# Patient Record
Sex: Female | Born: 1940 | Race: Black or African American | Hispanic: No | Smoking: Former smoker
Health system: Southern US, Community
[De-identification: ages and names within clinical notes are randomized; demographics above are authoritative.]

## PROBLEM LIST (undated history)

## (undated) DIAGNOSIS — F419 Anxiety disorder, unspecified: Secondary | ICD-10-CM

## (undated) DIAGNOSIS — E669 Obesity, unspecified: Secondary | ICD-10-CM

## (undated) DIAGNOSIS — K449 Diaphragmatic hernia without obstruction or gangrene: Secondary | ICD-10-CM

## (undated) DIAGNOSIS — R0609 Other forms of dyspnea: Secondary | ICD-10-CM

## (undated) DIAGNOSIS — E7800 Pure hypercholesterolemia, unspecified: Secondary | ICD-10-CM

## (undated) DIAGNOSIS — Z720 Tobacco use: Secondary | ICD-10-CM

## (undated) DIAGNOSIS — K5790 Diverticulosis of intestine, part unspecified, without perforation or abscess without bleeding: Secondary | ICD-10-CM

## (undated) DIAGNOSIS — R0600 Dyspnea, unspecified: Secondary | ICD-10-CM

## (undated) DIAGNOSIS — M1990 Unspecified osteoarthritis, unspecified site: Secondary | ICD-10-CM

## (undated) DIAGNOSIS — D649 Anemia, unspecified: Secondary | ICD-10-CM

## (undated) DIAGNOSIS — I10 Essential (primary) hypertension: Secondary | ICD-10-CM

## (undated) DIAGNOSIS — I4891 Unspecified atrial fibrillation: Secondary | ICD-10-CM

## (undated) HISTORY — DX: Essential (primary) hypertension: I10

## (undated) HISTORY — DX: Tobacco use: Z72.0

## (undated) HISTORY — DX: Obesity, unspecified: E66.9

## (undated) HISTORY — PX: ABDOMINAL HYSTERECTOMY: SHX81

## (undated) HISTORY — DX: Unspecified osteoarthritis, unspecified site: M19.90

## (undated) HISTORY — DX: Pure hypercholesterolemia, unspecified: E78.00

## (undated) HISTORY — DX: Other forms of dyspnea: R06.09

## (undated) HISTORY — DX: Diaphragmatic hernia without obstruction or gangrene: K44.9

## (undated) HISTORY — DX: Diverticulosis of intestine, part unspecified, without perforation or abscess without bleeding: K57.90

## (undated) HISTORY — DX: Anemia, unspecified: D64.9

## (undated) HISTORY — DX: Anxiety disorder, unspecified: F41.9

## (undated) HISTORY — DX: Dyspnea, unspecified: R06.00

---

## 2012-07-09 DIAGNOSIS — R0602 Shortness of breath: Secondary | ICD-10-CM

## 2012-09-27 ENCOUNTER — Encounter: Payer: Self-pay | Admitting: Physician Assistant

## 2012-09-27 ENCOUNTER — Other Ambulatory Visit: Payer: Self-pay | Admitting: Physician Assistant

## 2012-09-28 DIAGNOSIS — R079 Chest pain, unspecified: Secondary | ICD-10-CM

## 2012-10-11 ENCOUNTER — Encounter: Payer: Self-pay | Admitting: *Deleted

## 2012-10-16 ENCOUNTER — Encounter: Payer: PRIVATE HEALTH INSURANCE | Admitting: Physician Assistant

## 2012-11-02 ENCOUNTER — Ambulatory Visit (INDEPENDENT_AMBULATORY_CARE_PROVIDER_SITE_OTHER): Payer: Medicare Other | Admitting: Physician Assistant

## 2012-11-02 ENCOUNTER — Other Ambulatory Visit: Payer: Self-pay | Admitting: *Deleted

## 2012-11-02 ENCOUNTER — Encounter: Payer: Self-pay | Admitting: *Deleted

## 2012-11-02 ENCOUNTER — Encounter: Payer: Self-pay | Admitting: Physician Assistant

## 2012-11-02 ENCOUNTER — Telehealth: Payer: Self-pay | Admitting: Cardiology

## 2012-11-02 VITALS — BP 148/69 | HR 66 | Ht 64.0 in | Wt 244.0 lb

## 2012-11-02 DIAGNOSIS — F17200 Nicotine dependence, unspecified, uncomplicated: Secondary | ICD-10-CM

## 2012-11-02 DIAGNOSIS — R072 Precordial pain: Secondary | ICD-10-CM

## 2012-11-02 DIAGNOSIS — R0609 Other forms of dyspnea: Secondary | ICD-10-CM | POA: Insufficient documentation

## 2012-11-02 DIAGNOSIS — R002 Palpitations: Secondary | ICD-10-CM

## 2012-11-02 DIAGNOSIS — Z720 Tobacco use: Secondary | ICD-10-CM

## 2012-11-02 DIAGNOSIS — I10 Essential (primary) hypertension: Secondary | ICD-10-CM

## 2012-11-02 NOTE — Assessment & Plan Note (Signed)
Patient has since stopped smoking 

## 2012-11-02 NOTE — Telephone Encounter (Signed)
GXT Cardiolite on medications  Scheduled for 11-20-12 at [facility]

## 2012-11-02 NOTE — Progress Notes (Signed)
Primary Cardiologist: Simona Huh, MD (new)   HPI: Post hospital followup from [facility], status post evaluation for new-onset exertional chest pain. Patient presented with no prior history of heart disease. Serial cardiac markers all within NL. Echocardiogram normal, with recommendation to pursue further evaluation with an outpatient stress test.  Patient presents today reporting no change from her baseline. She continues to experience DOE, even when walking on level ground. She also complains of near daily palpitations, for which she had a negative event monitor several years ago, in [place], [place].  Of note, patient has since stopped smoking tobacco.  Allergies  Allergen Reactions  . Shellfish Allergy     Current Outpatient Prescriptions  Medication Sig Dispense Refill  . amLODipine (NORVASC) 10 MG tablet Take 10 mg by mouth daily.      Marland Kitchen aspirin 81 MG tablet Take 81 mg by mouth every other day.      . clonazePAM (KLONOPIN) 0.5 MG tablet Take 0.5 mg by mouth 2 (two) times daily as needed.      . pantoprazole (PROTONIX) 40 MG tablet Take 40 mg by mouth daily.        Past Medical History  Diagnosis Date  . Hypertension   . Osteoarthritis   . Anxiety disorder   . Hypercholesterolemia   . Obesity   . Anemia     secondary to acute on chronic GI bleed  . Hiatal hernia   . Diverticulosis   . Exertional dyspnea     Normal LVF, 2-D echo, 09/2012  . Tobacco abuse     Past Surgical History  Procedure Date  . Abdominal hysterectomy     History   Social History  . Marital Status: Married    Spouse Name: N/A    Number of Children: N/A  . Years of Education: N/A   Occupational History  . Not on file.   Social History Main Topics  . Smoking status: Former Smoker -- 0.5 packs/day for 20 years    Types: Cigarettes    Quit date: 06/22/2012  . Smokeless tobacco: Not on file  . Alcohol Use: No  . Drug Use: No  . Sexually Active: Not on file   Other Topics Concern  . Not  on file   Social History Narrative  . No narrative on file    No family history on file.  ROS: no nausea, vomiting; no fever, chills; no melena, hematochezia; no claudication  PHYSICAL EXAM: BP 148/69  Pulse 66  Ht 5\' 4"  (1.626 m)  Wt 244 lb (110.678 kg)  BMI 41.88 kg/m2 GENERAL: 71 year old female, moderately obese; NAD HEENT: NCAT, PERRLA, EOMI; sclera clear; no xanthelasma NECK: palpable bilateral carotid pulses, no bruits; no JVD; no TM LUNGS: CTA bilaterally CARDIAC: RRR (S1, S2); no significant murmurs; no rubs or gallops ABDOMEN: Protuberant EXTREMETIES: no significant peripheral edema SKIN: warm/dry; no obvious rash/lesions MUSCULOSKELETAL: no joint deformity NEURO: no focal deficit; NL affect   EKG:    ASSESSMENT & PLAN:  Exertional dyspnea Will proceed with recommended outpatient evaluation with a GXT Cardiolite, for risk stratification. Patient's persistent DOE may represent an anginal equivalent. Recent hospitalization notable for normal cardiac markers and a normal echocardiogram. Patient has no known CAD, and had a prior negative GXT Cardiolite several years ago, in [place], [place].  Palpitations We'll further evaluate with an event monitor (x1 week). Also check TSH level.  Hypertension Followed by primary M.D.  Tobacco abuse Patient has since stopped smoking    Gene Charita Lindenberger, PAC

## 2012-11-02 NOTE — Assessment & Plan Note (Signed)
Followed by primary M.D. 

## 2012-11-02 NOTE — Assessment & Plan Note (Signed)
Will proceed with recommended outpatient evaluation with a GXT Cardiolite, for risk stratification. Patient's persistent DOE may represent an anginal equivalent. Recent hospitalization notable for normal cardiac markers and a normal echocardiogram. Patient has no known CAD, and had a prior negative GXT Cardiolite several years ago, in [place], [place].

## 2012-11-02 NOTE — Assessment & Plan Note (Signed)
We'll further evaluate with an event monitor (x1 week). Also check TSH level.

## 2012-11-02 NOTE — Patient Instructions (Addendum)
Your physician recommends that you schedule a follow-up appointment in: 1 month. Your physician recommends that you continue on your current medications as directed. Please refer to the Current Medication list given to you today.  Your physician recommends that you return for lab work today for [facility]. Your physician has recommended that you wear an event monitor for 1 week. You will be contacted directly by [employer] about this monitor.Event monitors are medical devices that record the heart's electrical activity. Doctors most often [place] these monitors to diagnose arrhythmias. Arrhythmias are problems with the speed or rhythm of the heartbeat. The monitor is a small, portable device. You can wear one while you do your normal daily activities. This is usually used to diagnose what is causing palpitations/syncope (passing out). Your physician has requested that you have en exercise stress myoview. For further information please visit [email or web address] Please follow instruction sheet, as given.

## 2012-11-03 NOTE — Telephone Encounter (Signed)
No precert required 

## 2012-11-13 ENCOUNTER — Telehealth: Payer: Self-pay | Admitting: *Deleted

## 2012-11-13 NOTE — Telephone Encounter (Signed)
Left message for patient to call office.  

## 2012-11-20 DIAGNOSIS — R079 Chest pain, unspecified: Secondary | ICD-10-CM

## 2012-11-23 NOTE — Telephone Encounter (Signed)
Left message for patient to call office r/e whether or not she plans to wear heart monitor.

## 2012-11-27 ENCOUNTER — Encounter: Payer: Self-pay | Admitting: *Deleted

## 2012-11-27 NOTE — Telephone Encounter (Signed)
Left message for patient to call office.  

## 2012-11-30 NOTE — Telephone Encounter (Signed)
Unable to reach patient. No return call from patient.

## 2012-12-07 ENCOUNTER — Ambulatory Visit: Payer: Medicare Other | Admitting: Physician Assistant

## 2012-12-13 ENCOUNTER — Ambulatory Visit (INDEPENDENT_AMBULATORY_CARE_PROVIDER_SITE_OTHER): Payer: Medicare Other | Admitting: Physician Assistant

## 2012-12-13 ENCOUNTER — Encounter: Payer: Self-pay | Admitting: Physician Assistant

## 2012-12-13 VITALS — BP 150/72 | HR 79 | Ht 64.0 in | Wt 213.0 lb

## 2012-12-13 DIAGNOSIS — R002 Palpitations: Secondary | ICD-10-CM

## 2012-12-13 DIAGNOSIS — I10 Essential (primary) hypertension: Secondary | ICD-10-CM

## 2012-12-13 DIAGNOSIS — R0609 Other forms of dyspnea: Secondary | ICD-10-CM

## 2012-12-13 NOTE — Patient Instructions (Signed)
   7 day heart monitor  Office will contact with results Continue all current medications. Follow up as needed

## 2012-12-13 NOTE — Assessment & Plan Note (Signed)
Patient has agreed to proceed with our recommendation to wear an event monitor (x1 week), for further evaluation of her persistent palpitations. If this is unrevealing, then no further cardiac workup is recommended, and she can return to Dr. Diona Browner on an as needed basis. I also reported to her that her recent TSH level was normal.

## 2012-12-13 NOTE — Assessment & Plan Note (Signed)
I reviewed the results of the recent NL, adequate exercise stress test. I also reviewed her recent NL echocardiogram. Therefore, no further cardiac workup indicated.

## 2012-12-13 NOTE — Progress Notes (Signed)
Primary Cardiologist: Simona Huh, MD   HPI: Scheduled one-month followup. When last seen, I scheduled her for an exercise stress Cardiolite, event monitor (one week), and a TSH level, which was normal.   - Normal adequate (90% PMHR) GXT Cardiolite; EF 73%  She informs me today that she decided to not wear the monitor, although she did receive the device. She ended up leaving town and elected to not take it with her. Upon her return, she mailed back the device.  Clinically, she continues to report significant DOE, as well as daily palpitations.  Allergies  Allergen Reactions  . Shellfish Allergy     Current Outpatient Prescriptions  Medication Sig Dispense Refill  . amLODipine (NORVASC) 10 MG tablet Take 10 mg by mouth daily.      Marland Kitchen aspirin 81 MG tablet Take 81 mg by mouth every other day.      . clonazePAM (KLONOPIN) 0.5 MG tablet Take 0.5 mg by mouth 2 (two) times daily as needed.      . pantoprazole (PROTONIX) 40 MG tablet Take 40 mg by mouth daily.        Past Medical History  Diagnosis Date  . Hypertension   . Osteoarthritis   . Anxiety disorder   . Hypercholesterolemia   . Obesity   . Anemia     secondary to acute on chronic GI bleed  . Hiatal hernia   . Diverticulosis   . Exertional dyspnea     Normal LVF, 2-D echo, 09/2012  . Tobacco abuse     Past Surgical History  Procedure Date  . Abdominal hysterectomy     History   Social History  . Marital Status: Married    Spouse Name: N/A    Number of Children: N/A  . Years of Education: N/A   Occupational History  . Not on file.   Social History Main Topics  . Smoking status: Former Smoker -- 0.5 packs/day for 20 years    Types: Cigarettes    Quit date: 06/22/2012  . Smokeless tobacco: Not on file  . Alcohol Use: No  . Drug Use: No  . Sexually Active: Not on file   Other Topics Concern  . Not on file   Social History Narrative  . No narrative on file    No family history on file.  ROS: no  nausea, vomiting; no fever, chills; no melena, hematochezia; no claudication  PHYSICAL EXAM: BP 150/72  Pulse 79  Ht 5\' 4"  (1.626 m)  Wt 213 lb (96.616 kg)  BMI 36.56 kg/m2 GENERAL: 72 year old female, moderately obese; NAD  HEENT: NCAT, PERRLA, EOMI; sclera clear; no xanthelasma  NECK: palpable bilateral carotid pulses, no bruits; no JVD; no TM  LUNGS: CTA bilaterally  CARDIAC: RRR (S1, S2); no significant murmurs; no rubs or gallops  ABDOMEN: Protuberant  EXTREMETIES: no significant peripheral edema  SKIN: warm/dry; no obvious rash/lesions  MUSCULOSKELETAL: no joint deformity  NEURO: no focal deficit; NL affect    EKG:    ASSESSMENT & PLAN:  Palpitations Patient has agreed to proceed with our recommendation to wear an event monitor (x1 week), for further evaluation of her persistent palpitations. If this is unrevealing, then no further cardiac workup is recommended, and she can return to Dr. Diona Browner on an as needed basis. I also reported to her that her recent TSH level was normal.  Exertional dyspnea I reviewed the results of the recent NL, adequate exercise stress test. I also reviewed her recent NL echocardiogram. Therefore,  no further cardiac workup indicated.  Hypertension Followed by primary M.D.    Gene Andrea [name], PAC

## 2012-12-13 NOTE — Assessment & Plan Note (Signed)
Followed by primary M.D. 

## 2012-12-15 ENCOUNTER — Other Ambulatory Visit: Payer: Self-pay | Admitting: *Deleted

## 2012-12-15 DIAGNOSIS — R002 Palpitations: Secondary | ICD-10-CM

## 2012-12-20 DIAGNOSIS — R002 Palpitations: Secondary | ICD-10-CM

## 2012-12-29 ENCOUNTER — Telehealth: Payer: Self-pay | Admitting: *Deleted

## 2012-12-29 MED ORDER — METOPROLOL TARTRATE 25 MG PO TABS: 25.0000 mg | ORAL_TABLET | Freq: Two times a day (BID) | ORAL | Status: DC

## 2012-12-29 NOTE — Telephone Encounter (Signed)
E-CARDIO - End of Service Report - per Gene Serpe, PA - NSR w/ brief atrial run ~150 bpm.  Recommend starting Lopressor 25mg  twice a day  & follow up clinic in 2 weeks.    Patient notified of above.  New rx sent to [facility] / [place].  OV scheduled with Dr. Shirlee Latch for 3/19.  (Also put on wait list for earlier OV with MD).

## 2013-02-01 DIAGNOSIS — D509 Iron deficiency anemia, unspecified: Secondary | ICD-10-CM

## 2013-02-07 ENCOUNTER — Other Ambulatory Visit (INDEPENDENT_AMBULATORY_CARE_PROVIDER_SITE_OTHER): Payer: Self-pay | Admitting: *Deleted

## 2013-02-07 ENCOUNTER — Ambulatory Visit: Payer: Medicare Other | Admitting: Cardiology

## 2013-02-07 DIAGNOSIS — D509 Iron deficiency anemia, unspecified: Secondary | ICD-10-CM

## 2013-02-08 ENCOUNTER — Encounter (HOSPITAL_COMMUNITY): Payer: Self-pay | Admitting: Pharmacy Technician

## 2013-02-14 ENCOUNTER — Encounter (INDEPENDENT_AMBULATORY_CARE_PROVIDER_SITE_OTHER): Payer: Self-pay

## 2013-02-15 ENCOUNTER — Encounter: Payer: Medicare Other | Admitting: Internal Medicine

## 2013-02-15 DIAGNOSIS — F329 Major depressive disorder, single episode, unspecified: Secondary | ICD-10-CM

## 2013-02-15 DIAGNOSIS — J40 Bronchitis, not specified as acute or chronic: Secondary | ICD-10-CM

## 2013-02-15 DIAGNOSIS — D509 Iron deficiency anemia, unspecified: Secondary | ICD-10-CM

## 2013-02-15 DIAGNOSIS — J329 Chronic sinusitis, unspecified: Secondary | ICD-10-CM

## 2013-02-19 ENCOUNTER — Encounter (HOSPITAL_COMMUNITY): Payer: Self-pay | Admitting: *Deleted

## 2013-02-19 ENCOUNTER — Encounter (HOSPITAL_COMMUNITY)
Admission: RE | Disposition: A | Discharge status: Home / Self Care | Payer: Self-pay | Source: Ambulatory Visit | Attending: Internal Medicine

## 2013-02-19 ENCOUNTER — Ambulatory Visit (HOSPITAL_COMMUNITY)
Admission: RE | Admit: 2013-02-19 | Discharge: 2013-02-19 | Disposition: A | Discharge status: Home / Self Care | Payer: Medicare Other | Source: Ambulatory Visit | Attending: Internal Medicine | Admitting: Internal Medicine

## 2013-02-19 DIAGNOSIS — I10 Essential (primary) hypertension: Secondary | ICD-10-CM | POA: Insufficient documentation

## 2013-02-19 DIAGNOSIS — Z86010 Personal history of colonic polyps: Secondary | ICD-10-CM

## 2013-02-19 DIAGNOSIS — D509 Iron deficiency anemia, unspecified: Secondary | ICD-10-CM | POA: Insufficient documentation

## 2013-02-19 DIAGNOSIS — K2960 Other gastritis without bleeding: Secondary | ICD-10-CM

## 2013-02-19 DIAGNOSIS — K319 Disease of stomach and duodenum, unspecified: Secondary | ICD-10-CM

## 2013-02-19 DIAGNOSIS — Z9889 Other specified postprocedural states: Secondary | ICD-10-CM

## 2013-02-19 DIAGNOSIS — K922 Gastrointestinal hemorrhage, unspecified: Secondary | ICD-10-CM

## 2013-02-19 HISTORY — PX: GIVENS CAPSULE STUDY: SHX5432

## 2013-02-19 SURGERY — IMAGING PROCEDURE, GI TRACT, INTRALUMINAL, VIA CAPSULE

## 2013-02-19 NOTE — H&P (Signed)
Andrea [name] is an 72 y.o. female.   Chief Complaint: Patient is here for small bowel given capsule study. HPI: Patient is 72 year old Caucasian female with history of iron deficiency anemia and GI bleed. She had EGD and colonoscopy by Dr. Leota Jacobsen in August 2013. EGD revealed Schatzki's ring but no bleeding lesions identified. She had 8 polyps removed from her colon once again no bleeding source was identified. Last month she was given transfusion for hemoglobin of 6.4 g. She was evaluated by Dr. Earma Reading and sent for this study to complete  evaluation for iron deficiency anemia. She has history of GI bleed.  Past Medical History  Diagnosis Date  . Hypertension   . Osteoarthritis   . Anxiety disorder   . Hypercholesterolemia   . Obesity   . Anemia     secondary to acute on chronic GI bleed  . Hiatal hernia   . Diverticulosis   . Exertional dyspnea     Normal LVF, 2-D echo, 09/2012  . Tobacco abuse     Past Surgical History  Procedure Laterality Date  . Abdominal hysterectomy      History reviewed. No pertinent family history. Social History:  reports that she quit smoking about 7 months ago. Her smoking use included Cigarettes. She has a 10 pack-year smoking history. She does not have any smokeless tobacco history on file. She reports that she does not drink alcohol or use illicit drugs.  Allergies:  Allergies  Allergen Reactions  . Shellfish Allergy Hives, Itching and Swelling    No prescriptions prior to admission    No results found for this or any previous visit (from the past 48 hour(s)). No results found.  ROS  Blood pressure 169/91, pulse 59, temperature 99.1 F (37.3 C), temperature source Oral, resp. rate 18, height 5\' 4"  (1.626 m), weight 210 lb (95.255 kg), SpO2 96.00%. Physical Exam   Assessment/Plan Iron deficiency anemia and history of GI bleed. No bleeding lesions found on EGD and colonoscopy of August 2013. Will proceed with small bowel  given capsule study.  Coyt Govoni U 02/19/2013, 12:36 PM

## 2013-02-20 ENCOUNTER — Encounter (HOSPITAL_COMMUNITY): Payer: Self-pay | Admitting: Internal Medicine

## 2013-02-25 NOTE — Op Note (Signed)
Small Bowel Givens Capsule Study Procedure date:  02/19/2013  Referring Provider:  Dr. Scotty Court, MD PCP:  Dr. Ignatius Specking., MD  Indication for procedure:  Patient is 72 year old female with history of iron deficiency anemia and GI bleed and no source was identified on EGD of August and November 2013 and colonoscopy of August 2013. These studies were performed at [facility] in 8 in [place]. In February 2014 her hemoglobin dropped to 6.4 grams and she required transfusion.    Findings:  Patient able to swallow given capsule without any difficulty. This is an and 8 hour study. First image of oral cavity at 3 minutes and 4 seconds. Single erosion noted at gastric body without stigmata of bleed. 4 submucosal lesions noted; these are best seen on frames at 1 hour 3 minutes and 12 seconds; 1 hour 3 minutes and 37 seconds; 1 hour 19 minutes and 25 seconds and the fourth one was at 1 hour 22 minutes and 37 seconds and involved half of the circumference.  First Gastric image:  18 secs. First Duodenal image: 16 m and 44 secs. First Ileo-Cecal Valve image: 2 h  41 m and 10 secs First Cecal image: 2 h 41 m and 11 secs Gastric Passage time:  13 m and 14 secs Small Bowel Passage time:  2 h 36 m  Summary & Recommendations: Single erosion at gastric body without stigmata of bleed. No lesion identified involving small bowel mucosa. However four submucosal lesions noted as above. These lesions are felt to be incidental but should be further evaluated with CT enterography. These findings were reviewed with the patient over the phone and my office will arrange for CT enterography. Should patient experience another episode of overt bleeding she should undergo urgent bleeding scan.   CC; Dr. Earma Reading, MD

## 2013-03-10 ENCOUNTER — Telehealth (INDEPENDENT_AMBULATORY_CARE_PROVIDER_SITE_OTHER): Payer: Self-pay | Admitting: Internal Medicine

## 2013-03-10 NOTE — Telephone Encounter (Signed)
No answer Will call patient tomorrow

## 2013-03-16 ENCOUNTER — Ambulatory Visit: Payer: Medicare Other | Admitting: Cardiology

## 2013-03-26 ENCOUNTER — Encounter (INDEPENDENT_AMBULATORY_CARE_PROVIDER_SITE_OTHER): Payer: Self-pay

## 2013-03-28 ENCOUNTER — Encounter (INDEPENDENT_AMBULATORY_CARE_PROVIDER_SITE_OTHER): Payer: Self-pay

## 2013-03-29 ENCOUNTER — Encounter: Payer: Medicare Other | Admitting: Internal Medicine

## 2013-03-29 DIAGNOSIS — D649 Anemia, unspecified: Secondary | ICD-10-CM

## 2013-03-29 DIAGNOSIS — D509 Iron deficiency anemia, unspecified: Secondary | ICD-10-CM

## 2013-04-04 DIAGNOSIS — D509 Iron deficiency anemia, unspecified: Secondary | ICD-10-CM

## 2013-04-10 ENCOUNTER — Ambulatory Visit (INDEPENDENT_AMBULATORY_CARE_PROVIDER_SITE_OTHER): Payer: Medicare Other | Admitting: Internal Medicine

## 2013-04-13 DIAGNOSIS — D509 Iron deficiency anemia, unspecified: Secondary | ICD-10-CM

## 2013-04-20 DIAGNOSIS — D509 Iron deficiency anemia, unspecified: Secondary | ICD-10-CM

## 2013-05-29 DIAGNOSIS — R05 Cough: Secondary | ICD-10-CM

## 2013-05-29 DIAGNOSIS — D509 Iron deficiency anemia, unspecified: Secondary | ICD-10-CM

## 2013-05-29 DIAGNOSIS — K219 Gastro-esophageal reflux disease without esophagitis: Secondary | ICD-10-CM

## 2013-05-29 DIAGNOSIS — K449 Diaphragmatic hernia without obstruction or gangrene: Secondary | ICD-10-CM

## 2013-06-07 DIAGNOSIS — D509 Iron deficiency anemia, unspecified: Secondary | ICD-10-CM

## 2013-07-24 ENCOUNTER — Ambulatory Visit (INDEPENDENT_AMBULATORY_CARE_PROVIDER_SITE_OTHER): Payer: Medicare Other | Admitting: Internal Medicine

## 2013-07-31 DIAGNOSIS — K449 Diaphragmatic hernia without obstruction or gangrene: Secondary | ICD-10-CM

## 2013-07-31 DIAGNOSIS — D509 Iron deficiency anemia, unspecified: Secondary | ICD-10-CM

## 2013-07-31 DIAGNOSIS — R799 Abnormal finding of blood chemistry, unspecified: Secondary | ICD-10-CM

## 2013-10-30 DIAGNOSIS — R42 Dizziness and giddiness: Secondary | ICD-10-CM

## 2013-10-30 DIAGNOSIS — K449 Diaphragmatic hernia without obstruction or gangrene: Secondary | ICD-10-CM

## 2013-10-30 DIAGNOSIS — Z23 Encounter for immunization: Secondary | ICD-10-CM

## 2013-10-30 DIAGNOSIS — D509 Iron deficiency anemia, unspecified: Secondary | ICD-10-CM

## 2014-03-25 DIAGNOSIS — D500 Iron deficiency anemia secondary to blood loss (chronic): Secondary | ICD-10-CM | POA: Diagnosis present

## 2014-07-08 DIAGNOSIS — G4730 Sleep apnea, unspecified: Secondary | ICD-10-CM

## 2014-07-08 HISTORY — DX: Sleep apnea, unspecified: G47.30

## 2014-10-07 DIAGNOSIS — I517 Cardiomegaly: Secondary | ICD-10-CM

## 2014-10-07 HISTORY — DX: Cardiomegaly: I51.7

## 2015-11-26 DIAGNOSIS — M6281 Muscle weakness (generalized): Secondary | ICD-10-CM | POA: Diagnosis not present

## 2015-11-26 DIAGNOSIS — M160 Bilateral primary osteoarthritis of hip: Secondary | ICD-10-CM | POA: Diagnosis not present

## 2015-11-26 DIAGNOSIS — R2689 Other abnormalities of gait and mobility: Secondary | ICD-10-CM | POA: Diagnosis not present

## 2015-11-27 DIAGNOSIS — M160 Bilateral primary osteoarthritis of hip: Secondary | ICD-10-CM | POA: Diagnosis not present

## 2015-11-27 DIAGNOSIS — R2689 Other abnormalities of gait and mobility: Secondary | ICD-10-CM | POA: Diagnosis not present

## 2015-11-27 DIAGNOSIS — M6281 Muscle weakness (generalized): Secondary | ICD-10-CM | POA: Diagnosis not present

## 2015-12-02 DIAGNOSIS — M6281 Muscle weakness (generalized): Secondary | ICD-10-CM | POA: Diagnosis not present

## 2015-12-02 DIAGNOSIS — R2689 Other abnormalities of gait and mobility: Secondary | ICD-10-CM | POA: Diagnosis not present

## 2015-12-02 DIAGNOSIS — M160 Bilateral primary osteoarthritis of hip: Secondary | ICD-10-CM | POA: Diagnosis not present

## 2015-12-04 DIAGNOSIS — M6281 Muscle weakness (generalized): Secondary | ICD-10-CM | POA: Diagnosis not present

## 2015-12-04 DIAGNOSIS — M160 Bilateral primary osteoarthritis of hip: Secondary | ICD-10-CM | POA: Diagnosis not present

## 2015-12-04 DIAGNOSIS — R2689 Other abnormalities of gait and mobility: Secondary | ICD-10-CM | POA: Diagnosis not present

## 2015-12-10 DIAGNOSIS — M6281 Muscle weakness (generalized): Secondary | ICD-10-CM | POA: Diagnosis not present

## 2015-12-10 DIAGNOSIS — M160 Bilateral primary osteoarthritis of hip: Secondary | ICD-10-CM | POA: Diagnosis not present

## 2015-12-10 DIAGNOSIS — R2689 Other abnormalities of gait and mobility: Secondary | ICD-10-CM | POA: Diagnosis not present

## 2015-12-11 DIAGNOSIS — M6281 Muscle weakness (generalized): Secondary | ICD-10-CM | POA: Diagnosis not present

## 2015-12-11 DIAGNOSIS — R2689 Other abnormalities of gait and mobility: Secondary | ICD-10-CM | POA: Diagnosis not present

## 2015-12-11 DIAGNOSIS — M160 Bilateral primary osteoarthritis of hip: Secondary | ICD-10-CM | POA: Diagnosis not present

## 2015-12-16 DIAGNOSIS — M160 Bilateral primary osteoarthritis of hip: Secondary | ICD-10-CM | POA: Diagnosis not present

## 2015-12-16 DIAGNOSIS — M6281 Muscle weakness (generalized): Secondary | ICD-10-CM | POA: Diagnosis not present

## 2015-12-16 DIAGNOSIS — R2689 Other abnormalities of gait and mobility: Secondary | ICD-10-CM | POA: Diagnosis not present

## 2015-12-18 DIAGNOSIS — M160 Bilateral primary osteoarthritis of hip: Secondary | ICD-10-CM | POA: Diagnosis not present

## 2015-12-18 DIAGNOSIS — R2689 Other abnormalities of gait and mobility: Secondary | ICD-10-CM | POA: Diagnosis not present

## 2015-12-18 DIAGNOSIS — M6281 Muscle weakness (generalized): Secondary | ICD-10-CM | POA: Diagnosis not present

## 2015-12-23 DIAGNOSIS — M160 Bilateral primary osteoarthritis of hip: Secondary | ICD-10-CM | POA: Diagnosis not present

## 2015-12-23 DIAGNOSIS — M6281 Muscle weakness (generalized): Secondary | ICD-10-CM | POA: Diagnosis not present

## 2015-12-23 DIAGNOSIS — R2689 Other abnormalities of gait and mobility: Secondary | ICD-10-CM | POA: Diagnosis not present

## 2015-12-25 DIAGNOSIS — M160 Bilateral primary osteoarthritis of hip: Secondary | ICD-10-CM | POA: Diagnosis not present

## 2015-12-25 DIAGNOSIS — R2689 Other abnormalities of gait and mobility: Secondary | ICD-10-CM | POA: Diagnosis not present

## 2015-12-25 DIAGNOSIS — M6281 Muscle weakness (generalized): Secondary | ICD-10-CM | POA: Diagnosis not present

## 2016-01-13 DIAGNOSIS — M160 Bilateral primary osteoarthritis of hip: Secondary | ICD-10-CM | POA: Diagnosis not present

## 2016-01-13 DIAGNOSIS — R2689 Other abnormalities of gait and mobility: Secondary | ICD-10-CM | POA: Diagnosis not present

## 2016-01-13 DIAGNOSIS — M6281 Muscle weakness (generalized): Secondary | ICD-10-CM | POA: Diagnosis not present

## 2016-01-14 DIAGNOSIS — D500 Iron deficiency anemia secondary to blood loss (chronic): Secondary | ICD-10-CM | POA: Diagnosis not present

## 2016-01-14 DIAGNOSIS — K210 Gastro-esophageal reflux disease with esophagitis: Secondary | ICD-10-CM | POA: Diagnosis not present

## 2016-01-14 DIAGNOSIS — I499 Cardiac arrhythmia, unspecified: Secondary | ICD-10-CM | POA: Diagnosis not present

## 2016-01-14 DIAGNOSIS — I10 Essential (primary) hypertension: Secondary | ICD-10-CM | POA: Diagnosis not present

## 2016-01-14 DIAGNOSIS — M130 Polyarthritis, unspecified: Secondary | ICD-10-CM | POA: Diagnosis not present

## 2016-01-15 DIAGNOSIS — R9431 Abnormal electrocardiogram [ECG] [EKG]: Secondary | ICD-10-CM | POA: Diagnosis not present

## 2016-01-15 DIAGNOSIS — Z8679 Personal history of other diseases of the circulatory system: Secondary | ICD-10-CM | POA: Diagnosis not present

## 2016-01-15 DIAGNOSIS — D508 Other iron deficiency anemias: Secondary | ICD-10-CM | POA: Diagnosis not present

## 2016-01-15 DIAGNOSIS — R002 Palpitations: Secondary | ICD-10-CM | POA: Diagnosis not present

## 2016-01-20 DIAGNOSIS — R2689 Other abnormalities of gait and mobility: Secondary | ICD-10-CM | POA: Diagnosis not present

## 2016-01-20 DIAGNOSIS — M160 Bilateral primary osteoarthritis of hip: Secondary | ICD-10-CM | POA: Diagnosis not present

## 2016-01-20 DIAGNOSIS — M6281 Muscle weakness (generalized): Secondary | ICD-10-CM | POA: Diagnosis not present

## 2016-01-27 DIAGNOSIS — M160 Bilateral primary osteoarthritis of hip: Secondary | ICD-10-CM | POA: Diagnosis not present

## 2016-01-27 DIAGNOSIS — M6281 Muscle weakness (generalized): Secondary | ICD-10-CM | POA: Diagnosis not present

## 2016-01-27 DIAGNOSIS — R2689 Other abnormalities of gait and mobility: Secondary | ICD-10-CM | POA: Diagnosis not present

## 2016-01-29 DIAGNOSIS — M6281 Muscle weakness (generalized): Secondary | ICD-10-CM | POA: Diagnosis not present

## 2016-01-29 DIAGNOSIS — M160 Bilateral primary osteoarthritis of hip: Secondary | ICD-10-CM | POA: Diagnosis not present

## 2016-01-29 DIAGNOSIS — R2689 Other abnormalities of gait and mobility: Secondary | ICD-10-CM | POA: Diagnosis not present

## 2016-02-06 DIAGNOSIS — M6281 Muscle weakness (generalized): Secondary | ICD-10-CM | POA: Diagnosis not present

## 2016-02-06 DIAGNOSIS — R2689 Other abnormalities of gait and mobility: Secondary | ICD-10-CM | POA: Diagnosis not present

## 2016-02-06 DIAGNOSIS — M160 Bilateral primary osteoarthritis of hip: Secondary | ICD-10-CM | POA: Diagnosis not present

## 2016-02-10 DIAGNOSIS — M6281 Muscle weakness (generalized): Secondary | ICD-10-CM | POA: Diagnosis not present

## 2016-02-10 DIAGNOSIS — R2689 Other abnormalities of gait and mobility: Secondary | ICD-10-CM | POA: Diagnosis not present

## 2016-02-10 DIAGNOSIS — M160 Bilateral primary osteoarthritis of hip: Secondary | ICD-10-CM | POA: Diagnosis not present

## 2016-02-12 DIAGNOSIS — R2689 Other abnormalities of gait and mobility: Secondary | ICD-10-CM | POA: Diagnosis not present

## 2016-02-12 DIAGNOSIS — M6281 Muscle weakness (generalized): Secondary | ICD-10-CM | POA: Diagnosis not present

## 2016-02-12 DIAGNOSIS — M160 Bilateral primary osteoarthritis of hip: Secondary | ICD-10-CM | POA: Diagnosis not present

## 2016-02-19 DIAGNOSIS — M160 Bilateral primary osteoarthritis of hip: Secondary | ICD-10-CM | POA: Diagnosis not present

## 2016-02-19 DIAGNOSIS — R2689 Other abnormalities of gait and mobility: Secondary | ICD-10-CM | POA: Diagnosis not present

## 2016-02-19 DIAGNOSIS — M6281 Muscle weakness (generalized): Secondary | ICD-10-CM | POA: Diagnosis not present

## 2016-02-24 DIAGNOSIS — M6281 Muscle weakness (generalized): Secondary | ICD-10-CM | POA: Diagnosis not present

## 2016-02-24 DIAGNOSIS — R2689 Other abnormalities of gait and mobility: Secondary | ICD-10-CM | POA: Diagnosis not present

## 2016-02-24 DIAGNOSIS — M160 Bilateral primary osteoarthritis of hip: Secondary | ICD-10-CM | POA: Diagnosis not present

## 2016-02-26 DIAGNOSIS — M160 Bilateral primary osteoarthritis of hip: Secondary | ICD-10-CM | POA: Diagnosis not present

## 2016-02-26 DIAGNOSIS — R2689 Other abnormalities of gait and mobility: Secondary | ICD-10-CM | POA: Diagnosis not present

## 2016-02-26 DIAGNOSIS — M6281 Muscle weakness (generalized): Secondary | ICD-10-CM | POA: Diagnosis not present

## 2016-03-02 DIAGNOSIS — R2689 Other abnormalities of gait and mobility: Secondary | ICD-10-CM | POA: Diagnosis not present

## 2016-03-02 DIAGNOSIS — M6281 Muscle weakness (generalized): Secondary | ICD-10-CM | POA: Diagnosis not present

## 2016-03-02 DIAGNOSIS — M160 Bilateral primary osteoarthritis of hip: Secondary | ICD-10-CM | POA: Diagnosis not present

## 2016-03-04 DIAGNOSIS — M6281 Muscle weakness (generalized): Secondary | ICD-10-CM | POA: Diagnosis not present

## 2016-03-04 DIAGNOSIS — R2689 Other abnormalities of gait and mobility: Secondary | ICD-10-CM | POA: Diagnosis not present

## 2016-03-04 DIAGNOSIS — M160 Bilateral primary osteoarthritis of hip: Secondary | ICD-10-CM | POA: Diagnosis not present

## 2016-03-17 DIAGNOSIS — K210 Gastro-esophageal reflux disease with esophagitis: Secondary | ICD-10-CM | POA: Diagnosis not present

## 2016-03-17 DIAGNOSIS — D500 Iron deficiency anemia secondary to blood loss (chronic): Secondary | ICD-10-CM | POA: Diagnosis not present

## 2016-03-24 DIAGNOSIS — D500 Iron deficiency anemia secondary to blood loss (chronic): Secondary | ICD-10-CM | POA: Diagnosis not present

## 2016-03-31 DIAGNOSIS — D500 Iron deficiency anemia secondary to blood loss (chronic): Secondary | ICD-10-CM | POA: Diagnosis not present

## 2016-04-21 DIAGNOSIS — H5713 Ocular pain, bilateral: Secondary | ICD-10-CM | POA: Diagnosis not present

## 2016-04-21 DIAGNOSIS — H01021 Squamous blepharitis right upper eyelid: Secondary | ICD-10-CM | POA: Diagnosis not present

## 2016-04-21 DIAGNOSIS — H1013 Acute atopic conjunctivitis, bilateral: Secondary | ICD-10-CM | POA: Diagnosis not present

## 2016-04-23 DIAGNOSIS — Z87891 Personal history of nicotine dependence: Secondary | ICD-10-CM | POA: Diagnosis not present

## 2016-04-23 DIAGNOSIS — J309 Allergic rhinitis, unspecified: Secondary | ICD-10-CM | POA: Diagnosis not present

## 2016-04-23 DIAGNOSIS — R42 Dizziness and giddiness: Secondary | ICD-10-CM | POA: Diagnosis not present

## 2016-05-17 DIAGNOSIS — G4734 Idiopathic sleep related nonobstructive alveolar hypoventilation: Secondary | ICD-10-CM | POA: Diagnosis not present

## 2016-05-17 DIAGNOSIS — M25512 Pain in left shoulder: Secondary | ICD-10-CM | POA: Diagnosis not present

## 2016-05-17 DIAGNOSIS — M47812 Spondylosis without myelopathy or radiculopathy, cervical region: Secondary | ICD-10-CM | POA: Diagnosis not present

## 2016-05-17 DIAGNOSIS — R0602 Shortness of breath: Secondary | ICD-10-CM | POA: Diagnosis not present

## 2016-05-17 DIAGNOSIS — M19012 Primary osteoarthritis, left shoulder: Secondary | ICD-10-CM | POA: Diagnosis not present

## 2016-05-24 DIAGNOSIS — M25512 Pain in left shoulder: Secondary | ICD-10-CM | POA: Diagnosis not present

## 2016-05-24 DIAGNOSIS — M19012 Primary osteoarthritis, left shoulder: Secondary | ICD-10-CM | POA: Diagnosis not present

## 2016-06-24 DIAGNOSIS — Z6838 Body mass index (BMI) 38.0-38.9, adult: Secondary | ICD-10-CM | POA: Diagnosis not present

## 2016-06-24 DIAGNOSIS — Z299 Encounter for prophylactic measures, unspecified: Secondary | ICD-10-CM | POA: Diagnosis not present

## 2016-06-24 DIAGNOSIS — Z0000 Encounter for general adult medical examination without abnormal findings: Secondary | ICD-10-CM | POA: Diagnosis not present

## 2016-06-24 DIAGNOSIS — R5383 Other fatigue: Secondary | ICD-10-CM | POA: Diagnosis not present

## 2016-06-24 DIAGNOSIS — Z79899 Other long term (current) drug therapy: Secondary | ICD-10-CM | POA: Diagnosis not present

## 2016-06-24 DIAGNOSIS — Z1211 Encounter for screening for malignant neoplasm of colon: Secondary | ICD-10-CM | POA: Diagnosis not present

## 2016-06-24 DIAGNOSIS — Z7189 Other specified counseling: Secondary | ICD-10-CM | POA: Diagnosis not present

## 2016-06-24 DIAGNOSIS — Z1389 Encounter for screening for other disorder: Secondary | ICD-10-CM | POA: Diagnosis not present

## 2016-08-11 ENCOUNTER — Encounter: Payer: Self-pay | Admitting: *Deleted

## 2016-08-12 ENCOUNTER — Encounter (HOSPITAL_COMMUNITY): Payer: Self-pay | Admitting: Cardiology

## 2016-08-12 ENCOUNTER — Ambulatory Visit (INDEPENDENT_AMBULATORY_CARE_PROVIDER_SITE_OTHER): Payer: Medicare Other | Admitting: Cardiovascular Disease

## 2016-08-12 ENCOUNTER — Encounter: Payer: Self-pay | Admitting: Cardiovascular Disease

## 2016-08-12 ENCOUNTER — Inpatient Hospital Stay (HOSPITAL_COMMUNITY)
Admission: EM | Admit: 2016-08-12 | Discharge: 2016-08-14 | DRG: 309 | Disposition: A | Discharge status: Home / Self Care | Payer: Medicare Other | Attending: Internal Medicine | Admitting: Internal Medicine

## 2016-08-12 ENCOUNTER — Observation Stay (HOSPITAL_COMMUNITY): Payer: Medicare Other

## 2016-08-12 VITALS — BP 138/80 | HR 140 | Ht 63.0 in | Wt 238.0 lb

## 2016-08-12 DIAGNOSIS — Z87891 Personal history of nicotine dependence: Secondary | ICD-10-CM

## 2016-08-12 DIAGNOSIS — Z9981 Dependence on supplemental oxygen: Secondary | ICD-10-CM | POA: Diagnosis not present

## 2016-08-12 DIAGNOSIS — R0609 Other forms of dyspnea: Secondary | ICD-10-CM | POA: Diagnosis not present

## 2016-08-12 DIAGNOSIS — I480 Paroxysmal atrial fibrillation: Secondary | ICD-10-CM | POA: Diagnosis not present

## 2016-08-12 DIAGNOSIS — R002 Palpitations: Secondary | ICD-10-CM | POA: Diagnosis present

## 2016-08-12 DIAGNOSIS — D509 Iron deficiency anemia, unspecified: Secondary | ICD-10-CM | POA: Diagnosis present

## 2016-08-12 DIAGNOSIS — I4891 Unspecified atrial fibrillation: Principal | ICD-10-CM | POA: Diagnosis present

## 2016-08-12 DIAGNOSIS — Z862 Personal history of diseases of the blood and blood-forming organs and certain disorders involving the immune mechanism: Secondary | ICD-10-CM

## 2016-08-12 DIAGNOSIS — Z8719 Personal history of other diseases of the digestive system: Secondary | ICD-10-CM

## 2016-08-12 DIAGNOSIS — F419 Anxiety disorder, unspecified: Secondary | ICD-10-CM | POA: Diagnosis present

## 2016-08-12 DIAGNOSIS — I10 Essential (primary) hypertension: Secondary | ICD-10-CM

## 2016-08-12 DIAGNOSIS — Z6841 Body Mass Index (BMI) 40.0 and over, adult: Secondary | ICD-10-CM

## 2016-08-12 DIAGNOSIS — Z720 Tobacco use: Secondary | ICD-10-CM | POA: Diagnosis present

## 2016-08-12 DIAGNOSIS — I4821 Permanent atrial fibrillation: Secondary | ICD-10-CM | POA: Diagnosis present

## 2016-08-12 DIAGNOSIS — R0600 Dyspnea, unspecified: Secondary | ICD-10-CM | POA: Diagnosis not present

## 2016-08-12 DIAGNOSIS — E669 Obesity, unspecified: Secondary | ICD-10-CM | POA: Diagnosis present

## 2016-08-12 DIAGNOSIS — Z8249 Family history of ischemic heart disease and other diseases of the circulatory system: Secondary | ICD-10-CM

## 2016-08-12 DIAGNOSIS — R0602 Shortness of breath: Secondary | ICD-10-CM | POA: Diagnosis not present

## 2016-08-12 DIAGNOSIS — E7800 Pure hypercholesterolemia, unspecified: Secondary | ICD-10-CM | POA: Diagnosis present

## 2016-08-12 LAB — COMPREHENSIVE METABOLIC PANEL
ALK PHOS: 122 U/L (ref 38–126)
ALT: 16 U/L (ref 14–54)
ANION GAP: 2 — AB (ref 5–15)
AST: 16 U/L (ref 15–41)
Albumin: 4.1 g/dL (ref 3.5–5.0)
BILIRUBIN TOTAL: 0.7 mg/dL (ref 0.3–1.2)
BUN: 16 mg/dL (ref 6–20)
CALCIUM: 8.7 mg/dL — AB (ref 8.9–10.3)
CO2: 25 mmol/L (ref 22–32)
CREATININE: 0.87 mg/dL (ref 0.44–1.00)
Chloride: 111 mmol/L (ref 101–111)
GFR calc non Af Amer: >60 mL/min (ref >60)
Glucose, Bld: 120 mg/dL — ABNORMAL HIGH (ref 65–99)
Potassium: 4 mmol/L (ref 3.5–5.1)
Sodium: 138 mmol/L (ref 135–145)
TOTAL PROTEIN: 7 g/dL (ref 6.5–8.1)

## 2016-08-12 LAB — CBC WITH DIFFERENTIAL/PLATELET
Basophils Absolute: 0 10*3/uL (ref 0.0–0.1)
Basophils Relative: 1 %
Eosinophils Absolute: 0.1 10*3/uL (ref 0.0–0.7)
Eosinophils Relative: 2 %
HEMATOCRIT: 40.7 % (ref 36.0–46.0)
HEMOGLOBIN: 13.1 g/dL (ref 12.0–15.0)
LYMPHS ABS: 1.1 10*3/uL (ref 0.7–4.0)
LYMPHS PCT: 15 %
MCH: 31.3 pg (ref 26.0–34.0)
MCHC: 32.2 g/dL (ref 30.0–36.0)
MCV: 97.4 fL (ref 78.0–100.0)
MONOS PCT: 8 %
Monocytes Absolute: 0.6 10*3/uL (ref 0.1–1.0)
NEUTROS ABS: 5.6 10*3/uL (ref 1.7–7.7)
NEUTROS PCT: 74 %
Platelets: 278 10*3/uL (ref 150–400)
RBC: 4.18 MIL/uL (ref 3.87–5.11)
RDW: 14.1 % (ref 11.5–15.5)
WBC: 7.5 10*3/uL (ref 4.0–10.5)

## 2016-08-12 LAB — BRAIN NATRIURETIC PEPTIDE: B NATRIURETIC PEPTIDE 5: 173 pg/mL — AB (ref 0.0–100.0)

## 2016-08-12 LAB — TROPONIN I

## 2016-08-12 LAB — MAGNESIUM: Magnesium: 1.9 mg/dL (ref 1.7–2.4)

## 2016-08-12 LAB — TSH: TSH: 1.303 u[IU]/mL (ref 0.350–4.500)

## 2016-08-12 MED ORDER — SODIUM CHLORIDE 0.9% FLUSH
3.0000 mL | Freq: Two times a day (BID) | INTRAVENOUS | Status: DC
  Administered 2016-08-12 – 2016-08-14 (×3): 3 mL via INTRAVENOUS

## 2016-08-12 MED ORDER — FLUTICASONE FUROATE-VILANTEROL 100-25 MCG/INH IN AEPB
1.0000 | INHALATION_SPRAY | Freq: Every day | RESPIRATORY_TRACT | Status: DC
  Administered 2016-08-13 – 2016-08-14 (×2): 1 via RESPIRATORY_TRACT
  Filled 2016-08-12: qty 28

## 2016-08-12 MED ORDER — SODIUM CHLORIDE 0.9 % IV SOLN: 250.0000 mL | INTRAVENOUS | Status: DC | PRN

## 2016-08-12 MED ORDER — CLONAZEPAM 0.5 MG PO TABS
0.5000 mg | ORAL_TABLET | Freq: Two times a day (BID) | ORAL | Status: DC
  Administered 2016-08-12 – 2016-08-14 (×4): 0.5 mg via ORAL
  Filled 2016-08-12 (×4): qty 1

## 2016-08-12 MED ORDER — INFLUENZA VAC SPLIT QUAD 0.5 ML IM SUSY: 0.5000 mL | PREFILLED_SYRINGE | INTRAMUSCULAR | Status: DC

## 2016-08-12 MED ORDER — SODIUM CHLORIDE 0.9% FLUSH: 3.0000 mL | INTRAVENOUS | Status: DC | PRN

## 2016-08-12 MED ORDER — DILTIAZEM HCL 100 MG IV SOLR
5.0000 mg/h | Freq: Once | INTRAVENOUS | Status: AC
  Administered 2016-08-12: 5 mg/h via INTRAVENOUS
  Filled 2016-08-12: qty 100

## 2016-08-12 MED ORDER — ONDANSETRON HCL 4 MG/2ML IJ SOLN: 4.0000 mg | Freq: Four times a day (QID) | INTRAMUSCULAR | Status: DC | PRN

## 2016-08-12 MED ORDER — AMLODIPINE BESYLATE 5 MG PO TABS: 10.0000 mg | ORAL_TABLET | Freq: Every day | ORAL | Status: DC

## 2016-08-12 MED ORDER — DILTIAZEM HCL 25 MG/5ML IV SOLN
20.0000 mg | Freq: Once | INTRAVENOUS | Status: AC
  Administered 2016-08-12: 20 mg via INTRAVENOUS

## 2016-08-12 MED ORDER — NAPHAZOLINE-GLYCERIN 0.012-0.2 % OP SOLN
2.0000 [drp] | Freq: Four times a day (QID) | OPHTHALMIC | Status: DC | PRN
Start: 2016-08-12 — End: 2016-08-14
  Filled 2016-08-12: qty 15

## 2016-08-12 MED ORDER — ACETAMINOPHEN 325 MG PO TABS
650.0000 mg | ORAL_TABLET | ORAL | Status: DC | PRN
  Administered 2016-08-12 – 2016-08-14 (×2): 650 mg via ORAL
  Filled 2016-08-12 (×2): qty 2

## 2016-08-12 MED ORDER — HEPARIN SODIUM (PORCINE) 5000 UNIT/ML IJ SOLN
5000.0000 [IU] | Freq: Three times a day (TID) | INTRAMUSCULAR | Status: DC
  Administered 2016-08-12 – 2016-08-14 (×5): 5000 [IU] via SUBCUTANEOUS
  Filled 2016-08-12 (×5): qty 1

## 2016-08-12 MED ORDER — DEXTROSE 5 % IV SOLN
5.0000 mg/h | INTRAVENOUS | Status: DC
  Administered 2016-08-12 – 2016-08-13 (×2): 10 mg/h via INTRAVENOUS
  Filled 2016-08-12 (×3): qty 100

## 2016-08-12 NOTE — ED Triage Notes (Signed)
Having palpitations and sob.

## 2016-08-12 NOTE — Patient Instructions (Addendum)
Medication Instructions:    Labwork:   Testing/Procedures:   Follow-Up:   Any Other Special Instructions Will Be Listed Below (If Applicable).  Go to [facility] ED now as instructed by Dr. Purvis SheffieldKoneswaran.    If you need a refill on your cardiac medications before your next appointment, please call your pharmacy.

## 2016-08-12 NOTE — Progress Notes (Signed)
CARDIOLOGY CONSULT NOTE  Patient ID: Andrea [name] MRN: [id number] DOB/AGE: October 15, 1941 75 y.o.  Admit date: (Not on file) Primary Physician: Ignatius Specking, MD Referring Physician:   Reason for Consultation: nocturnal hypoxemia, palpitations, h/o a fib  HPI: 75 yr old woman with h/o SOB, palpitations, atrial fibrillation, and nocturnal hypoxemia. Previously followed by Smitty Cords, last seen 04/2016. Has normal coronary arteries and LV systolic function.  Has iron deficiency anemia and not on anticoagulation. On IV iron.  ECG shows rapid atrial fibrillation, HR 141 bpm.  She says she has not been feeling well for months. She has exertional dyspnea, palpitations, lightheadedness, and dizziness. She denies chest pain and syncope.   She takes metoprolol on occasion but never consistently. She has not taken it today.   Allergies  Allergen Reactions  . Shellfish Allergy Hives, Itching and Swelling    Current Outpatient Prescriptions  Medication Sig Dispense Refill  . acetaminophen (TYLENOL) 325 MG tablet Take 650 mg by mouth every 6 (six) hours as needed for pain.    Marland Kitchen amLODipine (NORVASC) 10 MG tablet Take 10 mg by mouth daily.    . clonazePAM (KLONOPIN) 0.5 MG tablet Take 0.5 mg by mouth 2 (two) times daily as needed.    . Fluticasone Furoate-Vilanterol (BREO ELLIPTA IN) Inhale into the lungs.    . metoprolol tartrate (LOPRESSOR) 25 MG tablet Take 1 tablet (25 mg total) by mouth 2 (two) times daily. 60 tablet 6  . tetrahydrozoline-zinc (VISINE-AC) 0.05-0.25 % ophthalmic solution Place 2 drops into both eyes 3 (three) times daily as needed (Red Itchy Eyes).     No current facility-administered medications for this visit.     Past Medical History:  Diagnosis Date  . Anemia    secondary to acute on chronic GI bleed  . Anxiety disorder   . Diverticulosis   . Exertional dyspnea    Normal LVF, 2-D echo, 09/2012  . Hiatal hernia   . Hypercholesterolemia   . Hypertension     . Obesity   . Osteoarthritis   . Tobacco abuse     Past Surgical History:  Procedure Laterality Date  . ABDOMINAL HYSTERECTOMY    . GIVENS CAPSULE STUDY N/A 02/19/2013   Procedure: GIVENS CAPSULE STUDY;  Surgeon: Malissa Hippo, MD;  Location: [facility];  Service: Endoscopy;  Laterality: N/A;  730    Social History   Social History  . Marital status: Widowed    Spouse name: N/A  . Number of children: N/A  . Years of education: N/A   Occupational History  . Not on file.   Social History Main Topics  . Smoking status: Former Smoker    Packs/day: 0.50    Years: 20.00    Types: Cigarettes    Quit date: 06/22/2012  . Smokeless tobacco: Never Used  . Alcohol use No  . Drug use: No  . Sexual activity: Not on file   Other Topics Concern  . Not on file   Social History Narrative  . No narrative on file     No family history of premature CAD in 1st degree relatives.  Prior to Admission medications   Medication Sig Start Date End Date Taking? Authorizing Provider  acetaminophen (TYLENOL) 325 MG tablet Take 650 mg by mouth every 6 (six) hours as needed for pain.    Historical Provider, MD  amLODipine (NORVASC) 10 MG tablet Take 10 mg by mouth daily.    Historical Provider, MD  aspirin 81 MG tablet Take 81 mg by mouth daily as needed for pain.     Historical Provider, MD  clonazePAM (KLONOPIN) 0.5 MG tablet Take 0.5 mg by mouth 2 (two) times daily as needed.    Historical Provider, MD  metoprolol tartrate (LOPRESSOR) 25 MG tablet Take 1 tablet (25 mg total) by mouth 2 (two) times daily. 12/29/12   Rande BruntEugene C Serpe, PA-C  pantoprazole (PROTONIX) 40 MG tablet Take 40 mg by mouth daily as needed (Acid Reflux).     Historical Provider, MD  tetrahydrozoline-zinc (VISINE-AC) 0.05-0.25 % ophthalmic solution Place 2 drops into both eyes 3 (three) times daily as needed (Red Itchy Eyes).    Historical Provider, MD     Review of systems complete and found to be negative unless listed  above in HPI     Physical exam Blood pressure 138/80, height 5\' 3"  (1.6 m), weight 238 lb (108 kg), SpO2 98 %. General: NAD Neck: No JVD, no thyromegaly or thyroid nodule.  Lungs: Clear to auscultation bilaterally with normal respiratory effort. CV: Nondisplaced PMI. Tachycardic, irregular rhythm, normal S1/S2, no S3, no murmur.  No peripheral edema.   Abdomen: Soft, nontender, obese.  Skin: Intact without lesions or rashes.  Neurologic: Alert and oriented x 3.  Psych: Normal affect. Extremities: No clubbing or cyanosis.  HEENT: Normal.   ECG: Most recent ECG reviewed.  Labs:  No results found for: WBC, HGB, HCT, MCV, PLT No results for input(s): NA, K, CL, CO2, BUN, CREATININE, CALCIUM, PROT, BILITOT, ALKPHOS, ALT, AST, GLUCOSE in the last 168 hours.  Invalid input(s): LABALBU No results found for: CKTOTAL, CKMB, CKMBINDEX, TROPONINI No results found for: CHOL No results found for: HDL No results found for: LDLCALC No results found for: TRIG No results found for: CHOLHDL No results found for: LDLDIRECT       Studies: No results found.  ASSESSMENT AND PLAN:  1. Rapid atrial fibrillation: She has only been taking metoprolol occasionally and has not taken it today. Looking at previous cardiology notes, she is not a good candidate for anticoagulation due to anemia. I have instructed her to go to the [facility] emergency room as she will need intravenous AV nodal blocking agents to control her heart rate. She will need labs and medication adjustments. If need be, she may require transesophageal echocardiogram and direct current cardioversion.  2. HTN: Controlled.    Signed: Prentice DockerSuresh Andris Brothers, M.D., F.A.C.C.  08/12/2016, 1:39 PM

## 2016-08-12 NOTE — ED Provider Notes (Addendum)
[facility]Y DEPT Provider Note   CSN: [id number] Arrival date & time: 08/12/16  1437     History   Chief Complaint Chief Complaint  Patient presents with  . Palpitations    HPI Andrea [name] is a 75 y.o. female.   Palpitations   This is a new problem. The current episode started more than 1 week ago. The problem occurs every several days. The problem has not changed since onset.The problem is associated with exercise. Associated symptoms include chest pain and shortness of breath. She has tried nothing for the symptoms. The treatment provided no relief. There are no known risk factors.    Past Medical History:  Diagnosis Date  . Anemia    secondary to acute on chronic GI bleed  . Anxiety disorder   . Diverticulosis   . Exertional dyspnea    Normal LVF, 2-D echo, 09/2012  . Hiatal hernia   . Hypercholesterolemia   . Hypertension   . Obesity   . Osteoarthritis   . Tobacco abuse     Patient Active Problem List   Diagnosis Date Noted  . Atrial fibrillation with rapid ventricular response (HCC) 08/12/2016  . Anxiety 08/12/2016  . Atrial fibrillation with RVR (HCC) 08/12/2016  . History of iron deficiency anemia 08/12/2016  . History of GI bleed 08/12/2016  . Palpitations 11/02/2012  . Exertional dyspnea   . Hypertension   . Tobacco abuse     Past Surgical History:  Procedure Laterality Date  . ABDOMINAL HYSTERECTOMY    . GIVENS CAPSULE STUDY N/A 02/19/2013   Procedure: GIVENS CAPSULE STUDY;  Surgeon: Malissa HippoNajeeb U Rehman, MD;  Location: [facility];  Service: Endoscopy;  Laterality: N/A;  730    OB History    No data available       Home Medications    Prior to Admission medications   Medication Sig Start Date End Date Taking? Authorizing Provider  amLODipine (NORVASC) 10 MG tablet Take 10 mg by mouth daily.   Yes Historical Provider, MD  BREO ELLIPTA 100-25 MCG/INH AEPB Inhale 1 puff into the lungs daily.  07/12/16  Yes Historical Provider, MD    clonazePAM (KLONOPIN) 0.5 MG tablet Take 0.5 mg by mouth 2 (two) times daily as needed for anxiety.    Yes Historical Provider, MD  metoprolol (LOPRESSOR) 50 MG tablet Take 50 mg by mouth 2 (two) times daily.  08/10/16  Yes Historical Provider, MD  naproxen sodium (ALEVE) 220 MG tablet Take 220-440 mg by mouth daily as needed. For pain   Yes Historical Provider, MD  OXYGEN Inhale 3 L into the lungs at bedtime.   Yes Historical Provider, MD  tetrahydrozoline-zinc (VISINE-AC) 0.05-0.25 % ophthalmic solution Place 2 drops into both eyes 3 (three) times daily as needed (Red Itchy Eyes).   Yes Historical Provider, MD    Family History Family History  Problem Relation Age of Onset  . Hypertension Brother     Social History Social History  Substance Use Topics  . Smoking status: Former Smoker    Packs/day: 0.50    Years: 20.00    Types: Cigarettes    Quit date: 06/22/2012  . Smokeless tobacco: Never Used  . Alcohol use No     Allergies   Shellfish allergy   Review of Systems Review of Systems  Respiratory: Positive for shortness of breath.   Cardiovascular: Positive for chest pain and palpitations.  All other systems reviewed and are negative.    Physical Exam Updated Vital  Signs BP 135/66   Pulse 95   Temp 98.3 F (36.8 C) (Oral)   Resp 18   Ht 5\' 3"  (1.6 m)   Wt 235 lb 10.8 oz (106.9 kg)   SpO2 100%   BMI 41.75 kg/m   Physical Exam  Constitutional: She appears well-developed and well-nourished. She appears distressed.  HENT:  Head: Normocephalic and atraumatic.  Eyes: Conjunctivae are normal.  Neck: Neck supple.  Cardiovascular: Regular rhythm.  Tachycardia present.   No murmur heard. Pulmonary/Chest: Effort normal and breath sounds normal. No respiratory distress.  Abdominal: Soft. There is no tenderness.  Musculoskeletal: She exhibits edema (mild).  Neurological: She is alert.  Skin: Skin is warm and dry.  Psychiatric: She has a normal mood and affect.   Nursing note and vitals reviewed.    ED Treatments / Results  Labs (all labs ordered are listed, but only abnormal results are displayed) Labs Reviewed  COMPREHENSIVE METABOLIC PANEL - Abnormal; Notable for the following:       Result Value   Glucose, Bld 120 (*)    Calcium 8.7 (*)    Anion gap 2 (*)    All other components within normal limits  BRAIN NATRIURETIC PEPTIDE - Abnormal; Notable for the following:    B Natriuretic Peptide 173.0 (*)    All other components within normal limits  MRSA PCR SCREENING  CBC WITH DIFFERENTIAL/PLATELET  MAGNESIUM  TROPONIN I  TSH  TROPONIN I  T4, FREE  TROPONIN I  TROPONIN I  BASIC METABOLIC PANEL    EKG  EKG Interpretation  Date/Time:  Thursday August 12 2016 14:44:30 EDT Ventricular Rate:  153 PR Interval:    QRS Duration: 72 QT Interval:  260 QTC Calculation: 415 R Axis:   36 Text Interpretation:  Undetermined rhythm Low voltage QRS Septal infarct , age undetermined Marked ST abnormality, possible inferior subendocardial injury Abnormal ECG Confirmed by [facility] Madison Hights MD, Barbara Cower [phone]) on 08/12/2016 3:10:11 PM       Radiology No results found.  Procedures Procedures (including critical care time)  CRITICAL CARE Performed by: Marily Memos Total critical care time: 35 minutes Critical care time was exclusive of separately billable procedures and treating other patients. Critical care was necessary to treat or prevent imminent or life-threatening deterioration. Critical care was time spent personally by me on the following activities: development of treatment plan with patient and/or surrogate as well as nursing, discussions with consultants, evaluation of patient's response to treatment, examination of patient, obtaining history from patient or surrogate, ordering and performing treatments and interventions, ordering and review of laboratory studies, ordering and review of radiographic studies, pulse oximetry and re-evaluation  of patient's condition.   Medications Ordered in ED Medications  Influenza vac split quadrivalent PF (FLUARIX) injection 0.5 mL (not administered)  fluticasone furoate-vilanterol (BREO ELLIPTA) 100-25 MCG/INH 1 puff (not administered)  naphazoline-glycerin (CLEAR EYES) ophth solution 2 drop (not administered)  amLODipine (NORVASC) tablet 10 mg (not administered)  clonazePAM (KLONOPIN) tablet 0.5 mg (not administered)  acetaminophen (TYLENOL) tablet 650 mg (not administered)  ondansetron (ZOFRAN) injection 4 mg (not administered)  sodium chloride flush (NS) 0.9 % injection 3 mL (not administered)  sodium chloride flush (NS) 0.9 % injection 3 mL (not administered)  0.9 %  sodium chloride infusion (not administered)  heparin injection 5,000 Units (not administered)  diltiazem (CARDIZEM) 100 mg in dextrose 5 % 100 mL (1 mg/mL) infusion (10 mg/hr Intravenous Rate/Dose Change 08/12/16 1852)  diltiazem (CARDIZEM) injection 20 mg (20 mg Intravenous  Bolus 08/12/16 1532)  diltiazem (CARDIZEM) 100 mg in dextrose 5 % 100 mL (1 mg/mL) infusion (5 mg/hr Intravenous Transfusing/Transfer 08/12/16 1731)     Initial Impression / Assessment and Plan / ED Course  I have reviewed the triage vital signs and the nursing notes.  Pertinent labs & imaging results that were available during my care of the patient were reviewed by me and considered in my medical decision making (see chart for details).  Clinical Course    75 year old female here with atrial fibrillation with rapid ventricular response of unknown time frame likely months. At 3 view and notes from her cardiologist I started her on a diltiazem bolus and drip with rate control but not rhythm control after that. Even though her ChadsVasc was 4, her cardiologist recommended not anticoagulating her so discussed with the hospitalist and they will make that decision as inpatient.   Final Clinical Impressions(s) / ED Diagnoses   Final diagnoses:  Atrial  fibrillation with rapid ventricular response [facility])    New Prescriptions Current Discharge Medication List       Marily Memos, MD 08/12/16 2035    Marily Memos, MD 08/12/16 2037

## 2016-08-12 NOTE — H&P (Signed)
History and Physical    AMIRE GOSSEN NDL:[id number] DOB: 1941/06/14 DOA: 08/12/2016  PCP: Ignatius Specking, MD   Patient coming from: Home, by way of cardiologist's office   Chief Complaint: Palpitations, dyspnea   HPI: Andrea [name] is a 75 y.o. female with medical history significant for GI bleed, iron deficiency anemia, hypertension, anxiety, and atrial fibrillation who presents to the emergency department for evaluation of palpitations and dyspnea. Patient was previously followed by cardiology through [facility], was suspected having atrial fibrillation and was scheduled for a Holter monitor, but patient never wore it. She has been prescribed twice-daily Lopressor, but only takes it occasionally. She reports that for the past month, she has been experiencing palpitations and exertional dyspnea, but no chest pain or peripheral edema. She was able to secure an appointment with the cardiologist today, was noted to have atrial fibrillation with rate in the 140s in the office, and was directed to the emergency department for further evaluation and management. Patient reports having a normal coronary catheterization and normal echo within the past year through [facility] cardiology. She denies any significant alcohol use and has no known thyroid disease. She denies headache, change in vision or hearing, loss of Carnation, or focal numbness or weakness.  ED Course: Upon arrival to the ED, patient is found to be afebrile, saturating well on room air, with heart rate in the 130s, and blood pressure elevated to 158/108. EKG demonstrates an atrial fibrillation with rate 153, low-voltage QRS, and inferolateral ST depressions. Chemistry panel and CBC are unremarkable and troponin is undetectable. Patient was given an IV push of diltiazem and started on a diltiazem infusion. Rate improved to the low 100s and repeat EKG demonstrates replacement of the ST depressions with flattening and a inversion of the lateral T waves.  Patient remains stable on the diltiazem drip with heart rate in the 90s to low 100s. She is in no respiratory distress. Given that she is on a titratable drip, she will be observed in the stepdown unit for ongoing evaluation and management of atrial fibrillation with RVR.  Review of Systems:  All other systems reviewed and apart from HPI, are negative.  Past Medical History:  Diagnosis Date  . Anemia    secondary to acute on chronic GI bleed  . Anxiety disorder   . Diverticulosis   . Exertional dyspnea    Normal LVF, 2-D echo, 09/2012  . Hiatal hernia   . Hypercholesterolemia   . Hypertension   . Obesity   . Osteoarthritis   . Tobacco abuse     Past Surgical History:  Procedure Laterality Date  . ABDOMINAL HYSTERECTOMY    . GIVENS CAPSULE STUDY N/A 02/19/2013   Procedure: GIVENS CAPSULE STUDY;  Surgeon: Malissa Hippo, MD;  Location: [facility];  Service: Endoscopy;  Laterality: N/A;  730     reports that she quit smoking about 4 years ago. Her smoking use included Cigarettes. She has a 10.00 pack-year smoking history. She has never used smokeless tobacco. She reports that she does not drink alcohol or use drugs.  Allergies  Allergen Reactions  . Shellfish Allergy Hives, Itching and Swelling    Family History  Problem Relation Age of Onset  . Hypertension Brother      Prior to Admission medications   Medication Sig Start Date End Date Taking? Authorizing Provider  amLODipine (NORVASC) 10 MG tablet Take 10 mg by mouth daily.   Yes Historical Provider, MD  BREO ELLIPTA 100-25  MCG/INH AEPB Inhale 1 puff into the lungs daily.  07/12/16  Yes Historical Provider, MD  clonazePAM (KLONOPIN) 0.5 MG tablet Take 0.5 mg by mouth 2 (two) times daily as needed for anxiety.    Yes Historical Provider, MD  metoprolol (LOPRESSOR) 50 MG tablet Take 50 mg by mouth 2 (two) times daily.  08/10/16  Yes Historical Provider, MD  naproxen sodium (ALEVE) 220 MG tablet Take 220-440 mg by mouth  daily as needed. For pain   Yes Historical Provider, MD  OXYGEN Inhale 3 L into the lungs at bedtime.   Yes Historical Provider, MD  tetrahydrozoline-zinc (VISINE-AC) 0.05-0.25 % ophthalmic solution Place 2 drops into both eyes 3 (three) times daily as needed (Red Itchy Eyes).   Yes Historical Provider, MD    Physical Exam: Vitals:   08/12/16 1546 08/12/16 1615 08/12/16 1740 08/12/16 1804  BP: 114/69 113/80 135/66   Pulse: 89 93 95   Resp: '18 23 18   '$ Temp:      TempSrc:      SpO2: 99% 97% 100%   Weight:    106.9 kg (235 lb 10.8 oz)  Height:    '5\' 3"'$  (1.6 m)      Constitutional: NAD, calm, comfortable Eyes: PERTLA, lids and conjunctivae normal ENMT: Mucous membranes are moist. Posterior pharynx clear of any exudate or lesions.   Neck: normal, supple, no masses, no thyromegaly Respiratory: clear to auscultation bilaterally, no wheezing, no crackles. Normal respiratory effort.   Cardiovascular: Rate ~100 and irregular. No carotid bruits. No significant JVD. Abdomen: No distension, no tenderness, no masses palpated. Bowel sounds normal.  Musculoskeletal: no clubbing / cyanosis. No joint deformity upper and lower extremities. Normal muscle tone.  Skin: no significant rashes, lesions, ulcers. Warm, dry, well-perfused. Neurologic: CN 2-12 grossly intact. Sensation intact, DTR normal. Strength 5/5 in all 4 limbs.  Psychiatric: Normal judgment and insight. Alert and oriented x 3. Normal mood and affect.     Labs on Admission: I have personally reviewed following labs and imaging studies  CBC:  Recent Labs Lab 08/12/16 1526  WBC 7.5  NEUTROABS 5.6  HGB 13.1  HCT 40.7  MCV 97.4  PLT 263   Basic Metabolic Panel:  Recent Labs Lab 08/12/16 1526  NA 138  K 4.0  CL 111  CO2 25  GLUCOSE 120*  BUN 16  CREATININE 0.87  CALCIUM 8.7*  MG 1.9   GFR: Estimated Creatinine Clearance: 63.4 mL/min (by C-G formula based on SCr of 0.87 mg/dL). Liver Function Tests:  Recent  Labs Lab 08/12/16 1526  AST 16  ALT 16  ALKPHOS 122  BILITOT 0.7  PROT 7.0  ALBUMIN 4.1   No results for input(s): LIPASE, AMYLASE in the last 168 hours. No results for input(s): AMMONIA in the last 168 hours. Coagulation Profile: No results for input(s): INR, PROTIME in the last 168 hours. Cardiac Enzymes:  Recent Labs Lab 08/12/16 1526  TROPONINI <0.03   BNP (last 3 results) No results for input(s): PROBNP in the last 8760 hours. HbA1C: No results for input(s): HGBA1C in the last 72 hours. CBG: No results for input(s): GLUCAP in the last 168 hours. Lipid Profile: No results for input(s): CHOL, HDL, LDLCALC, TRIG, CHOLHDL, LDLDIRECT in the last 72 hours. Thyroid Function Tests: No results for input(s): TSH, T4TOTAL, FREET4, T3FREE, THYROIDAB in the last 72 hours. Anemia Panel: No results for input(s): VITAMINB12, FOLATE, FERRITIN, TIBC, IRON, RETICCTPCT in the last 72 hours. Urine analysis: No results found for:  COLORURINE, APPEARANCEUR, LABSPEC, PHURINE, GLUCOSEU, HGBUR, BILIRUBINUR, KETONESUR, PROTEINUR, UROBILINOGEN, NITRITE, LEUKOCYTESUR Sepsis Labs: '@LABRCNTIP'$ (procalcitonin:4,lacticidven:4) )No results found for this or any previous visit (from the past 240 hour(s)).   Radiological Exams on Admission: No results found.  EKG: Independently reviewed. Atrial fibrillation (rate 153), low-voltage QRS, inferolateral ST-depression.  Repeat after initial rate-control:  Atrial fibrillation (rate 102), low-voltage, lateral T-wave flattening/inversion  Assessment/Plan  1. Atrial fibrillation with RVR  - Sent from cardiology clinic where she was dyspneic with rates in 140's  - Initial EKG here with AF, rate 153, and ST-depressions  - Rate settled into 90's after diltiazem IVP and initiation of infusion; ST-depressions were replaced with T-wave flattening and inversion following rate-control  - CHADS-VASc is at least 66 (age x2, gender, HTN), but not anticoagulated d/t hx of  GIB and iron-deficiency anemia requiring iron infusions  - Continue diltiazem infusion and convert to oral diltiazem once parameters met - Monitor on telemetry  - Check TSH, free T4, troponin, and TTE for possible etiology  - Hold off on [facility] for now given hx of GIB  2. Exertional dyspnea  - Suspected secondary to uncontrolled AF with RVR; addressing this as above  - CXR pending, lungs CTAB, saturating well on rm air  - TTE ordered    3. Hypertension  - Managed with Norvasc and Lopressor at home, though pt admits she only takes them occasionally  - BP is elevated in the ED  - She is currently on diltiazem infusion and will be converted to oral diltiazem  - Continue Norvasc, hold Lopressor   4. Anxiety  - Stable, continue Klonopin    5. Iron-deficiency anemia  - H&H are wnl on admission  - She receives iron infusions through [facility]/on[facility]    DVT prophylaxis: sq heparin  Code Status: Full  Family Communication: Brother updated at bedside Disposition Plan: Observe in stepdown  Consults called: None Admission status: Observation     Vianne Bulls, MD [facility] Pager [phone]  If 7PM-7AM, please contact night-coverage [email or web address] Password [facility]  08/12/2016, 6:39 PM

## 2016-08-13 ENCOUNTER — Observation Stay (HOSPITAL_COMMUNITY): Payer: Medicare Other

## 2016-08-13 DIAGNOSIS — R0609 Other forms of dyspnea: Secondary | ICD-10-CM | POA: Diagnosis not present

## 2016-08-13 DIAGNOSIS — E669 Obesity, unspecified: Secondary | ICD-10-CM | POA: Diagnosis present

## 2016-08-13 DIAGNOSIS — I10 Essential (primary) hypertension: Secondary | ICD-10-CM

## 2016-08-13 DIAGNOSIS — Z9981 Dependence on supplemental oxygen: Secondary | ICD-10-CM | POA: Diagnosis not present

## 2016-08-13 DIAGNOSIS — I4891 Unspecified atrial fibrillation: Principal | ICD-10-CM

## 2016-08-13 DIAGNOSIS — Z8249 Family history of ischemic heart disease and other diseases of the circulatory system: Secondary | ICD-10-CM | POA: Diagnosis not present

## 2016-08-13 DIAGNOSIS — F419 Anxiety disorder, unspecified: Secondary | ICD-10-CM | POA: Diagnosis not present

## 2016-08-13 DIAGNOSIS — R0600 Dyspnea, unspecified: Secondary | ICD-10-CM | POA: Diagnosis not present

## 2016-08-13 DIAGNOSIS — Z6841 Body Mass Index (BMI) 40.0 and over, adult: Secondary | ICD-10-CM | POA: Diagnosis not present

## 2016-08-13 DIAGNOSIS — D509 Iron deficiency anemia, unspecified: Secondary | ICD-10-CM | POA: Diagnosis present

## 2016-08-13 DIAGNOSIS — Z87891 Personal history of nicotine dependence: Secondary | ICD-10-CM | POA: Diagnosis not present

## 2016-08-13 DIAGNOSIS — E7800 Pure hypercholesterolemia, unspecified: Secondary | ICD-10-CM | POA: Diagnosis present

## 2016-08-13 DIAGNOSIS — Z720 Tobacco use: Secondary | ICD-10-CM

## 2016-08-13 LAB — ECHOCARDIOGRAM COMPLETE
AVLVOTPG: 2 mmHg
EWDT: 169 ms
FS: 42 % (ref 28–44)
HEIGHTINCHES: 63 in
IVS/LV PW RATIO, ED: 1.09
LA diam end sys: 47 mm
LA diam index: 2.11 cm/m2
LA vol A4C: 66.2 ml
LASIZE: 47 mm
LAVOL: 63.6 mL
LAVOLIN: 28.5 mL/m2
LV PW d: 13 mm — AB (ref 0.6–1.1)
LV SIMPSON'S DISK: 58
LV dias vol index: 26 mL/m2
LVDIAVOL: 59 mL (ref 46–106)
LVOT SV: 36 mL
LVOT VTI: 15.9 cm
LVOT area: 2.27 cm2
LVOTD: 17 mm
LVOTPV: 77.6 cm/s
LVSYSVOL: 25 mL (ref 14–42)
LVSYSVOLIN: 11 mL/m2
MV Dec: 169
MV Peak grad: 5 mmHg
MV pk A vel: 26.7 m/s
MV pk E vel: 113 m/s
RV TAPSE: 16 mm
RV sys press: 26 mmHg
Reg peak vel: 239 cm/s
Stroke v: 34 ml
TR max vel: 239 cm/s
WEIGHTICAEL: 3753.11 [oz_av]

## 2016-08-13 LAB — BASIC METABOLIC PANEL
BUN: 11 mg/dL (ref 6–20)
CALCIUM: 8.2 mg/dL — AB (ref 8.9–10.3)
CO2: 25 mmol/L (ref 22–32)
CREATININE: 0.8 mg/dL (ref 0.44–1.00)
Chloride: 111 mmol/L (ref 101–111)
GFR calc Af Amer: >60 mL/min (ref >60)
GFR calc non Af Amer: >60 mL/min (ref >60)
Glucose, Bld: 114 mg/dL — ABNORMAL HIGH (ref 65–99)
Potassium: 3.8 mmol/L (ref 3.5–5.1)
SODIUM: 137 mmol/L (ref 135–145)

## 2016-08-13 LAB — TROPONIN I

## 2016-08-13 LAB — MRSA PCR SCREENING: MRSA BY PCR: NEGATIVE

## 2016-08-13 LAB — T4, FREE: FREE T4: 0.85 ng/dL (ref 0.61–1.12)

## 2016-08-13 MED ORDER — METOPROLOL TARTRATE 50 MG PO TABS
50.0000 mg | ORAL_TABLET | Freq: Two times a day (BID) | ORAL | Status: DC
  Administered 2016-08-13 – 2016-08-14 (×4): 50 mg via ORAL
  Filled 2016-08-13 (×4): qty 1

## 2016-08-13 MED ORDER — ALUM & MAG HYDROXIDE-SIMETH 200-200-20 MG/5ML PO SUSP: 15.0000 mL | Freq: Four times a day (QID) | ORAL | Status: DC | PRN

## 2016-08-13 NOTE — Progress Notes (Signed)
*  PRELIMINARY RESULTS* Echocardiogram 2D Echocardiogram has been performed.  Andrea [name], Andrea [name] 08/13/2016, 12:54 PM

## 2016-08-13 NOTE — Care Management Obs Status (Signed)
MEDICARE OBSERVATION STATUS NOTIFICATION   Patient Details  Name: Andrea [name] MRN: [id number] Date of Birth: 1940-12-05   Medicare Observation Status Notification Given:  Yes    Malcolm MetroChildress, Aedin Jeansonne Demske, RN 08/13/2016, 9:51 AM

## 2016-08-13 NOTE — Progress Notes (Signed)
PROGRESS NOTE    Andrea [name]  ZOX:[id number] DOB: 1941-07-02 DOA: 08/12/2016 PCP: Ignatius Specking, MD    Brief Narrative: 13 yof with a hx of GI bleed, anxiety, HTN, obesity, afib, and tobacco abuse presented with complaints of palpations and dyspnea. She was scheduled for a Holter monitor, but she never wore it. She was referred to the ED by cardiologist as she was noted to have atrial fibrillation with a rate in the 140s. While in the ED, she was noted to by hypertensive with BNP of 173.0.  EKG revealed atrial fibrillation, low-voltage QRS, and inferolateral ST depressions.  She was admitted for further evaluation of atrial fibrillation with RVR.   Assessment & Plan:   Principal Problem:   Atrial fibrillation with rapid ventricular response (HCC) Active Problems:   Exertional dyspnea   Hypertension   Palpitations   Tobacco abuse   Anxiety   Atrial fibrillation with RVR (HCC)   History of iron deficiency anemia   History of GI bleed   Dyspnea  1. Atrial fibrillation with RVR. She has a CHADSVASC score of 4. Not felt to be a candidate for anticoagulation due to her history of GI bleeding. She has been inconsistent in taking her metoprolol at home. Heart rate has improved with initiation of cardizem infusion. Will restart her home dose of lopressor and try and wean off cardizem.  Repeat EKG showed afib with T-wave flattering and inversions which are chronic. Echo has been ordered.  TSH is normal 2. Exertional dyspnea. Possibly secondary to uncontrolled afib with RVR. She is saturating well on room air. A TTE has been ordered.  3. HTN. Stable. Continue lopressor.  4. Anxiety. Noted. Continue outpatient medication.  5. Iron-deficiency anemia. Hgb is within normal limits. She receives iron infusions through [facility].  6. Tobacco abuse. Counseled on the importance of cessation.  7. Obesity. Noted.   DVT prophylaxis: Heparin  Code Status: Full  Family Communication: no family  bedside Disposition Plan: Discharge home once improved.    Consultants:   None   Procedures:   None   Antimicrobials:   None    Subjective: Feeling better, still feels mildly short of breath, no chest pain  Objective: Vitals:   08/12/16 2000 08/13/16 0000 08/13/16 0400 08/13/16 0500  BP:      Pulse:      Resp:   (!) 24   Temp: 98.3 F (36.8 C) 97.9 F (36.6 C) 97.4 F (36.3 C)   TempSrc: Oral Oral Oral   SpO2:      Weight:    106.4 kg (234 lb 9.1 oz)  Height:        Intake/Output Summary (Last 24 hours) at 08/13/16 0637 Last data filed at 08/13/16 0600  Gross per 24 hour  Intake           129.41 ml  Output                0 ml  Net           129.41 ml   Filed Weights   08/12/16 1442 08/12/16 1804 08/13/16 0500  Weight: 108 kg (238 lb) 106.9 kg (235 lb 10.8 oz) 106.4 kg (234 lb 9.1 oz)    Examination:  General exam: Appears calm and comfortable  Respiratory system: Clear to auscultation. Respiratory effort normal. Cardiovascular system: S1 & S2 heard, irregular. No JVD, murmurs, rubs, gallops or clicks. No pedal edema. Gastrointestinal system: Abdomen is nondistended, soft and nontender. No organomegaly  or masses felt. Normal bowel sounds heard. Central nervous system: Alert and oriented. No focal neurological deficits. Extremities: Symmetric 5 x 5 power. Skin: No rashes, lesions or ulcers Psychiatry: Judgement and insight appear normal. Mood & affect appropriate.     Data Reviewed: I have personally reviewed following labs and imaging studies  CBC:  Recent Labs Lab 08/12/16 1526  WBC 7.5  NEUTROABS 5.6  HGB 13.1  HCT 40.7  MCV 97.4  PLT 278   Basic Metabolic Panel:  Recent Labs Lab 08/12/16 1526  NA 138  K 4.0  CL 111  CO2 25  GLUCOSE 120*  BUN 16  CREATININE 0.87  CALCIUM 8.7*  MG 1.9   GFR: Estimated Creatinine Clearance: 63.3 mL/min (by C-G formula based on SCr of 0.87 mg/dL). Liver Function Tests:  Recent Labs Lab  08/12/16 1526  AST 16  ALT 16  ALKPHOS 122  BILITOT 0.7  PROT 7.0  ALBUMIN 4.1   No results for input(s): LIPASE, AMYLASE in the last 168 hours. No results for input(s): AMMONIA in the last 168 hours. Coagulation Profile: No results for input(s): INR, PROTIME in the last 168 hours. Cardiac Enzymes:  Recent Labs Lab 08/12/16 1526 08/12/16 1858 08/13/16 0024  TROPONINI <0.03 <0.03 <0.03   BNP (last 3 results) No results for input(s): PROBNP in the last 8760 hours. HbA1C: No results for input(s): HGBA1C in the last 72 hours. CBG: No results for input(s): GLUCAP in the last 168 hours. Lipid Profile: No results for input(s): CHOL, HDL, LDLCALC, TRIG, CHOLHDL, LDLDIRECT in the last 72 hours. Thyroid Function Tests:  Recent Labs  08/12/16 1526  TSH 1.303   Anemia Panel: No results for input(s): VITAMINB12, FOLATE, FERRITIN, TIBC, IRON, RETICCTPCT in the last 72 hours. Sepsis Labs: No results for input(s): PROCALCITON, LATICACIDVEN in the last 168 hours.  No results found for this or any previous visit (from the past 240 hour(s)).       Radiology Studies: No results found.      Scheduled Meds: . amLODipine  10 mg Oral Daily  . clonazePAM  0.5 mg Oral BID  . fluticasone furoate-vilanterol  1 puff Inhalation Daily  . heparin subcutaneous  5,000 Units Subcutaneous Q8H  . Influenza vac split quadrivalent PF  0.5 mL Intramuscular Tomorrow-1000  . sodium chloride flush  3 mL Intravenous Q12H   Continuous Infusions: . diltiazem (CARDIZEM) infusion 10 mg/hr (08/13/16 0636)     LOS: 0 days    Time spent: 25 minutes     Erick BlinksJehanzeb Memon, MD [facility] If 7PM-7AM, please contact night-coverage [email or web address] Password [email or web address] 08/13/2016, 6:37 AM

## 2016-08-13 NOTE — Progress Notes (Signed)
Attempted 3 time to start IV so left AC catheter could be removed. Failed all 3 times.

## 2016-08-13 NOTE — Care Management Note (Signed)
Case Management Note  Patient Details  Name: Andrea [name] MRN: [id number] Date of Birth: Oct 07, 1941  Subjective/Objective:                  Pt admitted with a-fib. Pt is from home, lives with family and is ind with ADL's. [place] She plans to return home with self care. She will not require anticoagulation at this time per MD.  Action/Plan: Anticipate DC home in next 24 hrs, no CM needs.   Expected Discharge Date:    08/13/2016              Expected Discharge Plan:  Home/Self Care  In-House Referral:  NA  Discharge planning Services  CM Consult  Post Acute Care Choice:  NA Choice offered to:  NA  DME Arranged:    DME Agency:     HH Arranged:    HH Agency:     Status of Service:  Completed, signed off  If discussed at [employer] Length of Stay Meetings, dates discussed:    Additional Comments:   Malcolm MetroChildress, Bayan Kushnir Demske, RN 08/13/2016, 9:51 AM

## 2016-08-14 LAB — GLUCOSE, CAPILLARY: Glucose-Capillary: 149 mg/dL — ABNORMAL HIGH (ref 65–99)

## 2016-08-14 MED ORDER — METOPROLOL TARTRATE 50 MG PO TABS: 50.0000 mg | ORAL_TABLET | Freq: Two times a day (BID) | ORAL | 0 refills | Status: DC

## 2016-08-14 NOTE — Progress Notes (Signed)
Pt left facility with family member.

## 2016-08-14 NOTE — Discharge Summary (Signed)
Physician Discharge Summary  Andrea [name] ZOX:[id number] DOB: 19-Dec-1940 DOA: 08/12/2016  PCP: Ignatius Specking, MD  Admit date: 08/12/2016 Discharge date: 08/14/2016  Admitted From: home Disposition: home  Recommendations for Outpatient Follow-up:  1. Follow up with PCP in 1-2 weeks   Home Health: NO Equipment/Devices:  Discharge Condition: stable CODE STATUS: full Diet recommendation: Heart Healthy   Brief/Interim Summary: 82 yof with a hx of GI bleed, anxiety, HTN, obesity, afib, and tobacco abuse presented with complaints of palpations and dyspnea. She was scheduled for a Holter monitor, but she never wore it. She was referred to the ED by cardiologist as she was noted to have atrial fibrillation with a rate in the 140s. While in the ED, she was noted to by hypertensive with BNP of 173.0.  EKG revealed atrial fibrillation, low-voltage QRS, and inferolateral ST depressions.  She was admitted for further evaluation of atrial fibrillation with RVR.  Discharge Diagnoses:  Principal Problem:   Atrial fibrillation with rapid ventricular response (HCC) Active Problems:   Exertional dyspnea   Hypertension   Palpitations   Tobacco abuse   Anxiety   Atrial fibrillation with RVR (HCC)   History of iron deficiency anemia   History of GI bleed   Dyspnea  1. Atrial fibrillation with RVR. She has a CHADSVASC score of 4. Not felt to be a candidate for anticoagulation due to her history of GI bleeding. She had been inconsistent in taking her metoprolol at home. Heart rate improved with initiation of cardizem infusion. She was restarted on her home dose of lopressor and heart rates remained stable at rest and on ambulation.  Echo was unremarkable.  TSH is normal 2. HTN. Stable. Continue lopressor.  3. Anxiety. Noted. Continue outpatient medication.  4. Iron-deficiency anemia. Hgb is within normal limits. She receives iron infusions through [facility].  5. Tobacco abuse. Counseled on the importance  of cessation.  6. Obesity. Noted.   Discharge Instructions  Discharge Instructions    Diet - low sodium heart healthy    Complete by:  As directed    Increase activity slowly    Complete by:  As directed        Medication List    STOP taking these medications   amLODipine 10 MG tablet Commonly known as:  NORVASC     TAKE these medications   ALEVE 220 MG tablet Generic drug:  naproxen sodium Take 220-440 mg by mouth daily as needed. For pain   BREO ELLIPTA 100-25 MCG/INH Aepb Generic drug:  fluticasone furoate-vilanterol Inhale 1 puff into the lungs daily.   clonazePAM 0.5 MG tablet Commonly known as:  KLONOPIN Take 0.5 mg by mouth 2 (two) times daily as needed for anxiety.   metoprolol 50 MG tablet Commonly known as:  LOPRESSOR Take 1 tablet (50 mg total) by mouth 2 (two) times daily.   OXYGEN Inhale 3 L into the lungs at bedtime.   tetrahydrozoline-zinc 0.05-0.25 % ophthalmic solution Commonly known as:  VISINE-AC Place 2 drops into both eyes 3 (three) times daily as needed (Red Itchy Eyes).       Allergies  Allergen Reactions  . Shellfish Allergy Hives, Itching and Swelling    Consultations:     Procedures/Studies: Dg Chest 1 View  Result Date: 08/13/2016 CLINICAL DATA:  Palpitations and exertional dyspnea. Atrial fibrillation with RVR. EXAM: CHEST 1 VIEW COMPARISON:  03/01/2014 FINDINGS: Heart size is mildly enlarged but likely accentuated by the technique and positioning. Lung markings are mildly prominent  but appear stable. No overt pulmonary edema. Stable soft tissue density in the right paratracheal region. Aortic arch calcifications. No acute bone abnormality. There is a subtle density between the anterior first and second ribs that appears to be a chronic finding. IMPRESSION: No acute chest findings. Aortic atherosclerosis. Electronically Signed   By: Richarda OverlieAdam  Henn M.D.   On: 08/13/2016 07:36    Echo: - Left ventricle: The cavity size was normal.  Wall thickness was   increased in a pattern of moderate LVH. Systolic function was   normal. The estimated ejection fraction was in the range of 50%   to 55%. Wall motion was normal; there were no regional wall   motion abnormalities. The study was not technically sufficient to   allow evaluation of LV diastolic dysfunction due to atrial   fibrillation. - Aortic valve: Mildly calcified annulus. Trileaflet; mildly   thickened leaflets. There was mild regurgitation. Valve area   (VTI): 1.63 cm^2. Valve area (Vmax): 1.58 cm^2. Valve area   (Vmean): 1.64 cm^2. - Mitral valve: Mildly calcified annulus. Mildly thickened leaflets   . There was mild regurgitation. - Left atrium: The atrium was mildly dilated. - Right ventricle: The cavity size was mildly dilated. - Right atrium: The atrium was mildly dilated. - Tricuspid valve: There was moderate regurgitation. The TR VC is   0.5 cm. - Pulmonary arteries: Systolic pressure was mildly increased. PA   peak pressure: 31 mm Hg (S). - Technically adequate study.   Subjective: Still has some mild shortness of breath, but overall improved. No palpitations  Discharge Exam: Vitals:   08/14/16 1417 08/14/16 1433  BP: 127/87   Pulse: 76   Resp:    Temp:  98 F (36.7 C)   Vitals:   08/14/16 1300 08/14/16 1400 08/14/16 1417 08/14/16 1433  BP:   127/87   Pulse: 75 98 76   Resp:      Temp:    98 F (36.7 C)  TempSrc:    Oral  SpO2:   97%   Weight:      Height:        General: Pt is alert, awake, not in acute distress Cardiovascular: irregular, S1/S2 +, no rubs, no gallops Respiratory: CTA bilaterally, no wheezing, no rhonchi Abdominal: Soft, NT, ND, bowel sounds + Extremities: no edema, no cyanosis    The results of significant diagnostics from this hospitalization (including imaging, microbiology, ancillary and laboratory) are listed below for reference.     Microbiology: Recent Results (from the past 240 hour(s))  MRSA PCR  Screening     Status: None   Collection Time: 08/12/16  5:57 PM  Result Value Ref Range Status   MRSA by PCR NEGATIVE NEGATIVE Final    Comment:        The GeneXpert MRSA Assay (FDA approved for NASAL specimens only), is one component of a comprehensive MRSA colonization surveillance program. It is not intended to diagnose MRSA infection nor to guide or monitor treatment for MRSA infections.      Labs: BNP (last 3 results)  Recent Labs  08/12/16 1526  BNP 173.0*   Basic Metabolic Panel:  Recent Labs Lab 08/12/16 1526 08/13/16 0639  NA 138 137  K 4.0 3.8  CL 111 111  CO2 25 25  GLUCOSE 120* 114*  BUN 16 11  CREATININE 0.87 0.80  CALCIUM 8.7* 8.2*  MG 1.9  --    Liver Function Tests:  Recent Labs Lab 08/12/16 1526  AST 16  ALT 16  ALKPHOS 122  BILITOT 0.7  PROT 7.0  ALBUMIN 4.1   No results for input(s): LIPASE, AMYLASE in the last 168 hours. No results for input(s): AMMONIA in the last 168 hours. CBC:  Recent Labs Lab 08/12/16 1526  WBC 7.5  NEUTROABS 5.6  HGB 13.1  HCT 40.7  MCV 97.4  PLT 278   Cardiac Enzymes:  Recent Labs Lab 08/12/16 1526 08/12/16 1858 08/13/16 0024 08/13/16 0639  TROPONINI <0.03 <0.03 <0.03 <0.03   BNP: Invalid input(s): POCBNP CBG:  Recent Labs Lab 08/14/16 1307  GLUCAP 149*   D-Dimer No results for input(s): DDIMER in the last 72 hours. Hgb A1c No results for input(s): HGBA1C in the last 72 hours. Lipid Profile No results for input(s): CHOL, HDL, LDLCALC, TRIG, CHOLHDL, LDLDIRECT in the last 72 hours. Thyroid function studies  Recent Labs  08/12/16 1526  TSH 1.303   Anemia work up No results for input(s): VITAMINB12, FOLATE, FERRITIN, TIBC, IRON, RETICCTPCT in the last 72 hours. Urinalysis No results found for: COLORURINE, APPEARANCEUR, LABSPEC, PHURINE, GLUCOSEU, HGBUR, BILIRUBINUR, KETONESUR, PROTEINUR, UROBILINOGEN, NITRITE, LEUKOCYTESUR Sepsis Labs Invalid input(s): PROCALCITONIN,  WBC,   LACTICIDVEN Microbiology Recent Results (from the past 240 hour(s))  MRSA PCR Screening     Status: None   Collection Time: 08/12/16  5:57 PM  Result Value Ref Range Status   MRSA by PCR NEGATIVE NEGATIVE Final    Comment:        The GeneXpert MRSA Assay (FDA approved for NASAL specimens only), is one component of a comprehensive MRSA colonization surveillance program. It is not intended to diagnose MRSA infection nor to guide or monitor treatment for MRSA infections.      Time coordinating discharge: Over 30 minutes  SIGNED:   Erick Blinks, MD  [facility] 08/14/2016, 5:18 PM Pager   If 7PM-7AM, please contact night-coverage [email or web address] Password [email or web address]

## 2016-08-14 NOTE — Discharge Instructions (Signed)
Cooking With Less Salt Cooking with less salt is one way to reduce the amount of sodium you get from food. Sodium raises blood pressure and causes water to be held in the body. Getting less sodium from food may help lower your blood pressure, reduce any swelling, and protect your heart, liver, and kidneys.  WHAT DO I NEED TO KNOW ABOUT COOKING WITH LESS SALT?  Buy sodium-free or low-sodium products. Look on the label for the words:   Lower-sodium.  Sodium-free.   Sodium-reduced.   No salt added.   Unsalted.  Check the food label before using or buying packaged ingredients.  Look for products with no more than 150 mg of sodium in one serving.  Do not choose foods with salt as one of the first three ingredients on the ingredients list. If salt is one of the first three ingredients, it usually means the item is high in sodium because ingredients are listed in order of amount in the food item.  Use herbs, seasonings without salt, and spices as substitutes for salt in foods.  Use sodium-free baking soda when baking. WHAT ARE SOME SALT ALTERNATIVES? The following are herbs, seasonings, and spices that can be used instead of salt to give taste to your food. Next to their names are foods they can be used to flavor.  Herbs  Bay-Soups, meat and vegetable dishes, and spaghetti sauce.   Basil-Italian dishes, soups, pasta, and fish dishes.   Cilantro-Meat, poultry, and vegetable dishes.   Chili Powder-Marinades and Mexican dishes.   Chives-Salad dressings and potato dishes.   Cumin-Mexican dishes, couscous, and meat dishes.   Dill-Fish dishes, sauces, and salads.   Fennel-Meat and vegetable dishes, breads, and cookies.   Garlic (do not use garlic salt)-Italian dishes, meat dishes, salad dressings, and sauces.   Marjoram-Soups, potato dishes, and meat dishes.   Oregano-Pizza and spaghetti sauce.   Parsley-Salads, soups, pasta, and meat dishes.    Rosemary-Italian dishes, salad dressings, soups, and red meats.   Saffron-Fish dishes, pasta, and some poultry dishes.   Sage-Stuffings and sauces.   Tarragon-Fish and poultry dishes.   Thyme-Stuffing, meat, and fish dishes.  Herbs should be fresh or dried. Do not choose packaged mixes.  Seasonings  Lemon juice-Fish dishes, poultry dishes, vegetables, and salads.   Vinegar-Salad dressings, vegetables, and fish dishes.  Spices  Cinnamon-Sweet dishes (such as cakes, cookies, and puddings).   Cloves-Gingerbread, puddings, and marinades for meats.   Curry-Vegetable dishes, fish and poultry dishes, and stir-fry dishes.   Ginger-Vegetables dishes, fish dishes, and stir-fry dishes.   Nutmeg-Pasta, vegetables, poultry, fish dishes, and custard.  WHAT ARE SOME LOW-SODIUM INGREDIENTS AND FOODS?  Fresh or frozen fruits and vegetables with no sauce added.  Fresh or frozen whole meats, poultry, and fish with no sauce added.  Eggs.  Noodles, pasta, quinoa, rice.  Shredded or puffed wheat or puffed rice.  Regular or quick oats.  Milk, yogurt, low-sodium cheeses and hard cheeses (such as cheddar, Monterey Jack, or mozzarella). Always check the label for serving size and sodium content.  Unsalted butter or margarine.  Unsalted nuts.  Sherbet or ice cream (keep to  cup serving).  Homemade pudding.  Sodium-free baking soda and baking powder. This is not a complete list of low-sodium ingredients and foods. Contact your dietitian for more options.  WHAT HIGH-SODIUM INGREDIENTS ARE NOT RECOMMENDED?  Sauces, such as mustard, barbecue sauce, soy sauce, teriyaki sauce, steak sauce, chili sauce, cocktail sauce, and tartar sauce.  Mixes, such as   flavored rice.  Instant products, such as ready-made pasta.  Horseradish.  Salsa.   Packaged gravies.   Pickles.  Olives.  Sauerkraut.  Salted nuts.   Cured or smoked meats (such as hot dogs, bacon,  salami, ham, and bologna).   Processed vegetable juices, such as tomato juice.  Buttermilk.  Processed cheeses (such as cheese dips or cheese spread).  Cottage cheese.  Instant hot cereals.  Dessert mixes (ready-to-make) and store-bought cakes and pies.  Crackers with salted tops. This is not a complete list of high-sodium ingredients. Contact your dietitian for more options.    This information is not intended to replace advice given to you by your health care provider. Make sure you discuss any questions you have with your health care provider.   Document Released: 11/08/2005 Document Revised: 11/29/2014 Document Reviewed: 10/01/2013 Elsevier Interactive Patient Education 2016 Elsevier Inc.  

## 2016-08-14 NOTE — Progress Notes (Signed)
Patient being discharged. IV access removed and she is getting dressed. She is waiting for family to come pick her up which may be after 5 pm.

## 2016-09-08 ENCOUNTER — Ambulatory Visit (HOSPITAL_COMMUNITY)
Admission: RE | Admit: 2016-09-08 | Discharge: 2016-09-08 | Disposition: A | Discharge status: Home / Self Care | Payer: Medicare Other | Source: Ambulatory Visit | Attending: Physician Assistant | Admitting: Physician Assistant

## 2016-09-08 ENCOUNTER — Ambulatory Visit (INDEPENDENT_AMBULATORY_CARE_PROVIDER_SITE_OTHER): Payer: Medicare Other | Admitting: Physician Assistant

## 2016-09-08 ENCOUNTER — Encounter: Payer: Self-pay | Admitting: Physician Assistant

## 2016-09-08 VITALS — BP 162/90 | HR 61 | Ht 63.0 in | Wt 232.0 lb

## 2016-09-08 DIAGNOSIS — R0609 Other forms of dyspnea: Secondary | ICD-10-CM

## 2016-09-08 DIAGNOSIS — I10 Essential (primary) hypertension: Secondary | ICD-10-CM | POA: Insufficient documentation

## 2016-09-08 DIAGNOSIS — Z862 Personal history of diseases of the blood and blood-forming organs and certain disorders involving the immune mechanism: Secondary | ICD-10-CM | POA: Diagnosis not present

## 2016-09-08 DIAGNOSIS — I4891 Unspecified atrial fibrillation: Secondary | ICD-10-CM | POA: Diagnosis not present

## 2016-09-08 DIAGNOSIS — Z720 Tobacco use: Secondary | ICD-10-CM | POA: Diagnosis not present

## 2016-09-08 DIAGNOSIS — I481 Persistent atrial fibrillation: Secondary | ICD-10-CM

## 2016-09-08 DIAGNOSIS — I4819 Other persistent atrial fibrillation: Secondary | ICD-10-CM

## 2016-09-08 DIAGNOSIS — Z8719 Personal history of other diseases of the digestive system: Secondary | ICD-10-CM | POA: Diagnosis not present

## 2016-09-08 DIAGNOSIS — R002 Palpitations: Secondary | ICD-10-CM | POA: Insufficient documentation

## 2016-09-08 MED ORDER — AMLODIPINE BESYLATE 5 MG PO TABS: 5.0000 mg | ORAL_TABLET | Freq: Every day | ORAL | 3 refills | Status: DC

## 2016-09-08 NOTE — Patient Instructions (Signed)
Medication Instructions:  Start norvasc 5 mg daily -(  if you have the 10 mg tablets take 1/2 tablet )   Labwork: none  Testing/Procedures: Your physician has recommended that you wear a holter monitor. Holter monitors are medical devices that record the heart's electrical activity. Doctors most often use these monitors to diagnose arrhythmias. Arrhythmias are problems with the speed or rhythm of the heartbeat. The monitor is a small, portable device. You can wear one while you do your normal daily activities. This is usually used to diagnose what is causing palpitations/syncope (passing out).    Follow-Up: Your physician recommends that you schedule a follow-up appointment in: 1 month   Any Other Special Instructions Will Be Listed Below (If Applicable).     If you need a refill on your cardiac medications before your next appointment, please call your pharmacy.

## 2016-09-08 NOTE — Progress Notes (Signed)
Cardiology Office Note    Date:  09/08/2016   ID:  Andrea [name], DOB 03-06-1941, MRN [id number]  PCP:  Ignatius Specking, MD  Cardiologist: Dr. Purvis Sheffield  Chief Complaint  Patient presents with  . Follow-up    History of Present Illness:  Andrea [name] is a 75 y.o. female with history of atrial fibrillation, normal coronary arteries and LV systolic function 12/24/14 [facility], normal stress test in 2014 admitted to the hospital 08/12/16 with rapid atrial fibrillation. She was only taking her metoprolol when necessary. She is not a good candidate for anticoagulation due to anemia and history of GI bleed. She also has hypertension, obesity, anxiety and tobacco abuse. CHADSVASC=4. Heart rate was controlled with IV diltiazem and then she was started on her home dose of Lopressor and heart rates remained stable. Echo was unremarkable and TSH was normal.  Patient's Norvasc was stopped and her blood pressures have been running high since she's been home. She also complains of dyspnea on exertion. She says her heart still races some but is much better controlled since she is on daily metoprolol. She says she gets short of breath walking around. We walked her in the office with a pulse ox and her heart rate stayed at 88 and O2 sat dropped from 97-96%. She was short of breath. She was told that she may have COPD from all her years of smoking. Quit 2 years ago.   Past Medical History:  Diagnosis Date  . Anemia    secondary to acute on chronic GI bleed  . Anxiety disorder   . Diverticulosis   . Exertional dyspnea    Normal LVF, 2-D echo, 09/2012  . Hiatal hernia   . Hypercholesterolemia   . Hypertension   . Obesity   . Osteoarthritis   . Tobacco abuse     Past Surgical History:  Procedure Laterality Date  . ABDOMINAL HYSTERECTOMY    . GIVENS CAPSULE STUDY N/A 02/19/2013   Procedure: GIVENS CAPSULE STUDY;  Surgeon: Malissa Hippo, MD;  Location: [facility];  Service:  Endoscopy;  Laterality: N/A;  730    Current Medications: Outpatient Medications Prior to Visit  Medication Sig Dispense Refill  . BREO ELLIPTA 100-25 MCG/INH AEPB Inhale 1 puff into the lungs daily.     . clonazePAM (KLONOPIN) 0.5 MG tablet Take 0.5 mg by mouth 2 (two) times daily as needed for anxiety.     . metoprolol (LOPRESSOR) 50 MG tablet Take 1 tablet (50 mg total) by mouth 2 (two) times daily. 60 tablet 0  . naproxen sodium (ALEVE) 220 MG tablet Take 220-440 mg by mouth daily as needed. For pain    . OXYGEN Inhale 3 L into the lungs at bedtime.    Marland Kitchen tetrahydrozoline-zinc (VISINE-AC) 0.05-0.25 % ophthalmic solution Place 2 drops into both eyes 3 (three) times daily as needed (Red Itchy Eyes).     No facility-administered medications prior to visit.      Allergies:   Shellfish allergy   Social History   Social History  . Marital status: Widowed    Spouse name: N/A  . Number of children: N/A  . Years of education: N/A   Social History Main Topics  . Smoking status: Former Smoker    Packs/day: 0.50    Years: 20.00    Types: Cigarettes    Quit date: 06/22/2012  . Smokeless tobacco: Never Used  . Alcohol use No  . Drug use: No  .  Sexual activity: Not Asked   Other Topics Concern  . None   Social History Narrative  . None     Family History:  The patient's   family history includes Hypertension in her brother.   ROS:   Please see the history of present illness.    Review of Systems  Constitution: Negative.  HENT: Negative.   Eyes: Negative.   Cardiovascular: Positive for dyspnea on exertion, irregular heartbeat and palpitations.  Respiratory: Positive for shortness of breath.   Hematologic/Lymphatic: Negative.   Musculoskeletal: Negative.  Negative for joint pain.  Gastrointestinal: Negative.   Genitourinary: Negative.   Neurological: Negative.    All other systems reviewed and are negative.   PHYSICAL EXAM:   VS:  BP (!) 162/90   Pulse 61   Ht 5\' 3"   (1.6 m)   Wt 232 lb (105.2 kg)   SpO2 91%   BMI 41.10 kg/m   Physical Exam  GEN: Obese, in no acute distress  Neck: no JVD, carotid bruits, or masses Cardiac: Irregular irregular; no murmurs, rubs, or gallops  Respiratory:  Decreased breath sounds but clear to auscultation bilaterally, normal work of breathing GI: soft, nontender, nondistended, + BS Ext: without cyanosis, clubbing, or edema, Good distal pulses bilaterally MS: no deformity or atrophy  Skin: warm and dry, no rash Psych: euthymic mood, full affect  Wt Readings from Last 3 Encounters:  09/08/16 232 lb (105.2 kg)  08/14/16 233 lb 0.4 oz (105.7 kg)  08/12/16 238 lb (108 kg)      Studies/Labs Reviewed:   EKG:  EKG isNot ordered today.    Recent Labs: 08/12/2016: ALT 16; B Natriuretic Peptide 173.0; Hemoglobin 13.1; Magnesium 1.9; Platelets 278; TSH 1.303 08/13/2016: BUN 11; Creatinine, Ser 0.80; Potassium 3.8; Sodium 137   Lipid Panel No results found for: CHOL, TRIG, HDL, CHOLHDL, VLDL, LDLCALC, LDLDIRECT  Additional studies/ records that were reviewed today include:  2-D echo 08/13/16 Study Conclusions   - Left ventricle: The cavity size was normal. Wall thickness was   increased in a pattern of moderate LVH. Systolic function was   normal. The estimated ejection fraction was in the range of 50%   to 55%. Wall motion was normal; there were no regional wall   motion abnormalities. The study was not technically sufficient to   allow evaluation of LV diastolic dysfunction due to atrial   fibrillation. - Aortic valve: Mildly calcified annulus. Trileaflet; mildly   thickened leaflets. There was mild regurgitation. Valve area   (VTI): 1.63 cm^2. Valve area (Vmax): 1.58 cm^2. Valve area   (Vmean): 1.64 cm^2. - Mitral valve: Mildly calcified annulus. Mildly thickened leaflets   . There was mild regurgitation. - Left atrium: The atrium was mildly dilated. - Right ventricle: The cavity size was mildly dilated. -  Right atrium: The atrium was mildly dilated. - Tricuspid valve: There was moderate regurgitation. The TR VC is   0.5 cm. - Pulmonary arteries: Systolic pressure was mildly increased. PA   peak pressure: 31 mm Hg (S). - Technically adequate study.       ASSESSMENT:    1. Palpitations   2. Persistent atrial fibrillation (HCC)   3. Essential hypertension   4. Dyspnea on exertion      PLAN:  In order of problems listed above:  Patient continues to have occasional palpitations with her atrial fibrillation her heart rate is controlled while we walked her around the office today. We'll place 48-hour monitor to see that her atrial  fibrillation is controlled.  Persistent atrial fibrillation not on anticoagulation because of previous GI bleeds and anemia. Checking Holter to make sure her heart rate controlled on metoprolol 50 mg twice a day. F/U with Dr. Purvis Sheffield in 1 month.  Essential hypertension elevated. We'll resume Norvasc 10 mg half a tablet daily. She will monitor her blood pressures at home and call us if they remain increased. We can always go up to 10 mg of Norvasc if needed.  Dyspnea on exertion patient obviously got short of breath when walking her around the office today. Her O2 sat did not drop dramatically and her heart rate was controlled. Will place 48 hour monitor to make sure her heart rates are controlled as an outpatient. If so she may need to be referred to pulmonary for COPD.    Medication Adjustments/Labs and Tests Ordered: Current medicines are reviewed at length with the patient today.  Concerns regarding medicines are outlined above.  Medication changes, Labs and Tests ordered today are listed in the Patient Instructions below. Patient Instructions  Medication Instructions:  Start norvasc 5 mg daily -(  if you have the 10 mg tablets take 1/2 tablet )   Labwork: none  Testing/Procedures: Your physician has recommended that you wear a holter monitor.  Holter monitors are medical devices that record the heart's electrical activity. Doctors most often use these monitors to diagnose arrhythmias. Arrhythmias are problems with the speed or rhythm of the heartbeat. The monitor is a small, portable device. You can wear one while you do your normal daily activities. This is usually used to diagnose what is causing palpitations/syncope (passing out).    Follow-Up: Your physician recommends that you schedule a follow-up appointment in: 1 month   Any Other Special Instructions Will Be Listed Below (If Applicable).     If you need a refill on your cardiac medications before your next appointment, please call your pharmacy.      Signed, Jacolyn Reedy, PA-C  09/08/2016 1:27 PM    [facility] [address] [place], [place], [place]  [address] Phone: [phone]; Fax: [phone]

## 2016-09-15 ENCOUNTER — Telehealth: Payer: Self-pay

## 2016-09-15 NOTE — Telephone Encounter (Signed)
Pt made aware to increase metoprolol to 75 mg  in the am and 50 mg in the pm. Made aware results to heart monitor.

## 2016-10-06 DIAGNOSIS — Z299 Encounter for prophylactic measures, unspecified: Secondary | ICD-10-CM | POA: Diagnosis not present

## 2016-10-06 DIAGNOSIS — F419 Anxiety disorder, unspecified: Secondary | ICD-10-CM | POA: Diagnosis not present

## 2016-10-06 DIAGNOSIS — I4891 Unspecified atrial fibrillation: Secondary | ICD-10-CM | POA: Diagnosis not present

## 2016-10-06 DIAGNOSIS — M1710 Unilateral primary osteoarthritis, unspecified knee: Secondary | ICD-10-CM | POA: Diagnosis not present

## 2016-10-06 DIAGNOSIS — D509 Iron deficiency anemia, unspecified: Secondary | ICD-10-CM | POA: Diagnosis not present

## 2016-10-06 DIAGNOSIS — J449 Chronic obstructive pulmonary disease, unspecified: Secondary | ICD-10-CM | POA: Diagnosis not present

## 2016-10-26 ENCOUNTER — Ambulatory Visit (INDEPENDENT_AMBULATORY_CARE_PROVIDER_SITE_OTHER): Payer: Medicare Other | Admitting: Cardiovascular Disease

## 2016-10-26 ENCOUNTER — Encounter: Payer: Self-pay | Admitting: Cardiovascular Disease

## 2016-10-26 VITALS — BP 158/84 | HR 73 | Ht 63.0 in | Wt 235.0 lb

## 2016-10-26 DIAGNOSIS — I4891 Unspecified atrial fibrillation: Secondary | ICD-10-CM | POA: Diagnosis not present

## 2016-10-26 DIAGNOSIS — I10 Essential (primary) hypertension: Secondary | ICD-10-CM | POA: Diagnosis not present

## 2016-10-26 DIAGNOSIS — R0609 Other forms of dyspnea: Secondary | ICD-10-CM | POA: Diagnosis not present

## 2016-10-26 MED ORDER — DILTIAZEM HCL ER COATED BEADS 240 MG PO CP24: 240.0000 mg | ORAL_CAPSULE | Freq: Every day | ORAL | 6 refills | Status: DC

## 2016-10-26 NOTE — Progress Notes (Signed)
SUBJECTIVE: The patient presents for follow up of atrial fibrillation after being hospitalized for this. She wore a Holter monitor which showed some tachycardia and metoprolol was increased to 75 mg in the morning and 50 mg in the evening.  She continues to experience exertional dyspnea. 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 She has a headache and feels off-balance. Blood pressure is 158/84.   Review of Systems: As per "subjective", otherwise negative.  Allergies  Allergen Reactions  . Shellfish Allergy Hives, Itching and Swelling    Current Outpatient Prescriptions  Medication Sig Dispense Refill  . amLODipine (NORVASC) 5 MG tablet Take 1 tablet (5 mg total) by mouth daily. 180 tablet 3  . BREO ELLIPTA 100-25 MCG/INH AEPB Inhale 1 puff into the lungs daily.     . clonazePAM (KLONOPIN) 0.5 MG tablet Take 0.5 mg by mouth 2 (two) times daily as needed for anxiety.     . metoprolol (LOPRESSOR) 50 MG tablet Take 1 tablet (50 mg total) by mouth 2 (two) times daily. 60 tablet 0  . naproxen sodium (ALEVE) 220 MG tablet Take 220-440 mg by mouth daily as needed. For pain    . OXYGEN Inhale 3 L into the lungs at bedtime.    Marland Kitchen. tetrahydrozoline-zinc (VISINE-AC) 0.05-0.25 % ophthalmic solution Place 2 drops into both eyes 3 (three) times daily as needed (Red Itchy Eyes).     No current facility-administered medications for this visit.     Past Medical History:  Diagnosis Date  . Anemia    secondary to acute on chronic GI bleed  . Anxiety disorder   . Diverticulosis   . Exertional dyspnea    Normal LVF, 2-D echo, 09/2012  . Hiatal hernia   . Hypercholesterolemia   . Hypertension   . Obesity   . Osteoarthritis   . Tobacco abuse     Past Surgical History:  Procedure Laterality Date  . ABDOMINAL HYSTERECTOMY    . GIVENS CAPSULE STUDY N/A 02/19/2013   Procedure: GIVENS CAPSULE STUDY;  Surgeon: Malissa HippoNajeeb U Rehman, MD;  [facility];  Service: Endoscopy;  Laterality: N/A;   730    Social History   Social History  . Marital status: Widowed    Spouse name: N/A  . Number of children: N/A  . Years of education: N/A   Occupational History  . Not on file.   Social History Main Topics  . Smoking status: Former Smoker    Packs/day: 0.50    Years: 20.00    Types: Cigarettes    Quit date: 06/22/2012  . Smokeless tobacco: Never Used  . Alcohol use No  . Drug use: No  . Sexual activity: Not on file   Other Topics Concern  . Not on file   Social History Narrative  . No narrative on file     Vitals:   10/26/16 1306  BP: (!) 158/84  Pulse: 73  SpO2: 97%  Weight: 235 lb (106.6 kg)  Height: 5\' 3"  (1.6 m)    PHYSICAL EXAM General: NAD HEENT: Normal. Neck: No JVD, no thyromegaly. Lungs: Diminished, no rales/wheezes. CV: Nondisplaced PMI.  Upper normal rate and irregular rhythm, normal S1/S2, no S3, no murmur. No pretibial or periankle edema.    Abdomen: Soft, obese.  Neurologic: Alert and oriented.  Psych: Normal affect. Skin: Normal. Musculoskeletal: No gross deformities.    ECG: Most recent ECG reviewed.      ASSESSMENT AND PLAN: 1. Atrial fibrillation: HR  suboptimally controlled. Given her history of COPD, I do not want to increase beta blockers as this may precipitate bronchospastic disease. I will discontinue metoprolol and initiate long-acting diltiazem 240 mg daily. Not a good candidate for anticoagulation due to a history of anemia and GI bleeding. Will refer to EP for consideration of Watchman device. Left atrium was only mildly dilated.  2. Hypertension: Elevated. Monitor given switch to long-acting diltiazem.  Dispo: fu 6 wks   Prentice DockerSuresh Koneswaran, M.D., F.A.C.C.

## 2016-10-26 NOTE — Patient Instructions (Signed)
Medication Instructions:   Stop Metoprolol.  Begin Diltiazem CD 240mg  daily.  Continue all other medications.    Labwork: none  Testing/Procedures: none  Referrals:   Dr. Johney FrameAllred for Watchman device evaluation.    Follow-Up: 6 weeks   Any Other Special Instructions Will Be Listed Below (If Applicable).  If you need a refill on your cardiac medications before your next appointment, please call your pharmacy.

## 2016-10-28 ENCOUNTER — Telehealth: Payer: Self-pay | Admitting: Cardiovascular Disease

## 2016-10-28 NOTE — Telephone Encounter (Signed)
No answer

## 2016-10-28 NOTE — Telephone Encounter (Signed)
[name] called wanting to know about taking her medications.

## 2016-11-01 DIAGNOSIS — I4891 Unspecified atrial fibrillation: Secondary | ICD-10-CM | POA: Diagnosis not present

## 2016-11-01 DIAGNOSIS — M159 Polyosteoarthritis, unspecified: Secondary | ICD-10-CM | POA: Diagnosis not present

## 2016-11-01 DIAGNOSIS — J449 Chronic obstructive pulmonary disease, unspecified: Secondary | ICD-10-CM | POA: Diagnosis not present

## 2016-11-01 DIAGNOSIS — I10 Essential (primary) hypertension: Secondary | ICD-10-CM | POA: Diagnosis not present

## 2016-11-03 NOTE — Telephone Encounter (Signed)
Returned call to patient.  She was concerned about taking two BP pills.  Explained to her that Dr. Purvis SheffieldKoneswaran stopped her Metoprolol and changed to long acting Diltiazem due to heart rate being suboptimally controlled.  She will continue her Amlodipine 5mg  daily & Diltiazem CD 240mg  daily.  She will see Dr. Johney FrameAllred on 12/01/2016 to discuss Watchman device & SK on 12/14/2015 for follow up.

## 2016-11-10 ENCOUNTER — Encounter: Payer: Self-pay | Admitting: *Deleted

## 2016-12-01 ENCOUNTER — Institutional Professional Consult (permissible substitution): Payer: No Typology Code available for payment source | Admitting: Internal Medicine

## 2016-12-09 ENCOUNTER — Institutional Professional Consult (permissible substitution): Payer: No Typology Code available for payment source | Admitting: Internal Medicine

## 2016-12-13 ENCOUNTER — Ambulatory Visit: Payer: No Typology Code available for payment source | Admitting: Cardiovascular Disease

## 2016-12-20 ENCOUNTER — Encounter: Payer: Self-pay | Admitting: Internal Medicine

## 2016-12-20 ENCOUNTER — Ambulatory Visit (INDEPENDENT_AMBULATORY_CARE_PROVIDER_SITE_OTHER): Payer: Medicare Other | Admitting: Internal Medicine

## 2016-12-20 VITALS — BP 144/80 | HR 79 | Ht 63.0 in | Wt 235.0 lb

## 2016-12-20 DIAGNOSIS — I4891 Unspecified atrial fibrillation: Secondary | ICD-10-CM

## 2016-12-20 NOTE — Progress Notes (Signed)
Electrophysiology Office Note   Date:  12/20/2016   ID:  Andrea AreaYvonne H Gaymon, DOB Jul 17, 1941, MRN [id number]  PCP:  Ignatius Speckinghruv B Vyas, MD  Cardiologist:  Dr Purvis SheffieldKoneswaran Primary Electrophysiologist: Hillis RangeJames Zyonna Vardaman, MD    Chief Complaint  Patient presents with  . Atrial Fibrillation    Watchman Consult     History of Present Illness: Andrea [name] is a 76 y.o. female who presents today for electrophysiology evaluation.   The patient is unable to help much with history today.  She appears to have persistent afib for which Dr Purvis SheffieldKoneswaran is working to rate control.  She has morbid obesity and COPD with chronic SOB.  She is not very active.  She has a h/o anemia but is unable to describe this to me.  She states "they think I have a leak".   She says "I swallowed a camera but it got lost in there".  She does not recall taking anticoagulation previously.  Last hematocrit in epic was 40 four months ago.  Records in epic suggest that she may have had anemia previously attributed to heavy NSAID use.   Today, she denies symptoms of palpitations, chest pain,  orthopnea, PND, lower extremity edema, claudication, dizziness, presyncope, syncope, bleeding, or neurologic sequela. The patient is tolerating medications without difficulties and is otherwise without complaint today.    Past Medical History:  Diagnosis Date  . Anemia    secondary to acute on chronic GI bleed  . Anxiety disorder   . Diverticulosis   . Exertional dyspnea    Normal LVF, 2-D echo, 09/2012  . Hiatal hernia   . Hypercholesterolemia   . Hypertension   . Obesity   . Osteoarthritis   . Tobacco abuse    Past Surgical History:  Procedure Laterality Date  . ABDOMINAL HYSTERECTOMY    . GIVENS CAPSULE STUDY N/A 02/19/2013   Procedure: GIVENS CAPSULE STUDY;  Surgeon: Malissa HippoNajeeb U Rehman, MD;  [facility];  Service: Endoscopy;  Laterality: N/A;  730     Current Outpatient Prescriptions  Medication Sig Dispense Refill  .  BREO ELLIPTA 100-25 MCG/INH AEPB Inhale 1 puff into the lungs daily.     . clonazePAM (KLONOPIN) 0.5 MG tablet Take 0.5 mg by mouth 2 (two) times daily as needed for anxiety.     Marland Kitchen. diltiazem (DILTIAZEM CD) 240 MG 24 hr capsule Take 1 capsule (240 mg total) by mouth daily. 30 capsule 6  . naproxen sodium (ALEVE) 220 MG tablet Take 220-440 mg by mouth daily as needed. For pain    . OXYGEN Inhale 3 L into the lungs at bedtime.    Marland Kitchen. tetrahydrozoline-zinc (VISINE-AC) 0.05-0.25 % ophthalmic solution Place 2 drops into both eyes 3 (three) times daily as needed (Red Itchy Eyes).    Marland Kitchen. amLODipine (NORVASC) 5 MG tablet Take 1 tablet (5 mg total) by mouth daily. 180 tablet 3   No current facility-administered medications for this visit.     Allergies:   Shellfish allergy   Social History:  The patient  reports that she quit smoking about 4 years ago. Her smoking use included Cigarettes. She has a 10.00 pack-year smoking history. She has never used smokeless tobacco. She reports that she does not drink alcohol or use drugs.   Family History:  The patient's  family history includes Hypertension in her brother.    ROS:  Please see the history of present illness.   All other systems are reviewed and negative.  PHYSICAL EXAM: VS:  BP (!) 144/80   Pulse 79   Ht 5\' 3"  (1.6 m)   Wt 235 lb (106.6 kg)   BMI 41.63 kg/m  , BMI Body mass index is 41.63 kg/m. GEN: morbidly obese, in no acute distress  HEENT: normal  Neck: no JVD, carotid bruits, or masses Cardiac: tachycardic irregular rhythm Respiratory:  clear to auscultation bilaterally, normal work of breathing GI: soft, nontender, nondistended, + BS MS: no deformity or atrophy  Skin: warm and dry  Neuro:  Strength and sensation are intact Psych: euthymic mood, full affect   Recent Labs: 08/12/2016: ALT 16; B Natriuretic Peptide 173.0; Hemoglobin 13.1; Magnesium 1.9; Platelets 278; TSH 1.303 08/13/2016: BUN 11; Creatinine, Ser 0.80; Potassium  3.8; Sodium 137   Lipid Panel  No results found for: CHOL, TRIG, HDL, CHOLHDL, VLDL, LDLCALC, LDLDIRECT   Wt Readings from Last 3 Encounters:  12/20/16 235 lb (106.6 kg)  10/26/16 235 lb (106.6 kg)  09/08/16 232 lb (105.2 kg)     Other studies Reviewed: Additional studies/ records that were reviewed today include: epic records, prior echo, Dr Junius Argyle notes  Review of the above records today demonstrates: as above   ASSESSMENT AND PLAN:  1.  Persistent afib The patient has persistent afib.  Rate control strategy as per Dr Purvis Sheffield. Unfortunately, I am unable from the patient to determine if she is a candidate for long term anticoagulation or even for anticoagulation to allow for Watchman.  Her chads2vasc score is 4.  I would prefer initiation of eliquis with avoidance of NSAIDs if this is an option. I will ask Gypsy Balsam NP to follow-up with prior GI physicians to see if we can determine their opinion.  2. Morbidly obesity Weight loss strongly advised  Follow-up with Dr Purvis Sheffield as scheduled EP NP to follow-up on possibility of long term anticoagulation  Current medicines are reviewed at length with the patient today.   The patient does not have concerns regarding her medicines.  The following changes were made today:  none  Signed, Hillis Range, MD  12/20/2016 11:10 AM     [facility] [address] [place] [address] [phone] (office) [phone] (fax)

## 2016-12-20 NOTE — Patient Instructions (Signed)
Medication Instructions:  Your physician recommends that you continue on your current medications as directed. Please refer to the Current Medication list given to you today.  Labwork: None ordered.  Testing/Procedures: None ordered.  Follow-Up: Your physician recommends that you schedule a follow-up appointment as needed.   Any Other Special Instructions Will Be Listed Below (If Applicable).     If you need a refill on your cardiac medications before your next appointment, please call your pharmacy.   

## 2016-12-23 ENCOUNTER — Institutional Professional Consult (permissible substitution): Payer: No Typology Code available for payment source | Admitting: Internal Medicine

## 2017-01-18 DIAGNOSIS — I10 Essential (primary) hypertension: Secondary | ICD-10-CM | POA: Diagnosis not present

## 2017-01-18 DIAGNOSIS — Z6838 Body mass index (BMI) 38.0-38.9, adult: Secondary | ICD-10-CM | POA: Diagnosis not present

## 2017-01-18 DIAGNOSIS — D649 Anemia, unspecified: Secondary | ICD-10-CM | POA: Diagnosis not present

## 2017-01-18 DIAGNOSIS — J449 Chronic obstructive pulmonary disease, unspecified: Secondary | ICD-10-CM | POA: Diagnosis not present

## 2017-01-18 DIAGNOSIS — R42 Dizziness and giddiness: Secondary | ICD-10-CM | POA: Diagnosis not present

## 2017-01-18 DIAGNOSIS — Z299 Encounter for prophylactic measures, unspecified: Secondary | ICD-10-CM | POA: Diagnosis not present

## 2017-01-18 DIAGNOSIS — I4891 Unspecified atrial fibrillation: Secondary | ICD-10-CM | POA: Diagnosis not present

## 2017-01-18 DIAGNOSIS — Z713 Dietary counseling and surveillance: Secondary | ICD-10-CM | POA: Diagnosis not present

## 2017-02-16 ENCOUNTER — Encounter: Payer: Self-pay | Admitting: Cardiovascular Disease

## 2017-02-16 ENCOUNTER — Ambulatory Visit (INDEPENDENT_AMBULATORY_CARE_PROVIDER_SITE_OTHER): Payer: Medicare Other | Admitting: Cardiovascular Disease

## 2017-02-16 VITALS — BP 152/100 | HR 102 | Ht 63.0 in | Wt 236.0 lb

## 2017-02-16 DIAGNOSIS — I4891 Unspecified atrial fibrillation: Secondary | ICD-10-CM | POA: Diagnosis not present

## 2017-02-16 DIAGNOSIS — Z91148 Patient's other noncompliance with medication regimen for other reason: Secondary | ICD-10-CM

## 2017-02-16 DIAGNOSIS — I10 Essential (primary) hypertension: Secondary | ICD-10-CM | POA: Diagnosis not present

## 2017-02-16 DIAGNOSIS — R0609 Other forms of dyspnea: Secondary | ICD-10-CM | POA: Diagnosis not present

## 2017-02-16 DIAGNOSIS — R002 Palpitations: Secondary | ICD-10-CM

## 2017-02-16 DIAGNOSIS — Z9114 Patient's other noncompliance with medication regimen: Secondary | ICD-10-CM

## 2017-02-16 NOTE — Progress Notes (Signed)
SUBJECTIVE: The patient presents for follow-up of atrial fibrillation and hypertension. 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 I had her referred to Dr. Johney Frame for consideration of Watchman device. He was going to have his nurse practitioner follow-up with GI to see if long-term anticoagulation was possible.  She comes in today complaining of palpitations and marked exertional dyspnea. She also has a sensation of pressure at the top of her chest and neck. She said when she takes her clonazepam it calms her down.  She did not take her medications this morning which include amlodipine and diltiazem.  ECG performed in the office today which I ordered and personally interpreted demonstrates rapid atrial fibrillation, 122 bpm, with diffuse nonspecific T wave abnormalities..    Review of Systems: As per "subjective", otherwise negative.  Allergies  Allergen Reactions  . Shellfish Allergy Hives, Itching and Swelling    Current Outpatient Prescriptions  Medication Sig Dispense Refill  . amLODipine (NORVASC) 10 MG tablet Take 5 mg by mouth daily.    Marland Kitchen BREO ELLIPTA 100-25 MCG/INH AEPB Inhale 1 puff into the lungs daily.     . clonazePAM (KLONOPIN) 0.5 MG tablet Take 0.5 mg by mouth 2 (two) times daily as needed for anxiety.     Marland Kitchen diltiazem (DILTIAZEM CD) 240 MG 24 hr capsule Take 1 capsule (240 mg total) by mouth daily. 30 capsule 6  . naproxen sodium (ALEVE) 220 MG tablet Take 220-440 mg by mouth daily as needed. For pain    . OXYGEN Inhale 3 L into the lungs at bedtime.    Marland Kitchen tetrahydrozoline-zinc (VISINE-AC) 0.05-0.25 % ophthalmic solution Place 2 drops into both eyes 3 (three) times daily as needed (Red Itchy Eyes).     No current facility-administered medications for this visit.     Past Medical History:  Diagnosis Date  . Anemia    secondary to acute on chronic GI bleed  . Anxiety disorder   . Diverticulosis   . Exertional dyspnea    Normal LVF, 2-D echo,  09/2012  . Hiatal hernia   . Hypercholesterolemia   . Hypertension   . Obesity   . Osteoarthritis   . Tobacco abuse     Past Surgical History:  Procedure Laterality Date  . ABDOMINAL HYSTERECTOMY    . GIVENS CAPSULE STUDY N/A 02/19/2013   Procedure: GIVENS CAPSULE STUDY;  Surgeon: Malissa Hippo, MD;  [facility]: [facility];  Service: Endoscopy;  Laterality: N/A;  730    Social History   Social History  . Marital status: Widowed    Spouse name: N/A  . Number of children: N/A  . Years of education: N/A   Occupational History  . Not on file.   Social History Main Topics  . Smoking status: Former Smoker    Packs/day: 0.50    Years: 20.00    Types: Cigarettes    Quit date: 06/22/2012  . Smokeless tobacco: Never Used  . Alcohol use No  . Drug use: No  . Sexual activity: Not on file   Other Topics Concern  . Not on file   Social History Narrative  . No narrative on file     Vitals:   02/16/17 1053 02/16/17 1058  BP: (!) 150/112 (!) 152/100  Pulse: 90 (!) 102  SpO2: (!) 15% 96%  Weight: 236 lb (107 kg)   Height: 5\' 3"  (1.6 m)     PHYSICAL EXAM General: NAD HEENT: Normal. Neck: No  JVD, no thyromegaly. Lungs: Diminished throughout, no rales or wheezes. CV: Nondisplaced PMI.  Tachycardic, irregular rhythm, normal S1/S2, no S3, no murmur. No pretibial or periankle edema.   Abdomen: Soft, nontender, obese, no distention.  Neurologic: Alert and oriented.  Psych: Normal affect. Skin: Normal. Musculoskeletal: No gross deformities.    ECG: Most recent ECG reviewed.      ASSESSMENT AND PLAN: 1. Rapid atrial fibrillation: Unfortunately, she did not take her long-acting diltiazem. She normally takes in the morning. I reinforced the importance of taking her medications as prescribed. I will have her return within the next one or two days for an ECG and blood pressure check in the afternoon.  2. Hypertension: It remains elevated. However, she did not take her  amlodipine. I will have her return within the next few days to have her blood pressure checked in the afternoon. I reinforced the importance of taking her medications as prescribed.  3. Medication noncompliance: I reinforced the importance of taking her medications as prescribed.  4. Morbid obesity: Needs weight loss.   Dispo: fu 1 month.   Prentice DockerSuresh Tarica Harl, M.D., F.A.C.C.

## 2017-02-16 NOTE — Patient Instructions (Signed)
Your physician recommends that you schedule a follow-up appointment in: 1 MONTH WITH DR Purvis SheffieldKONESWARAN  Your physician recommends that you continue on your current medications as directed. Please refer to the Current Medication list given to you today.  NURSE VISIT BLOOD PRESSURE AND EKG IN THE AFTERNOON -  MAKE SURE YOU HAVE TAKEN YOUR MEDICATIONS IN THE MORNING BEFORE YOUR NURSE VISIT   Thank you for choosing [facility]!!

## 2017-02-18 ENCOUNTER — Ambulatory Visit (INDEPENDENT_AMBULATORY_CARE_PROVIDER_SITE_OTHER): Payer: Medicare Other | Admitting: *Deleted

## 2017-02-18 VITALS — BP 124/88 | HR 83

## 2017-02-18 DIAGNOSIS — I4891 Unspecified atrial fibrillation: Secondary | ICD-10-CM | POA: Diagnosis not present

## 2017-02-18 DIAGNOSIS — I10 Essential (primary) hypertension: Secondary | ICD-10-CM | POA: Diagnosis not present

## 2017-02-18 NOTE — Progress Notes (Signed)
Patient in office this afternoon for nurse BP check & EKG.  Last seen by Dr. Purvis Sheffield on 02/16/2017.  Patient had not taken her Amlodipine or Diltiazem at this visit.  She has taken those medications today.  Stated that she does feel her heart rate going up & down.  Still feels the same as she did at that OV, no better / no worse.

## 2017-02-18 NOTE — Progress Notes (Signed)
Patient of Dr. Purvis Sheffield - off this afternoon. I reviewed his recent note. Patient returns for a blood pressure check, heart rate is controlled and systolic blood pressures in the 120s having taken both amlodipine and diltiazem CD. Forwarding note to Dr. Purvis Sheffield for further recommendations.

## 2017-02-23 NOTE — Progress Notes (Signed)
Please have her seen by NP in a fib clinic.

## 2017-02-25 NOTE — Progress Notes (Signed)
Left message to return call 

## 2017-03-01 NOTE — Progress Notes (Signed)
2nd attempt to reach patient, lmtcb.

## 2017-03-03 NOTE — Progress Notes (Signed)
Patient notified this morning.  She agrees to be seen by the [facility] Fib clinic.  Referral will be put into EPIC.

## 2017-03-03 NOTE — Progress Notes (Signed)
3rd attempt to reach patient, lmtcb.

## 2017-03-07 ENCOUNTER — Telehealth (HOSPITAL_COMMUNITY): Payer: Self-pay | Admitting: *Deleted

## 2017-03-07 NOTE — Telephone Encounter (Signed)
Pt sched for afib appt.

## 2017-03-11 DIAGNOSIS — M25562 Pain in left knee: Secondary | ICD-10-CM | POA: Diagnosis not present

## 2017-03-15 ENCOUNTER — Ambulatory Visit (HOSPITAL_COMMUNITY): Payer: Medicare Other | Admitting: Nurse Practitioner

## 2017-03-23 ENCOUNTER — Encounter (HOSPITAL_COMMUNITY): Payer: Self-pay | Admitting: Nurse Practitioner

## 2017-03-23 ENCOUNTER — Ambulatory Visit (HOSPITAL_COMMUNITY)
Admission: RE | Admit: 2017-03-23 | Discharge: 2017-03-23 | Disposition: A | Discharge status: Home / Self Care | Payer: Medicare Other | Source: Ambulatory Visit | Attending: Nurse Practitioner | Admitting: Nurse Practitioner

## 2017-03-23 VITALS — BP 142/92 | HR 134 | Ht 63.0 in | Wt 235.2 lb

## 2017-03-23 DIAGNOSIS — I4891 Unspecified atrial fibrillation: Secondary | ICD-10-CM | POA: Diagnosis not present

## 2017-03-23 DIAGNOSIS — E7800 Pure hypercholesterolemia, unspecified: Secondary | ICD-10-CM | POA: Diagnosis not present

## 2017-03-23 DIAGNOSIS — I481 Persistent atrial fibrillation: Secondary | ICD-10-CM | POA: Diagnosis not present

## 2017-03-23 DIAGNOSIS — R0683 Snoring: Secondary | ICD-10-CM | POA: Diagnosis not present

## 2017-03-23 DIAGNOSIS — K449 Diaphragmatic hernia without obstruction or gangrene: Secondary | ICD-10-CM | POA: Diagnosis not present

## 2017-03-23 DIAGNOSIS — G4730 Sleep apnea, unspecified: Secondary | ICD-10-CM | POA: Diagnosis not present

## 2017-03-23 DIAGNOSIS — M1990 Unspecified osteoarthritis, unspecified site: Secondary | ICD-10-CM | POA: Insufficient documentation

## 2017-03-23 DIAGNOSIS — Z87891 Personal history of nicotine dependence: Secondary | ICD-10-CM | POA: Diagnosis not present

## 2017-03-23 DIAGNOSIS — D649 Anemia, unspecified: Secondary | ICD-10-CM | POA: Diagnosis not present

## 2017-03-23 DIAGNOSIS — I10 Essential (primary) hypertension: Secondary | ICD-10-CM | POA: Insufficient documentation

## 2017-03-23 DIAGNOSIS — E669 Obesity, unspecified: Secondary | ICD-10-CM | POA: Diagnosis not present

## 2017-03-23 DIAGNOSIS — K922 Gastrointestinal hemorrhage, unspecified: Secondary | ICD-10-CM | POA: Insufficient documentation

## 2017-03-23 DIAGNOSIS — F419 Anxiety disorder, unspecified: Secondary | ICD-10-CM | POA: Diagnosis not present

## 2017-03-23 MED ORDER — METOPROLOL SUCCINATE ER 25 MG PO TB24: 25.0000 mg | ORAL_TABLET | Freq: Every day | ORAL | 3 refills | Status: DC

## 2017-03-23 NOTE — Patient Instructions (Signed)
Your physician has recommended you make the following change in your medication:  1)Start Metoprolol (Toprol)  once a day at bedtime   Follow up with primary physician - in regards to sleep study

## 2017-03-24 NOTE — Progress Notes (Signed)
Primary Care Physician: Andrea Specking, MD Referring Physician:   [name] is a 76 y.o. female with a h/o persistent afib that is in the afib clinic for evaluation. She has had afib for years and cannot take anticoagulation due to h/o GI bleeding. She was recently seen by Dr. Johney [name] for consideration for Watchman, but was felt that she would not be able to tolerate any anticoagulation. She recently has been poorly rate controlled recently. She states that she snores, and wakes up tired. She can fall asleep easily during the day. She had a sleep study years ago and wears just 02 at night. However, she has gained a lot of weight since that last sleep study. No alcohol or tobacco. She is sedentary and obese.Chadsvasc score is at least 4.  Today, she denies symptoms of palpitations, chest pain, orthopnea, PND, lower extremity edema, dizziness, presyncope, syncope, or neurologic sequela.C/o of fatigue,shortness of breath, daytime somnolence. The patient is tolerating medications without difficulties and is otherwise without complaint today.   Past Medical History:  Diagnosis Date  . Anemia    secondary to acute on chronic GI bleed  . Anxiety disorder   . Diverticulosis   . Exertional dyspnea    Normal LVF, 2-D echo, 09/2012  . Hiatal hernia   . Hypercholesterolemia   . Hypertension   . Obesity   . Osteoarthritis   . Tobacco abuse    Past Surgical History:  Procedure Laterality Date  . ABDOMINAL HYSTERECTOMY    . GIVENS CAPSULE STUDY N/A 02/19/2013   Procedure: GIVENS CAPSULE STUDY;  Surgeon: Andrea Hippo, MD;  [facility];  Service: Endoscopy;  Laterality: N/A;  730    Current Outpatient Prescriptions  Medication Sig Dispense Refill  . amLODipine (NORVASC) 10 MG tablet Take 5 mg by mouth daily.    Marland Kitchen BREO ELLIPTA 100-25 MCG/INH AEPB Inhale 1 puff into the lungs daily.     . clonazePAM (KLONOPIN) 0.5 MG tablet Take 0.5 mg by mouth 2 (two) times daily as needed for  anxiety.     Marland Kitchen diltiazem (DILTIAZEM CD) 240 MG 24 hr capsule Take 1 capsule (240 mg total) by mouth daily. 30 capsule 6  . naproxen sodium (ALEVE) 220 MG tablet Take 220-440 mg by mouth daily as needed. For pain    . OXYGEN Inhale 3 L into the lungs at bedtime.    Marland Kitchen tetrahydrozoline-zinc (VISINE-AC) 0.05-0.25 % ophthalmic solution Place 2 drops into both eyes 3 (three) times daily as needed (Red Itchy Eyes).    . metoprolol succinate (TOPROL XL) 25 MG 24 hr tablet Take 1 tablet (25 mg total) by mouth at bedtime. 30 tablet 3   No current facility-administered medications for this encounter.     Allergies  Allergen Reactions  . Shellfish Allergy Hives, Itching and Swelling    Social History   Social History  . Marital status: Widowed    Spouse name: N/A  . Number of children: N/A  . Years of education: N/A   Occupational History  . Not on file.   Social History Main Topics  . Smoking status: Former Smoker    Packs/day: 0.50    Years: 20.00    Types: Cigarettes    Quit date: 06/22/2012  . Smokeless tobacco: Never Used  . Alcohol use No  . Drug use: No  . Sexual activity: Not on file   Other Topics Concern  . Not on file   Social History Narrative  .  No narrative on file    Family History  Problem Relation Age of Onset  . Hypertension Brother     ROS- All systems are reviewed and negative except as per the HPI above  Physical Exam: Vitals:   03/23/17 0935  BP: (!) 142/92  Pulse: (!) 134  Weight: 235 lb 3.2 oz (106.7 kg)  Height: 5\' 3"  (1.6 m)   Wt Readings from Last 3 Encounters:  03/23/17 235 lb 3.2 oz (106.7 kg)  02/16/17 236 lb (107 kg)  12/20/16 235 lb (106.6 kg)    Labs: Lab Results  Component Value Date   NA 137 08/13/2016   K 3.8 08/13/2016   CL 111 08/13/2016   CO2 25 08/13/2016   GLUCOSE 114 (H) 08/13/2016   BUN 11 08/13/2016   CREATININE 0.80 08/13/2016   CALCIUM 8.2 (L) 08/13/2016   MG 1.9 08/12/2016   No results found for: INR No  results found for: CHOL, HDL, LDLCALC, TRIG   GEN- The patient is well appearing, alert and oriented x 3 today.   Head- normocephalic, atraumatic Eyes-  Sclera clear, conjunctiva pink Ears- hearing intact Oropharynx- clear Neck- supple, no JVP Lymph- no cervical lymphadenopathy Lungs- Clear to ausculation bilaterally, normal work of breathing Heart- Rapid, irregular rate and rhythm, no murmurs, rubs or gallops, PMI not laterally displaced GI- soft, NT, ND, + BS Extremities- no clubbing, cyanosis, or edema MS- no significant deformity or atrophy Skin- no rash or lesion Psych- euthymic mood, full affect Neuro- strength and sensation are intact  EKG-afib with rvr at 134 bpm, qrs int 76 ms, qtc 468 ms    Assessment and Plan: 1. Persistent afib Unfortunately rate control is her goal not being able to take anticoagulation due to previous GI bleeding Deemed not to be a candidate for Watchman due to above Add metoprolol ER 25 mg at hs Continue dilt 240 mg a day  2. Snoring Needs another sleep study due to daytime somnolence and significant weight gain since sleep study done some years ago Sleep apnea may be responsible for a lot of her symptoms and aggravating afib Will try to arrange with PCP since [place] and depends on her brother to drive her to appointments  3.Risk factors Encouraged weight loss Encouraged to be active but she states that she has knee problems   f/u in one month   Andrea [name], ANP-C Af[facility] [address] [place], [place] [address] [phone]

## 2017-03-30 ENCOUNTER — Encounter: Payer: Self-pay | Admitting: Cardiovascular Disease

## 2017-03-30 ENCOUNTER — Ambulatory Visit (INDEPENDENT_AMBULATORY_CARE_PROVIDER_SITE_OTHER): Payer: Medicare Other | Admitting: Cardiovascular Disease

## 2017-03-30 VITALS — BP 149/96 | HR 87 | Ht 63.0 in | Wt 233.0 lb

## 2017-03-30 DIAGNOSIS — R0609 Other forms of dyspnea: Secondary | ICD-10-CM

## 2017-03-30 DIAGNOSIS — I10 Essential (primary) hypertension: Secondary | ICD-10-CM

## 2017-03-30 DIAGNOSIS — I481 Persistent atrial fibrillation: Secondary | ICD-10-CM | POA: Diagnosis not present

## 2017-03-30 DIAGNOSIS — Z9114 Patient's other noncompliance with medication regimen: Secondary | ICD-10-CM

## 2017-03-30 DIAGNOSIS — I4819 Other persistent atrial fibrillation: Secondary | ICD-10-CM

## 2017-03-30 DIAGNOSIS — R002 Palpitations: Secondary | ICD-10-CM

## 2017-03-30 NOTE — Progress Notes (Signed)
SUBJECTIVE: The patient presents for follow-up of persistent atrial fibrillation and hypertension. 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 She cannot take anticoagulation due to a history of GI bleeding. She is not a candidate for a watchman device due to an inability to tolerate any anticoagulation. She was seen in the atrial fibrillation clinic on 03/23/17. Long-acting metoprolol at night was added. She is also on long-acting diltiazem. A repeat sleep study was recommended.  She has been taking Toprol-XL erratically. She noticed her systolic blood pressure was 114 and she thought this was not good so she stopped taking it for at least 2 or 3 nights. She describes a "funny feeling" after taking it. She has not had a sleep study yet. She denies chest pain. She does have exertional dyspnea. She has had significant weight gain.   Review of Systems: As per "subjective", otherwise negative.  Allergies  Allergen Reactions  . Shellfish Allergy Hives, Itching and Swelling    Current Outpatient Prescriptions  Medication Sig Dispense Refill  . amLODipine (NORVASC) 10 MG tablet Take 5 mg by mouth daily.    Marland Kitchen BREO ELLIPTA 100-25 MCG/INH AEPB Inhale 1 puff into the lungs daily.     . clonazePAM (KLONOPIN) 0.5 MG tablet Take 0.5 mg by mouth 2 (two) times daily as needed for anxiety.     Marland Kitchen diltiazem (DILTIAZEM CD) 240 MG 24 hr capsule Take 1 capsule (240 mg total) by mouth daily. 30 capsule 6  . metoprolol succinate (TOPROL XL) 25 MG 24 hr tablet Take 1 tablet (25 mg total) by mouth at bedtime. 30 tablet 3  . naproxen sodium (ALEVE) 220 MG tablet Take 220-440 mg by mouth daily as needed. For pain    . OXYGEN Inhale 3 L into the lungs at bedtime.    Marland Kitchen tetrahydrozoline-zinc (VISINE-AC) 0.05-0.25 % ophthalmic solution Place 2 drops into both eyes 3 (three) times daily as needed (Red Itchy Eyes).     No current facility-administered medications for this visit.     Past Medical History:    Diagnosis Date  . Anemia    secondary to acute on chronic GI bleed  . Anxiety disorder   . Diverticulosis   . Exertional dyspnea    Normal LVF, 2-D echo, 09/2012  . Hiatal hernia   . Hypercholesterolemia   . Hypertension   . Obesity   . Osteoarthritis   . Tobacco abuse     Past Surgical History:  Procedure Laterality Date  . ABDOMINAL HYSTERECTOMY    . GIVENS CAPSULE STUDY N/A 02/19/2013   Procedure: GIVENS CAPSULE STUDY;  Surgeon: Malissa Hippo, MD;  Location: [facility];  Service: Endoscopy;  Laterality: N/A;  730    Social History   Social History  . Marital status: Widowed    Spouse name: N/A  . Number of children: N/A  . Years of education: N/A   Occupational History  . Not on file.   Social History Main Topics  . Smoking status: Former Smoker    Packs/day: 0.50    Years: 20.00    Types: Cigarettes    Quit date: 06/22/2012  . Smokeless tobacco: Never Used  . Alcohol use No  . Drug use: No  . Sexual activity: Not on file   Other Topics Concern  . Not on file   Social History Narrative  . No narrative on file     Vitals:   03/30/17 1109  BP: (!) 149/96  Pulse: 87  Weight: 233 lb (105.7 kg)  Height: 5\' 3"  (1.6 m)    Wt Readings from Last 3 Encounters:  03/30/17 233 lb (105.7 kg)  03/23/17 235 lb 3.2 oz (106.7 kg)  02/16/17 236 lb (107 kg)     PHYSICAL EXAM General: NAD HEENT: Normal. Neck: No JVD, no thyromegaly. Lungs: Diminished throughout, no rales or wheezes. CV: Nondisplaced PMI.  Regular rate and irregular rhythm, normal S1/S2, no S3/S4, no murmur. No pretibial or periankle edema.     Abdomen: Soft, nontender, obese.  Neurologic: Alert and oriented.  Psych: Normal affect. Skin: Normal. Musculoskeletal: No gross deformities.    ECG: Most recent ECG reviewed.   Labs: Lab Results  Component Value Date/Time   K 3.8 08/13/2016 06:39 AM   BUN 11 08/13/2016 06:39 AM   CREATININE 0.80 08/13/2016 06:39 AM   ALT 16 08/12/2016  03:26 PM   TSH 1.303 08/12/2016 03:26 PM   HGB 13.1 08/12/2016 03:26 PM     Lipids: No results found for: LDLCALC, LDLDIRECT, CHOL, TRIG, HDL     ASSESSMENT AND PLAN: 1. Persistent atrial fibrillation: Rate control appears to be her only option. She is not an anticoagulation candidate nor a Watchman device candidate. Continue Toprol-XL 25 mg every evening and long-acting diltiazem 240 mg every morning. I strongly encouraged her to take this as prescribed. She needs a sleep study.  2. Hypertension: Elevated. Needs a sleep study as this is likely contributing to poorly controlled hypertension. She is on amlodipine 10 mg daily.  3. Morbid obesity: Needs a sleep study as sleep apnea may be continuing to both hypertension and atrial fibrillation.  Disposition: Follow up with atrial fibrillation clinic as scheduled. FU with me in 6 months.  Andrea [name], M.D., F.A.C.C.

## 2017-03-30 NOTE — Patient Instructions (Signed)

## 2017-04-01 DIAGNOSIS — J449 Chronic obstructive pulmonary disease, unspecified: Secondary | ICD-10-CM | POA: Diagnosis not present

## 2017-04-01 DIAGNOSIS — M159 Polyosteoarthritis, unspecified: Secondary | ICD-10-CM | POA: Diagnosis not present

## 2017-04-01 DIAGNOSIS — I10 Essential (primary) hypertension: Secondary | ICD-10-CM | POA: Diagnosis not present

## 2017-04-01 DIAGNOSIS — I4891 Unspecified atrial fibrillation: Secondary | ICD-10-CM | POA: Diagnosis not present

## 2017-04-22 ENCOUNTER — Telehealth (HOSPITAL_COMMUNITY): Payer: Self-pay | Admitting: *Deleted

## 2017-04-22 NOTE — Telephone Encounter (Signed)
Patient called in stating she has been rate controlled with addition of metoprolol in the evening HR mainly in 80-90 range. Hasn't had any progress on sleep study will be in touch with her PCP for this. Is canceling appt for next week and will call back once sleep study completed if further changes need to be made.

## 2017-04-26 ENCOUNTER — Inpatient Hospital Stay (HOSPITAL_COMMUNITY): Admission: RE | Admit: 2017-04-26 | Payer: Medicare Other | Source: Ambulatory Visit | Admitting: Nurse Practitioner

## 2017-05-27 DIAGNOSIS — Z299 Encounter for prophylactic measures, unspecified: Secondary | ICD-10-CM | POA: Diagnosis not present

## 2017-05-27 DIAGNOSIS — I272 Pulmonary hypertension, unspecified: Secondary | ICD-10-CM | POA: Diagnosis not present

## 2017-05-27 DIAGNOSIS — I4891 Unspecified atrial fibrillation: Secondary | ICD-10-CM | POA: Diagnosis not present

## 2017-05-27 DIAGNOSIS — G4730 Sleep apnea, unspecified: Secondary | ICD-10-CM | POA: Diagnosis not present

## 2017-05-27 DIAGNOSIS — Z6839 Body mass index (BMI) 39.0-39.9, adult: Secondary | ICD-10-CM | POA: Diagnosis not present

## 2017-05-27 DIAGNOSIS — F419 Anxiety disorder, unspecified: Secondary | ICD-10-CM | POA: Diagnosis not present

## 2017-05-27 DIAGNOSIS — Z713 Dietary counseling and surveillance: Secondary | ICD-10-CM | POA: Diagnosis not present

## 2017-05-27 DIAGNOSIS — I10 Essential (primary) hypertension: Secondary | ICD-10-CM | POA: Diagnosis not present

## 2017-05-27 DIAGNOSIS — J449 Chronic obstructive pulmonary disease, unspecified: Secondary | ICD-10-CM | POA: Diagnosis not present

## 2017-07-13 DIAGNOSIS — I4891 Unspecified atrial fibrillation: Secondary | ICD-10-CM | POA: Diagnosis not present

## 2017-07-13 DIAGNOSIS — G4733 Obstructive sleep apnea (adult) (pediatric): Secondary | ICD-10-CM | POA: Diagnosis not present

## 2017-07-21 DIAGNOSIS — Z0000 Encounter for general adult medical examination without abnormal findings: Secondary | ICD-10-CM | POA: Diagnosis not present

## 2017-07-21 DIAGNOSIS — Z6839 Body mass index (BMI) 39.0-39.9, adult: Secondary | ICD-10-CM | POA: Diagnosis not present

## 2017-07-21 DIAGNOSIS — Z1389 Encounter for screening for other disorder: Secondary | ICD-10-CM | POA: Diagnosis not present

## 2017-07-21 DIAGNOSIS — M1710 Unilateral primary osteoarthritis, unspecified knee: Secondary | ICD-10-CM | POA: Diagnosis not present

## 2017-07-21 DIAGNOSIS — Z299 Encounter for prophylactic measures, unspecified: Secondary | ICD-10-CM | POA: Diagnosis not present

## 2017-07-21 DIAGNOSIS — J449 Chronic obstructive pulmonary disease, unspecified: Secondary | ICD-10-CM | POA: Diagnosis not present

## 2017-07-21 DIAGNOSIS — R5383 Other fatigue: Secondary | ICD-10-CM | POA: Diagnosis not present

## 2017-07-21 DIAGNOSIS — Z7189 Other specified counseling: Secondary | ICD-10-CM | POA: Diagnosis not present

## 2017-07-21 DIAGNOSIS — G4730 Sleep apnea, unspecified: Secondary | ICD-10-CM | POA: Diagnosis not present

## 2017-07-21 DIAGNOSIS — I10 Essential (primary) hypertension: Secondary | ICD-10-CM | POA: Diagnosis not present

## 2017-07-21 DIAGNOSIS — Z79899 Other long term (current) drug therapy: Secondary | ICD-10-CM | POA: Diagnosis not present

## 2017-07-21 DIAGNOSIS — F419 Anxiety disorder, unspecified: Secondary | ICD-10-CM | POA: Diagnosis not present

## 2017-07-21 DIAGNOSIS — I4891 Unspecified atrial fibrillation: Secondary | ICD-10-CM | POA: Diagnosis not present

## 2017-07-27 DIAGNOSIS — D649 Anemia, unspecified: Secondary | ICD-10-CM | POA: Diagnosis not present

## 2017-07-27 DIAGNOSIS — R531 Weakness: Secondary | ICD-10-CM | POA: Diagnosis not present

## 2017-07-28 DIAGNOSIS — R531 Weakness: Secondary | ICD-10-CM | POA: Diagnosis not present

## 2017-07-28 DIAGNOSIS — D649 Anemia, unspecified: Secondary | ICD-10-CM | POA: Diagnosis not present

## 2017-08-04 DIAGNOSIS — E669 Obesity, unspecified: Secondary | ICD-10-CM | POA: Diagnosis present

## 2017-08-04 DIAGNOSIS — J9601 Acute respiratory failure with hypoxia: Secondary | ICD-10-CM | POA: Diagnosis not present

## 2017-08-04 DIAGNOSIS — R0902 Hypoxemia: Secondary | ICD-10-CM | POA: Diagnosis not present

## 2017-08-04 DIAGNOSIS — R0602 Shortness of breath: Secondary | ICD-10-CM | POA: Diagnosis not present

## 2017-08-04 DIAGNOSIS — E279 Disorder of adrenal gland, unspecified: Secondary | ICD-10-CM | POA: Diagnosis not present

## 2017-08-04 DIAGNOSIS — I482 Chronic atrial fibrillation: Secondary | ICD-10-CM | POA: Diagnosis present

## 2017-08-04 DIAGNOSIS — Z91013 Allergy to seafood: Secondary | ICD-10-CM | POA: Diagnosis not present

## 2017-08-04 DIAGNOSIS — Z91041 Radiographic dye allergy status: Secondary | ICD-10-CM | POA: Diagnosis not present

## 2017-08-04 DIAGNOSIS — J441 Chronic obstructive pulmonary disease with (acute) exacerbation: Secondary | ICD-10-CM | POA: Diagnosis not present

## 2017-08-04 DIAGNOSIS — Z87891 Personal history of nicotine dependence: Secondary | ICD-10-CM | POA: Diagnosis not present

## 2017-08-04 DIAGNOSIS — Z79899 Other long term (current) drug therapy: Secondary | ICD-10-CM | POA: Diagnosis not present

## 2017-08-04 DIAGNOSIS — Z6841 Body Mass Index (BMI) 40.0 and over, adult: Secondary | ICD-10-CM | POA: Diagnosis not present

## 2017-08-04 DIAGNOSIS — I5032 Chronic diastolic (congestive) heart failure: Secondary | ICD-10-CM | POA: Diagnosis not present

## 2017-08-04 DIAGNOSIS — Z9981 Dependence on supplemental oxygen: Secondary | ICD-10-CM | POA: Diagnosis not present

## 2017-08-04 DIAGNOSIS — Z881 Allergy status to other antibiotic agents status: Secondary | ICD-10-CM | POA: Diagnosis not present

## 2017-08-04 DIAGNOSIS — R079 Chest pain, unspecified: Secondary | ICD-10-CM | POA: Diagnosis not present

## 2017-08-04 DIAGNOSIS — I110 Hypertensive heart disease with heart failure: Secondary | ICD-10-CM | POA: Diagnosis present

## 2017-08-04 DIAGNOSIS — I272 Pulmonary hypertension, unspecified: Secondary | ICD-10-CM | POA: Diagnosis not present

## 2017-08-04 DIAGNOSIS — F419 Anxiety disorder, unspecified: Secondary | ICD-10-CM | POA: Diagnosis not present

## 2017-08-16 DIAGNOSIS — I272 Pulmonary hypertension, unspecified: Secondary | ICD-10-CM | POA: Diagnosis not present

## 2017-08-16 DIAGNOSIS — J449 Chronic obstructive pulmonary disease, unspecified: Secondary | ICD-10-CM | POA: Diagnosis not present

## 2017-08-16 DIAGNOSIS — F419 Anxiety disorder, unspecified: Secondary | ICD-10-CM | POA: Diagnosis not present

## 2017-08-16 DIAGNOSIS — I10 Essential (primary) hypertension: Secondary | ICD-10-CM | POA: Diagnosis not present

## 2017-08-16 DIAGNOSIS — I4891 Unspecified atrial fibrillation: Secondary | ICD-10-CM | POA: Diagnosis not present

## 2017-08-16 DIAGNOSIS — G4730 Sleep apnea, unspecified: Secondary | ICD-10-CM | POA: Diagnosis not present

## 2017-08-16 DIAGNOSIS — Z6839 Body mass index (BMI) 39.0-39.9, adult: Secondary | ICD-10-CM | POA: Diagnosis not present

## 2017-08-16 DIAGNOSIS — K219 Gastro-esophageal reflux disease without esophagitis: Secondary | ICD-10-CM | POA: Diagnosis not present

## 2017-08-16 DIAGNOSIS — Z299 Encounter for prophylactic measures, unspecified: Secondary | ICD-10-CM | POA: Diagnosis not present

## 2017-08-16 DIAGNOSIS — M1710 Unilateral primary osteoarthritis, unspecified knee: Secondary | ICD-10-CM | POA: Diagnosis not present

## 2017-08-17 DIAGNOSIS — I4891 Unspecified atrial fibrillation: Secondary | ICD-10-CM | POA: Diagnosis not present

## 2017-08-17 DIAGNOSIS — I10 Essential (primary) hypertension: Secondary | ICD-10-CM | POA: Diagnosis not present

## 2017-08-17 DIAGNOSIS — J449 Chronic obstructive pulmonary disease, unspecified: Secondary | ICD-10-CM | POA: Diagnosis not present

## 2017-08-17 DIAGNOSIS — M159 Polyosteoarthritis, unspecified: Secondary | ICD-10-CM | POA: Diagnosis not present

## 2017-09-28 ENCOUNTER — Other Ambulatory Visit: Payer: Self-pay | Admitting: Cardiovascular Disease

## 2017-09-28 NOTE — Telephone Encounter (Signed)
° °  1. Which medications need to be refilled? (please list name of each medication and dose if known)  diltiazem (DILTIAZEM CD) 240 MG    2. Which pharmacy/location (including street and city if local pharmacy) is medication to be sent to? [facility]   [place], [place]   3. Do they need a 30 day or 90 day supply?

## 2017-09-30 IMAGING — CR DG CHEST 1V
1 series · 1 of 1 positions shown · non-contrast
Comparison: 03/01/2014

CLINICAL DATA: Palpitations and exertional dyspnea. Atrial
fibrillation with RVR.

EXAM:
CHEST 1 VIEW

[ap]
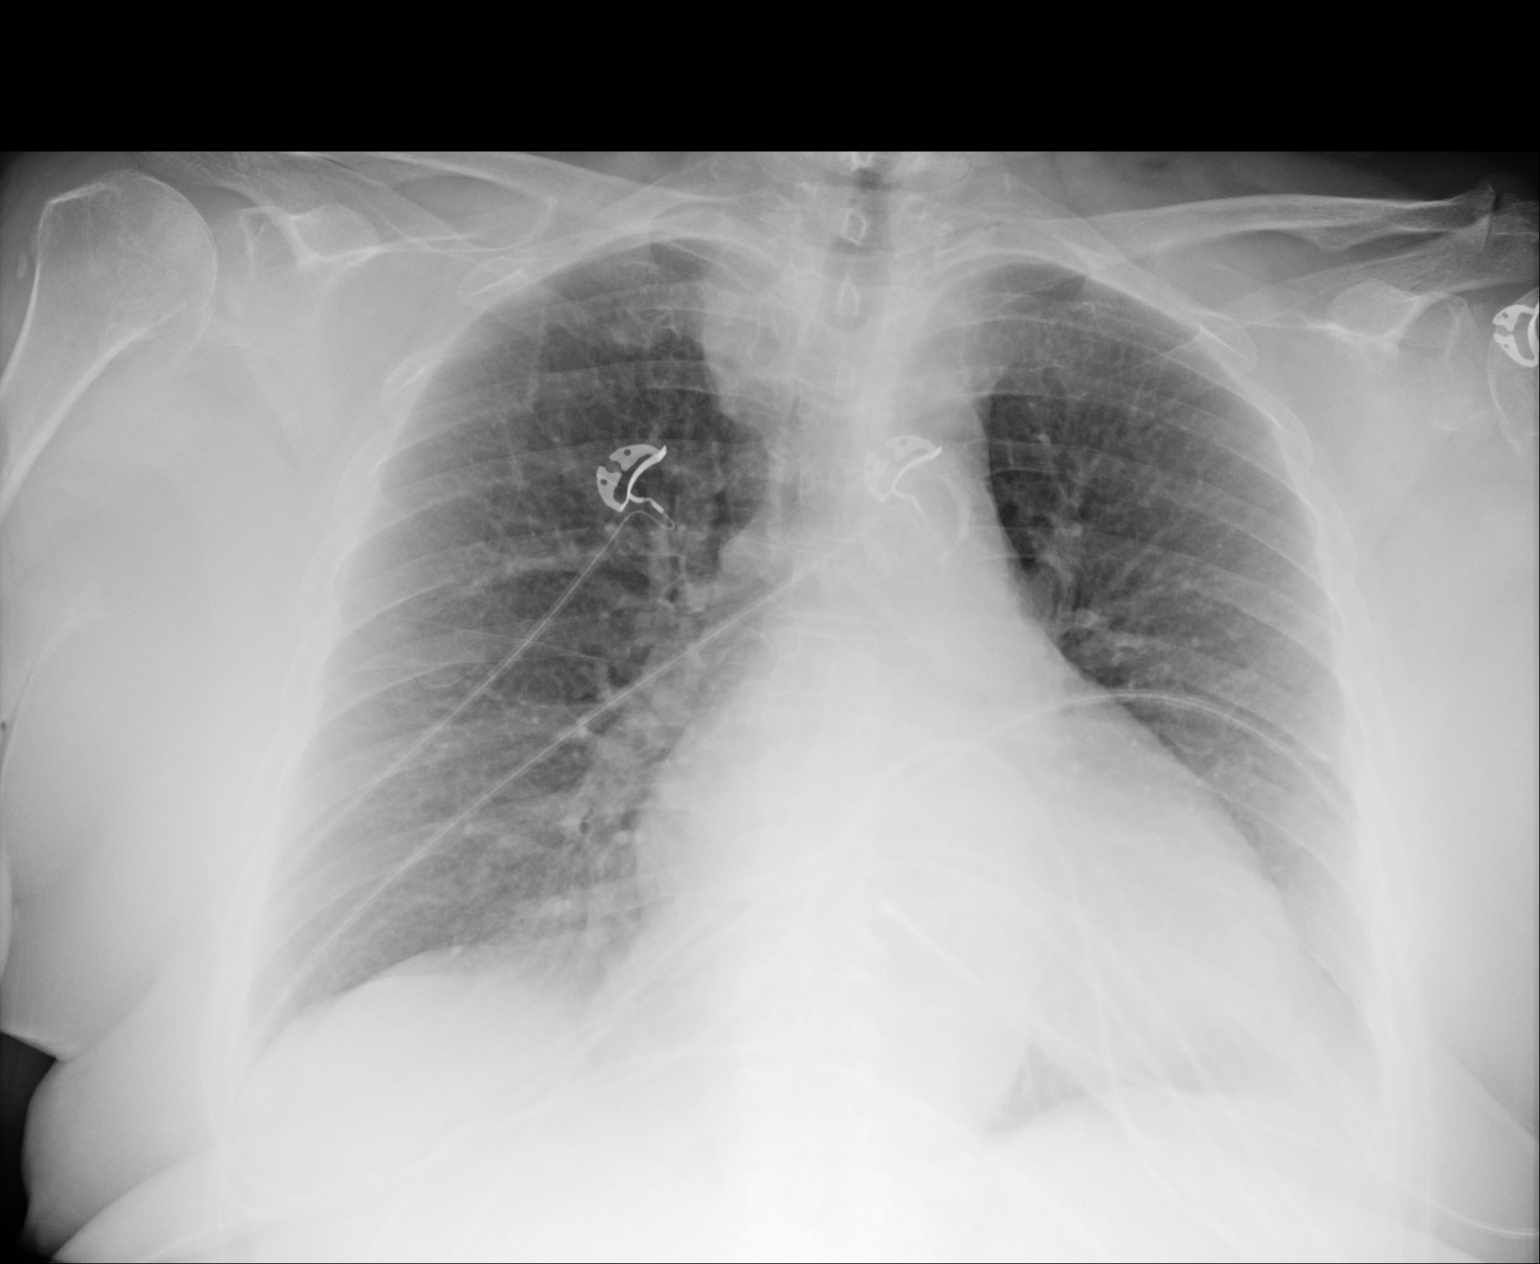

[1 of 1 positions shown; findings below may reference images not displayed]

FINDINGS: Heart size is mildly enlarged but likely accentuated by the
technique and positioning. Lung markings are mildly prominent but
appear stable. No overt pulmonary edema. Stable soft tissue density
in the right paratracheal region. Aortic arch calcifications. No
acute bone abnormality. There is a subtle density between the
anterior first and second ribs that appears to be a chronic finding.
IMPRESSION: No acute chest findings.

Aortic atherosclerosis.

## 2017-10-19 ENCOUNTER — Encounter: Payer: Self-pay | Admitting: Cardiovascular Disease

## 2017-10-19 ENCOUNTER — Ambulatory Visit (INDEPENDENT_AMBULATORY_CARE_PROVIDER_SITE_OTHER): Payer: Medicare Other | Admitting: Cardiovascular Disease

## 2017-10-19 VITALS — BP 152/90 | HR 67 | Ht 62.0 in | Wt 234.0 lb

## 2017-10-19 DIAGNOSIS — G4733 Obstructive sleep apnea (adult) (pediatric): Secondary | ICD-10-CM | POA: Diagnosis not present

## 2017-10-19 DIAGNOSIS — R002 Palpitations: Secondary | ICD-10-CM

## 2017-10-19 DIAGNOSIS — I10 Essential (primary) hypertension: Secondary | ICD-10-CM

## 2017-10-19 DIAGNOSIS — Z8719 Personal history of other diseases of the digestive system: Secondary | ICD-10-CM

## 2017-10-19 DIAGNOSIS — Z9989 Dependence on other enabling machines and devices: Secondary | ICD-10-CM

## 2017-10-19 DIAGNOSIS — I481 Persistent atrial fibrillation: Secondary | ICD-10-CM | POA: Diagnosis not present

## 2017-10-19 DIAGNOSIS — I4819 Other persistent atrial fibrillation: Secondary | ICD-10-CM

## 2017-10-19 MED ORDER — DILTIAZEM HCL ER COATED BEADS 360 MG PO CP24: 360.0000 mg | ORAL_CAPSULE | Freq: Every day | ORAL | 1 refills | Status: DC

## 2017-10-19 NOTE — Patient Instructions (Signed)
Medication Instructions:  Your physician has recommended you make the following change in your medication:    INCREASE cartia to 360 mg daily   Please continue all other medications as prescribed  Labwork:  CBC  Orders given today   Testing/Procedures: NONE  Follow-Up: Your physician wants you to follow-up in: 6 MONTHS WITH DR. Purvis SheffieldKONESWARAN You will receive a reminder letter in the mail two months in advance. If you don't receive a letter, please call our office to schedule the follow-up appointment.  Any Other Special Instructions Will Be Listed Below (If Applicable).  If you need a refill on your cardiac medications before your next appointment, please call your pharmacy.

## 2017-10-19 NOTE — Progress Notes (Signed)
SUBJECTIVE: The patient presents for follow-up of persistent atrial fibrillation and hypertension. 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 She cannot take anticoagulation due to a history of GI bleeding. She is not a candidate for a watchman device due to an inability to tolerate any anticoagulation.  She said "I feel terrible ".  She complains of palpitations.  She tells me she was hospitalized at [facility] within the past 2 months for GI bleeding and received a transfusion.  She tells me she also had a sleep study and is now on CPAP.  Her blood pressure remains elevated today, 152/90.   Review of Systems: As per "subjective", otherwise negative.  Allergies  Allergen Reactions  . Shellfish Allergy Hives, Itching and Swelling    Current Outpatient Medications  Medication Sig Dispense Refill  . amLODipine (NORVASC) 10 MG tablet Take 5 mg by mouth daily.    Marland Kitchen. BREO ELLIPTA 100-25 MCG/INH AEPB Inhale 1 puff into the lungs daily.     Marland Kitchen. CARTIA XT 240 MG 24 hr capsule TAKE ONE CAPSULE BY MOUTH ONCE DAILY 90 capsule 1  . clonazePAM (KLONOPIN) 0.5 MG tablet Take 0.5 mg by mouth 2 (two) times daily as needed for anxiety.     . metoprolol succinate (TOPROL XL) 25 MG 24 hr tablet Take 1 tablet (25 mg total) by mouth at bedtime. 30 tablet 3  . naproxen sodium (ALEVE) 220 MG tablet Take 220-440 mg by mouth daily as needed. For pain    . OXYGEN Inhale 3 L into the lungs at bedtime.    Marland Kitchen. tetrahydrozoline-zinc (VISINE-AC) 0.05-0.25 % ophthalmic solution Place 2 drops into both eyes 3 (three) times daily as needed (Red Itchy Eyes).     No current facility-administered medications for this visit.     Past Medical History:  Diagnosis Date  . Anemia    secondary to acute on chronic GI bleed  . Anxiety disorder   . Diverticulosis   . Exertional dyspnea    Normal LVF, 2-D echo, 09/2012  . Hiatal hernia   . Hypercholesterolemia   . Hypertension   . Obesity   . Osteoarthritis    . Tobacco abuse     Past Surgical History:  Procedure Laterality Date  . ABDOMINAL HYSTERECTOMY    . GIVENS CAPSULE STUDY N/A 02/19/2013   Procedure: GIVENS CAPSULE STUDY;  Surgeon: Malissa HippoNajeeb U Rehman, MD;  Location: [facility];  Service: Endoscopy;  Laterality: N/A;  730    Social History   Socioeconomic History  . Marital status: Widowed    Spouse name: Not on file  . Number of children: Not on file  . Years of education: Not on file  . Highest education level: Not on file  Social Needs  . Financial resource strain: Not on file  . Food insecurity - worry: Not on file  . Food insecurity - inability: Not on file  . Transportation needs - medical: Not on file  . Transportation needs - non-medical: Not on file  Occupational History  . Not on file  Tobacco Use  . Smoking status: Former Smoker    Packs/day: 0.50    Years: 20.00    Pack years: 10.00    Types: Cigarettes    Last attempt to quit: 06/22/2012    Years since quitting: 5.3  . Smokeless tobacco: Never Used  Substance and Sexual Activity  . Alcohol use: No  . Drug use: No  . Sexual activity:  Not on file  Other Topics Concern  . Not on file  Social History Narrative  . Not on file     Vitals:   10/19/17 1144  BP: (!) 152/90  Pulse: 67  SpO2: 98%  Weight: 234 lb (106.1 kg)  Height: 5\' 2"  (1.575 m)    Wt Readings from Last 3 Encounters:  10/19/17 234 lb (106.1 kg)  03/30/17 233 lb (105.7 kg)  03/23/17 235 lb 3.2 oz (106.7 kg)     PHYSICAL EXAM General: NAD HEENT: Normal. Neck: No JVD, no thyromegaly. Lungs: Clear to auscultation bilaterally with normal respiratory effort. CV: Heart rate at upper normal limits, irregular rhythm, normal S1/S2, no S3, no murmur. No pretibial or periankle edema.    Abdomen: Soft, nontender, no distention.  Neurologic: Alert and oriented.  Psych: Normal affect. Skin: Normal. Musculoskeletal: No gross deformities.    ECG: Most recent ECG reviewed.   Labs: Lab  Results  Component Value Date/Time   K 3.8 08/13/2016 06:39 AM   BUN 11 08/13/2016 06:39 AM   CREATININE 0.80 08/13/2016 06:39 AM   ALT 16 08/12/2016 03:26 PM   TSH 1.303 08/12/2016 03:26 PM   HGB 13.1 08/12/2016 03:26 PM     Lipids: No results found for: LDLCALC, LDLDIRECT, CHOL, TRIG, HDL     ASSESSMENT AND PLAN: 1. Persistent atrial fibrillation: Rate control appears to be her only option and it is presently suboptimally controlled. She is not an anticoagulation candidate nor a Watchman device candidate. Continue Toprol-XL 25 mg every evening and I will increase long-acting diltiazem to 360 mg every morning.  2. Hypertension: Elevated. I will increase long-acting diltiazem to 360 mg every morning.  3. Sleep apnea: Currently on CPAP.  4.  GI bleeding with history of transfusion: [facility].  I will check a CBC to monitor hemoglobin.   Disposition: Follow up 6 months   Prentice DockerSuresh Eliah Ozawa, M.D., F.A.C.C.

## 2017-10-24 DIAGNOSIS — J449 Chronic obstructive pulmonary disease, unspecified: Secondary | ICD-10-CM | POA: Diagnosis not present

## 2017-10-24 DIAGNOSIS — I272 Pulmonary hypertension, unspecified: Secondary | ICD-10-CM | POA: Diagnosis not present

## 2017-10-24 DIAGNOSIS — Z299 Encounter for prophylactic measures, unspecified: Secondary | ICD-10-CM | POA: Diagnosis not present

## 2017-10-24 DIAGNOSIS — F419 Anxiety disorder, unspecified: Secondary | ICD-10-CM | POA: Diagnosis not present

## 2017-10-24 DIAGNOSIS — D649 Anemia, unspecified: Secondary | ICD-10-CM | POA: Diagnosis not present

## 2017-10-24 DIAGNOSIS — I4891 Unspecified atrial fibrillation: Secondary | ICD-10-CM | POA: Diagnosis not present

## 2017-10-24 DIAGNOSIS — G4730 Sleep apnea, unspecified: Secondary | ICD-10-CM | POA: Diagnosis not present

## 2017-10-24 DIAGNOSIS — Z6838 Body mass index (BMI) 38.0-38.9, adult: Secondary | ICD-10-CM | POA: Diagnosis not present

## 2017-11-30 DIAGNOSIS — D649 Anemia, unspecified: Secondary | ICD-10-CM | POA: Diagnosis not present

## 2017-12-06 DIAGNOSIS — D649 Anemia, unspecified: Secondary | ICD-10-CM | POA: Diagnosis not present

## 2017-12-06 DIAGNOSIS — R531 Weakness: Secondary | ICD-10-CM | POA: Diagnosis not present

## 2017-12-08 DIAGNOSIS — D649 Anemia, unspecified: Secondary | ICD-10-CM | POA: Diagnosis not present

## 2017-12-08 DIAGNOSIS — R531 Weakness: Secondary | ICD-10-CM | POA: Diagnosis not present

## 2018-02-10 DIAGNOSIS — Z299 Encounter for prophylactic measures, unspecified: Secondary | ICD-10-CM | POA: Diagnosis not present

## 2018-02-10 DIAGNOSIS — D649 Anemia, unspecified: Secondary | ICD-10-CM | POA: Diagnosis not present

## 2018-02-10 DIAGNOSIS — I272 Pulmonary hypertension, unspecified: Secondary | ICD-10-CM | POA: Diagnosis not present

## 2018-02-10 DIAGNOSIS — F419 Anxiety disorder, unspecified: Secondary | ICD-10-CM | POA: Diagnosis not present

## 2018-02-10 DIAGNOSIS — G4730 Sleep apnea, unspecified: Secondary | ICD-10-CM | POA: Diagnosis not present

## 2018-02-10 DIAGNOSIS — I4891 Unspecified atrial fibrillation: Secondary | ICD-10-CM | POA: Diagnosis not present

## 2018-02-10 DIAGNOSIS — Z6837 Body mass index (BMI) 37.0-37.9, adult: Secondary | ICD-10-CM | POA: Diagnosis not present

## 2018-02-10 DIAGNOSIS — I270 Primary pulmonary hypertension: Secondary | ICD-10-CM | POA: Diagnosis not present

## 2018-02-10 DIAGNOSIS — J449 Chronic obstructive pulmonary disease, unspecified: Secondary | ICD-10-CM | POA: Diagnosis not present

## 2018-02-12 DIAGNOSIS — T1490XA Injury, unspecified, initial encounter: Secondary | ICD-10-CM | POA: Diagnosis not present

## 2018-02-12 DIAGNOSIS — J9611 Chronic respiratory failure with hypoxia: Secondary | ICD-10-CM | POA: Diagnosis not present

## 2018-02-12 DIAGNOSIS — Z9981 Dependence on supplemental oxygen: Secondary | ICD-10-CM | POA: Diagnosis not present

## 2018-02-12 DIAGNOSIS — Z882 Allergy status to sulfonamides status: Secondary | ICD-10-CM | POA: Diagnosis not present

## 2018-02-12 DIAGNOSIS — R0602 Shortness of breath: Secondary | ICD-10-CM | POA: Diagnosis not present

## 2018-02-12 DIAGNOSIS — I998 Other disorder of circulatory system: Secondary | ICD-10-CM | POA: Diagnosis not present

## 2018-02-12 DIAGNOSIS — G4733 Obstructive sleep apnea (adult) (pediatric): Secondary | ICD-10-CM | POA: Diagnosis not present

## 2018-02-12 DIAGNOSIS — I10 Essential (primary) hypertension: Secondary | ICD-10-CM | POA: Diagnosis not present

## 2018-02-12 DIAGNOSIS — D509 Iron deficiency anemia, unspecified: Secondary | ICD-10-CM | POA: Diagnosis not present

## 2018-02-12 DIAGNOSIS — K449 Diaphragmatic hernia without obstruction or gangrene: Secondary | ICD-10-CM | POA: Diagnosis not present

## 2018-02-12 DIAGNOSIS — R4182 Altered mental status, unspecified: Secondary | ICD-10-CM | POA: Diagnosis not present

## 2018-02-12 DIAGNOSIS — I482 Chronic atrial fibrillation: Secondary | ICD-10-CM | POA: Diagnosis not present

## 2018-02-12 DIAGNOSIS — J449 Chronic obstructive pulmonary disease, unspecified: Secondary | ICD-10-CM | POA: Diagnosis not present

## 2018-02-12 DIAGNOSIS — R4701 Aphasia: Secondary | ICD-10-CM | POA: Diagnosis not present

## 2018-02-12 DIAGNOSIS — F419 Anxiety disorder, unspecified: Secondary | ICD-10-CM | POA: Diagnosis not present

## 2018-02-12 DIAGNOSIS — I160 Hypertensive urgency: Secondary | ICD-10-CM | POA: Diagnosis not present

## 2018-02-12 DIAGNOSIS — R079 Chest pain, unspecified: Secondary | ICD-10-CM | POA: Diagnosis not present

## 2018-02-12 DIAGNOSIS — R918 Other nonspecific abnormal finding of lung field: Secondary | ICD-10-CM | POA: Diagnosis not present

## 2018-02-12 DIAGNOSIS — R9082 White matter disease, unspecified: Secondary | ICD-10-CM | POA: Diagnosis not present

## 2018-02-12 DIAGNOSIS — R413 Other amnesia: Secondary | ICD-10-CM | POA: Diagnosis not present

## 2018-02-12 DIAGNOSIS — Z79899 Other long term (current) drug therapy: Secondary | ICD-10-CM | POA: Diagnosis not present

## 2018-02-12 DIAGNOSIS — Z888 Allergy status to other drugs, medicaments and biological substances status: Secondary | ICD-10-CM | POA: Diagnosis not present

## 2018-02-12 DIAGNOSIS — K922 Gastrointestinal hemorrhage, unspecified: Secondary | ICD-10-CM | POA: Diagnosis not present

## 2018-02-12 DIAGNOSIS — F410 Panic disorder [episodic paroxysmal anxiety] without agoraphobia: Secondary | ICD-10-CM | POA: Diagnosis not present

## 2018-02-12 DIAGNOSIS — I517 Cardiomegaly: Secondary | ICD-10-CM | POA: Diagnosis not present

## 2018-02-12 DIAGNOSIS — N390 Urinary tract infection, site not specified: Secondary | ICD-10-CM | POA: Diagnosis not present

## 2018-02-13 DIAGNOSIS — I4891 Unspecified atrial fibrillation: Secondary | ICD-10-CM | POA: Diagnosis not present

## 2018-02-13 DIAGNOSIS — R0609 Other forms of dyspnea: Secondary | ICD-10-CM | POA: Diagnosis not present

## 2018-02-13 DIAGNOSIS — R4182 Altered mental status, unspecified: Secondary | ICD-10-CM | POA: Diagnosis not present

## 2018-02-13 DIAGNOSIS — R413 Other amnesia: Secondary | ICD-10-CM | POA: Diagnosis not present

## 2018-02-13 DIAGNOSIS — I10 Essential (primary) hypertension: Secondary | ICD-10-CM | POA: Diagnosis not present

## 2018-02-13 DIAGNOSIS — R4701 Aphasia: Secondary | ICD-10-CM | POA: Diagnosis not present

## 2018-02-13 DIAGNOSIS — R29818 Other symptoms and signs involving the nervous system: Secondary | ICD-10-CM | POA: Diagnosis not present

## 2018-02-14 DIAGNOSIS — R4182 Altered mental status, unspecified: Secondary | ICD-10-CM | POA: Diagnosis not present

## 2018-02-14 DIAGNOSIS — Z01810 Encounter for preprocedural cardiovascular examination: Secondary | ICD-10-CM | POA: Diagnosis not present

## 2018-02-20 DIAGNOSIS — I272 Pulmonary hypertension, unspecified: Secondary | ICD-10-CM | POA: Diagnosis not present

## 2018-02-20 DIAGNOSIS — Z09 Encounter for follow-up examination after completed treatment for conditions other than malignant neoplasm: Secondary | ICD-10-CM | POA: Diagnosis not present

## 2018-02-20 DIAGNOSIS — N390 Urinary tract infection, site not specified: Secondary | ICD-10-CM | POA: Diagnosis not present

## 2018-02-20 DIAGNOSIS — I4891 Unspecified atrial fibrillation: Secondary | ICD-10-CM | POA: Diagnosis not present

## 2018-02-20 DIAGNOSIS — J449 Chronic obstructive pulmonary disease, unspecified: Secondary | ICD-10-CM | POA: Diagnosis not present

## 2018-02-20 DIAGNOSIS — Z6837 Body mass index (BMI) 37.0-37.9, adult: Secondary | ICD-10-CM | POA: Diagnosis not present

## 2018-04-18 DIAGNOSIS — M159 Polyosteoarthritis, unspecified: Secondary | ICD-10-CM | POA: Diagnosis not present

## 2018-04-18 DIAGNOSIS — J449 Chronic obstructive pulmonary disease, unspecified: Secondary | ICD-10-CM | POA: Diagnosis not present

## 2018-04-18 DIAGNOSIS — I4891 Unspecified atrial fibrillation: Secondary | ICD-10-CM | POA: Diagnosis not present

## 2018-04-18 DIAGNOSIS — I10 Essential (primary) hypertension: Secondary | ICD-10-CM | POA: Diagnosis not present

## 2018-04-27 ENCOUNTER — Ambulatory Visit: Payer: Medicare Other | Admitting: Cardiovascular Disease

## 2018-05-16 DIAGNOSIS — J449 Chronic obstructive pulmonary disease, unspecified: Secondary | ICD-10-CM | POA: Diagnosis not present

## 2018-05-16 DIAGNOSIS — D649 Anemia, unspecified: Secondary | ICD-10-CM | POA: Diagnosis not present

## 2018-05-16 DIAGNOSIS — I4891 Unspecified atrial fibrillation: Secondary | ICD-10-CM | POA: Diagnosis not present

## 2018-05-16 DIAGNOSIS — Z6837 Body mass index (BMI) 37.0-37.9, adult: Secondary | ICD-10-CM | POA: Diagnosis not present

## 2018-05-16 DIAGNOSIS — I272 Pulmonary hypertension, unspecified: Secondary | ICD-10-CM | POA: Diagnosis not present

## 2018-05-16 DIAGNOSIS — Z299 Encounter for prophylactic measures, unspecified: Secondary | ICD-10-CM | POA: Diagnosis not present

## 2018-05-18 DIAGNOSIS — I4891 Unspecified atrial fibrillation: Secondary | ICD-10-CM | POA: Diagnosis not present

## 2018-05-18 DIAGNOSIS — I10 Essential (primary) hypertension: Secondary | ICD-10-CM | POA: Diagnosis not present

## 2018-05-18 DIAGNOSIS — J449 Chronic obstructive pulmonary disease, unspecified: Secondary | ICD-10-CM | POA: Diagnosis not present

## 2018-05-18 DIAGNOSIS — M159 Polyosteoarthritis, unspecified: Secondary | ICD-10-CM | POA: Diagnosis not present

## 2018-07-17 ENCOUNTER — Encounter: Payer: Self-pay | Admitting: Cardiovascular Disease

## 2018-07-17 ENCOUNTER — Ambulatory Visit (INDEPENDENT_AMBULATORY_CARE_PROVIDER_SITE_OTHER): Payer: Medicare Other | Admitting: Cardiovascular Disease

## 2018-07-17 ENCOUNTER — Encounter

## 2018-07-17 VITALS — BP 130/82 | HR 115 | Ht 62.0 in | Wt 220.0 lb

## 2018-07-17 DIAGNOSIS — I4891 Unspecified atrial fibrillation: Secondary | ICD-10-CM | POA: Diagnosis not present

## 2018-07-17 DIAGNOSIS — I482 Chronic atrial fibrillation: Secondary | ICD-10-CM | POA: Diagnosis not present

## 2018-07-17 DIAGNOSIS — R0609 Other forms of dyspnea: Secondary | ICD-10-CM | POA: Diagnosis not present

## 2018-07-17 DIAGNOSIS — R002 Palpitations: Secondary | ICD-10-CM | POA: Diagnosis not present

## 2018-07-17 DIAGNOSIS — G4733 Obstructive sleep apnea (adult) (pediatric): Secondary | ICD-10-CM | POA: Diagnosis not present

## 2018-07-17 DIAGNOSIS — I10 Essential (primary) hypertension: Secondary | ICD-10-CM

## 2018-07-17 DIAGNOSIS — Z9989 Dependence on other enabling machines and devices: Secondary | ICD-10-CM

## 2018-07-17 DIAGNOSIS — Z8719 Personal history of other diseases of the digestive system: Secondary | ICD-10-CM | POA: Diagnosis not present

## 2018-07-17 DIAGNOSIS — I4821 Permanent atrial fibrillation: Secondary | ICD-10-CM

## 2018-07-17 MED ORDER — DILTIAZEM HCL ER COATED BEADS 360 MG PO CP24: 360.0000 mg | ORAL_CAPSULE | Freq: Every day | ORAL | 3 refills | Status: DC

## 2018-07-17 MED ORDER — METOPROLOL SUCCINATE ER 25 MG PO TB24: 25.0000 mg | ORAL_TABLET | Freq: Every day | ORAL | 3 refills | Status: DC

## 2018-07-17 MED ORDER — METOPROLOL SUCCINATE ER 50 MG PO TB24: 50.0000 mg | ORAL_TABLET | ORAL | 3 refills | Status: DC

## 2018-07-17 NOTE — Progress Notes (Signed)
SUBJECTIVE: The patient presents for follow-up of permanent atrial fibrillation and hypertension. 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 She cannot take anticoagulation due to a history of GI bleeding. She is not a candidate for a watchman device due to an inability to tolerate any anticoagulation.  ECG performed in the office today which I ordered and personally interpreted demonstrates rapid atrial fibrillation, 115 bpm.  She gets short of breath with minimal exertion.  She denies chest pain.  She seldom has chest pressure alleviated with belching.  She sometimes has anxiety and wakes up at 3 or 4 in the morning but takes clonazepam and feels better.  When she feels well she sometimes does not take her cardiac medications.  She is sometimes unable to perform activities of daily living due to exertional dyspnea and palpitations.  She is widowed and her godson lives with her.   Review of Systems: As per "subjective", otherwise negative.  Allergies  Allergen Reactions  . Shellfish Allergy Hives, Itching and Swelling    Current Outpatient Medications  Medication Sig Dispense Refill  . BREO ELLIPTA 100-25 MCG/INH AEPB Inhale 1 puff into the lungs daily.     . clonazePAM (KLONOPIN) 0.5 MG tablet Take 0.5 mg by mouth 2 (two) times daily as needed for anxiety.     Marland Kitchen diltiazem (CARDIZEM CD) 360 MG 24 hr capsule Take 1 capsule (360 mg total) by mouth daily. 90 capsule 1  . metoprolol succinate (TOPROL XL) 25 MG 24 hr tablet Take 1 tablet (25 mg total) by mouth at bedtime. 30 tablet 3  . naproxen sodium (ALEVE) 220 MG tablet Take 220-440 mg by mouth daily as needed. For pain    . OXYGEN Inhale 3 L into the lungs at bedtime.    Marland Kitchen tetrahydrozoline-zinc (VISINE-AC) 0.05-0.25 % ophthalmic solution Place 2 drops into both eyes 3 (three) times daily as needed (Red Itchy Eyes).     No current facility-administered medications for this visit.     Past Medical History:  Diagnosis  Date  . Anemia    secondary to acute on chronic GI bleed  . Anxiety disorder   . Diverticulosis   . Exertional dyspnea    Normal LVF, 2-D echo, 09/2012  . Hiatal hernia   . Hypercholesterolemia   . Hypertension   . Obesity   . Osteoarthritis   . Tobacco abuse     Past Surgical History:  Procedure Laterality Date  . ABDOMINAL HYSTERECTOMY    . GIVENS CAPSULE STUDY N/A 02/19/2013   Procedure: GIVENS CAPSULE STUDY;  Surgeon: Malissa Hippo, MD;  Location: [facility];  Service: Endoscopy;  Laterality: N/A;  730    Social History   Socioeconomic History  . Marital status: Widowed    Spouse name: Not on file  . Number of children: Not on file  . Years of education: Not on file  . Highest education level: Not on file  Occupational History  . Not on file  Social Needs  . Financial resource strain: Not on file  . Food insecurity:    Worry: Not on file    Inability: Not on file  . Transportation needs:    Medical: Not on file    Non-medical: Not on file  Tobacco Use  . Smoking status: Former Smoker    Packs/day: 0.50    Years: 20.00    Pack years: 10.00    Types: Cigarettes    Last attempt to  quit: 06/22/2012    Years since quitting: 6.0  . Smokeless tobacco: Never Used  Substance and Sexual Activity  . Alcohol use: No  . Drug use: No  . Sexual activity: Not on file  Lifestyle  . Physical activity:    Days per week: Not on file    Minutes per session: Not on file  . Stress: Not on file  Relationships  . Social connections:    Talks on phone: Not on file    Gets together: Not on file    Attends religious service: Not on file    Active member of club or organization: Not on file    Attends meetings of clubs or organizations: Not on file    Relationship status: Not on file  . Intimate partner violence:    Fear of current or ex partner: Not on file    Emotionally abused: Not on file    Physically abused: Not on file    Forced sexual activity: Not on file    Other Topics Concern  . Not on file  Social History Narrative  . Not on file     Vitals:   07/17/18 1139  BP: 130/82  Pulse: 94  SpO2: 97%  Weight: 220 lb (99.8 kg)  Height: 5\' 2"  (1.575 m)    Wt Readings from Last 3 Encounters:  07/17/18 220 lb (99.8 kg)  10/19/17 234 lb (106.1 kg)  03/30/17 233 lb (105.7 kg)     PHYSICAL EXAM General: NAD HEENT: Normal. Neck: No JVD, no thyromegaly. Lungs: Clear to auscultation bilaterally with normal respiratory effort. CV: Tachycardic, irregular rhythm, normal S1/S2, no S3, no murmur. No pretibial or periankle edema.    Abdomen: Soft, nontender, no distention.  Neurologic: Alert and oriented.  Psych: Normal affect. Skin: Normal. Musculoskeletal: No gross deformities.    ECG: Reviewed above under Subjective   Labs: Lab Results  Component Value Date/Time   K 3.8 08/13/2016 06:39 AM   BUN 11 08/13/2016 06:39 AM   CREATININE 0.80 08/13/2016 06:39 AM   ALT 16 08/12/2016 03:26 PM   TSH 1.303 08/12/2016 03:26 PM   HGB 13.1 08/12/2016 03:26 PM     Lipids: No results found for: LDLCALC, LDLDIRECT, CHOL, TRIG, HDL     ASSESSMENT AND PLAN:  1.  Rapid permanent atrial fibrillation with exertional dyspnea and palpitations: Rate control appears to be her only option and it is presently suboptimally controlled. She is not an anticoagulation candidate nor aWatchman device candidate.  Continue long-acting diltiazem to 360 mg every morning.  I will increase Toprol-XL to 50 mg every morning and continue 25 mg every evening.  2. Hypertension: Controlled.  I will monitor given increase of Toprol-XL as noted above.  3. Sleep apnea: Currently on CPAP.  4.  GI bleeding with history of transfusion: Stable.  No recent hospitalizations.   Disposition: Follow up 6 months   Prentice DockerSuresh Etienne Mowers, M.D., F.A.C.C.

## 2018-07-17 NOTE — Patient Instructions (Signed)
Medication Instructions:   Begin Toprol XL 50mg  every morning.   Continue the Toprol XL 25mg  in the evening.   Continue all other medications.    Labwork: none  Testing/Procedures: none  Follow-Up: Your physician wants you to follow up in: 6 months.  You will receive a reminder letter in the mail one-two months in advance.  If you don't receive a letter, please call our office to schedule the follow up appointment   Any Other Special Instructions Will Be Listed Below (If Applicable).  If you need a refill on your cardiac medications before your next appointment, please call your pharmacy.

## 2018-07-27 ENCOUNTER — Telehealth: Payer: Self-pay | Admitting: Cardiovascular Disease

## 2018-07-27 NOTE — Telephone Encounter (Signed)
Calling about prescribe that was to be sent to [facility] mail service   metoprolol succinate (TOPROL-XL) 50 MG 24 hr tablet

## 2018-07-27 NOTE — Telephone Encounter (Signed)
Patient informed that scripts was sent in on 07/17/2018.  She stated that she had not heard from them yet.  Suggested she contact them to follow up, especially since she has never used them before.  Labor Day holiday may have slowed things down to.

## 2018-08-22 DIAGNOSIS — Z0000 Encounter for general adult medical examination without abnormal findings: Secondary | ICD-10-CM | POA: Diagnosis not present

## 2018-08-22 DIAGNOSIS — Z7189 Other specified counseling: Secondary | ICD-10-CM | POA: Diagnosis not present

## 2018-08-22 DIAGNOSIS — Z299 Encounter for prophylactic measures, unspecified: Secondary | ICD-10-CM | POA: Diagnosis not present

## 2018-08-22 DIAGNOSIS — I10 Essential (primary) hypertension: Secondary | ICD-10-CM | POA: Diagnosis not present

## 2018-08-22 DIAGNOSIS — Z79899 Other long term (current) drug therapy: Secondary | ICD-10-CM | POA: Diagnosis not present

## 2018-08-22 DIAGNOSIS — D649 Anemia, unspecified: Secondary | ICD-10-CM | POA: Diagnosis not present

## 2018-08-22 DIAGNOSIS — F419 Anxiety disorder, unspecified: Secondary | ICD-10-CM | POA: Diagnosis not present

## 2018-08-22 DIAGNOSIS — Z1211 Encounter for screening for malignant neoplasm of colon: Secondary | ICD-10-CM | POA: Diagnosis not present

## 2018-08-22 DIAGNOSIS — Z1339 Encounter for screening examination for other mental health and behavioral disorders: Secondary | ICD-10-CM | POA: Diagnosis not present

## 2018-08-22 DIAGNOSIS — Z1331 Encounter for screening for depression: Secondary | ICD-10-CM | POA: Diagnosis not present

## 2018-08-22 DIAGNOSIS — Z6837 Body mass index (BMI) 37.0-37.9, adult: Secondary | ICD-10-CM | POA: Diagnosis not present

## 2018-09-13 ENCOUNTER — Telehealth: Payer: Self-pay | Admitting: Cardiovascular Disease

## 2018-09-13 NOTE — Telephone Encounter (Signed)
Patient reports that 2 days ago, c/o both knees down to feet are swollen. Said her legs feel a little tight. Reports having increased sob with activity. Per patient, her baseline weight is usually around 225 lbs and weight while on the phone with patient is 230 lbs. Patient said she does not weigh daily. Patient also c/o " my heart feels like its beating fast, I'm feeling palpitations and feels like something is in my throat'. Patient is unable to check BP and HR at home. No c/o chest pain or dizziness. Medications reconciled. Denies missed doses of medications. Appointment scheduled in the A-fib clinic for 09/14/18 @11 :00 am. Patient aware of garage code and location. Advised patient if her symptoms get worse, to go to the ED for an evaluation.  Verbalized understanding of plan.

## 2018-09-13 NOTE — Telephone Encounter (Signed)
Patient called stating that for the past 2 days she continues to have swelling in her legs,ankles and feet. She is having shortness of breath.

## 2018-09-14 ENCOUNTER — Ambulatory Visit (HOSPITAL_COMMUNITY)
Admission: RE | Admit: 2018-09-14 | Discharge: 2018-09-14 | Disposition: A | Discharge status: Home / Self Care | Payer: Medicare Other | Source: Ambulatory Visit | Attending: Nurse Practitioner | Admitting: Nurse Practitioner

## 2018-09-14 ENCOUNTER — Encounter (HOSPITAL_COMMUNITY): Payer: Self-pay | Admitting: Nurse Practitioner

## 2018-09-14 VITALS — BP 136/74 | HR 85 | Ht 62.0 in | Wt 229.0 lb

## 2018-09-14 DIAGNOSIS — I10 Essential (primary) hypertension: Secondary | ICD-10-CM | POA: Diagnosis not present

## 2018-09-14 DIAGNOSIS — I700 Atherosclerosis of aorta: Secondary | ICD-10-CM

## 2018-09-14 DIAGNOSIS — Z9981 Dependence on supplemental oxygen: Secondary | ICD-10-CM | POA: Insufficient documentation

## 2018-09-14 DIAGNOSIS — G4733 Obstructive sleep apnea (adult) (pediatric): Secondary | ICD-10-CM | POA: Insufficient documentation

## 2018-09-14 DIAGNOSIS — Z91013 Allergy to seafood: Secondary | ICD-10-CM | POA: Insufficient documentation

## 2018-09-14 DIAGNOSIS — Z6841 Body Mass Index (BMI) 40.0 and over, adult: Secondary | ICD-10-CM | POA: Insufficient documentation

## 2018-09-14 DIAGNOSIS — R0600 Dyspnea, unspecified: Secondary | ICD-10-CM

## 2018-09-14 DIAGNOSIS — M1990 Unspecified osteoarthritis, unspecified site: Secondary | ICD-10-CM | POA: Diagnosis not present

## 2018-09-14 DIAGNOSIS — R0602 Shortness of breath: Secondary | ICD-10-CM | POA: Diagnosis not present

## 2018-09-14 DIAGNOSIS — E7800 Pure hypercholesterolemia, unspecified: Secondary | ICD-10-CM | POA: Insufficient documentation

## 2018-09-14 DIAGNOSIS — I119 Hypertensive heart disease without heart failure: Secondary | ICD-10-CM | POA: Insufficient documentation

## 2018-09-14 DIAGNOSIS — Z87891 Personal history of nicotine dependence: Secondary | ICD-10-CM | POA: Insufficient documentation

## 2018-09-14 DIAGNOSIS — I4821 Permanent atrial fibrillation: Secondary | ICD-10-CM | POA: Diagnosis not present

## 2018-09-14 DIAGNOSIS — E669 Obesity, unspecified: Secondary | ICD-10-CM | POA: Diagnosis not present

## 2018-09-14 DIAGNOSIS — Z8249 Family history of ischemic heart disease and other diseases of the circulatory system: Secondary | ICD-10-CM | POA: Insufficient documentation

## 2018-09-14 DIAGNOSIS — Z79899 Other long term (current) drug therapy: Secondary | ICD-10-CM | POA: Insufficient documentation

## 2018-09-14 DIAGNOSIS — F419 Anxiety disorder, unspecified: Secondary | ICD-10-CM | POA: Insufficient documentation

## 2018-09-14 LAB — COMPREHENSIVE METABOLIC PANEL
ALBUMIN: 3.6 g/dL (ref 3.5–5.0)
ALK PHOS: 134 U/L — AB (ref 38–126)
ALT: 9 U/L (ref 0–44)
AST: 22 U/L (ref 15–41)
Anion gap: 9 (ref 5–15)
BILIRUBIN TOTAL: 0.9 mg/dL (ref 0.3–1.2)
BUN: 14 mg/dL (ref 8–23)
CO2: 25 mmol/L (ref 22–32)
Calcium: 9 mg/dL (ref 8.9–10.3)
Chloride: 107 mmol/L (ref 98–111)
Creatinine, Ser: 0.9 mg/dL (ref 0.44–1.00)
GFR calc Af Amer: >60 mL/min (ref >60)
GFR calc non Af Amer: 59 mL/min — ABNORMAL LOW (ref >60)
GLUCOSE: 121 mg/dL — AB (ref 70–99)
POTASSIUM: 4.1 mmol/L (ref 3.5–5.1)
SODIUM: 141 mmol/L (ref 135–145)
TOTAL PROTEIN: 6.8 g/dL (ref 6.5–8.1)

## 2018-09-14 LAB — CBC
HEMATOCRIT: 36.1 % (ref 36.0–46.0)
HEMOGLOBIN: 10.4 g/dL — AB (ref 12.0–15.0)
MCH: 23 pg — ABNORMAL LOW (ref 26.0–34.0)
MCHC: 28.8 g/dL — ABNORMAL LOW (ref 30.0–36.0)
MCV: 79.7 fL — ABNORMAL LOW (ref 80.0–100.0)
NRBC: 0 % (ref 0.0–0.2)
Platelets: 354 10*3/uL (ref 150–400)
RBC: 4.53 MIL/uL (ref 3.87–5.11)
RDW: 17.2 % — ABNORMAL HIGH (ref 11.5–15.5)
WBC: 6.4 10*3/uL (ref 4.0–10.5)

## 2018-09-14 LAB — TSH: TSH: 1.94 u[IU]/mL (ref 0.350–4.500)

## 2018-09-14 MED ORDER — FUROSEMIDE 20 MG PO TABS: ORAL_TABLET | ORAL | 3 refills | Status: DC

## 2018-09-14 NOTE — Patient Instructions (Signed)
Start Lasix -- take 2 tablets a day for 3 days then reduce to 1 tablet a day     Low-Sodium Eating Plan Sodium, which is an element that makes up salt, helps you maintain a healthy balance of fluids in your body. Too much sodium can increase your blood pressure and cause fluid and waste to be held in your body. Your health care provider or dietitian may recommend following this plan if you have high blood pressure (hypertension), kidney disease, liver disease, or heart failure. Eating less sodium can help lower your blood pressure, reduce swelling, and protect your heart, liver, and kidneys. What are tips for following this plan? General guidelines  Most people on this plan should limit their sodium intake to 1,500-2,000 mg (milligrams) of sodium each day. Reading food labels  The Nutrition Facts label lists the amount of sodium in one serving of the food. If you eat more than one serving, you must multiply the listed amount of sodium by the number of servings.  Choose foods with less than 140 mg of sodium per serving.  Avoid foods with 300 mg of sodium or more per serving. Shopping  Look for lower-sodium products, often labeled as "low-sodium" or "no salt added."  Always check the sodium content even if foods are labeled as "unsalted" or "no salt added".  Buy fresh foods. ? Avoid canned foods and premade or frozen meals. ? Avoid canned, cured, or processed meats  Buy breads that have less than 80 mg of sodium per slice. Cooking  Eat more home-cooked food and less restaurant, buffet, and fast food.  Avoid adding salt when cooking. Use salt-free seasonings or herbs instead of table salt or sea salt. Check with your health care provider or pharmacist before using salt substitutes.  Cook with plant-based oils, such as canola, sunflower, or olive oil. Meal planning  When eating at a restaurant, ask that your food be prepared with less salt or no salt, if possible.  Avoid foods  that contain MSG (monosodium glutamate). MSG is sometimes added to [place] food, bouillon, and some canned foods. What foods are recommended? The items listed may not be a complete list. Talk with your dietitian about what dietary choices are best for you. Grains Low-sodium cereals, including oats, puffed wheat and rice, and shredded wheat. Low-sodium crackers. Unsalted rice. Unsalted pasta. Low-sodium bread. Whole-grain breads and whole-grain pasta. Vegetables Fresh or frozen vegetables. "No salt added" canned vegetables. "No salt added" tomato sauce and paste. Low-sodium or reduced-sodium tomato and vegetable juice. Fruits Fresh, frozen, or canned fruit. Fruit juice. Meats and other protein foods Fresh or frozen (no salt added) meat, poultry, seafood, and fish. Low-sodium canned tuna and salmon. Unsalted nuts. Dried peas, beans, and lentils without added salt. Unsalted canned beans. Eggs. Unsalted nut butters. Dairy Milk. Soy milk. Cheese that is naturally low in sodium, such as ricotta cheese, fresh mozzarella, or Swiss cheese Low-sodium or reduced-sodium cheese. Cream cheese. Yogurt. Fats and oils Unsalted butter. Unsalted margarine with no trans fat. Vegetable oils such as canola or olive oils. Seasonings and other foods Fresh and dried herbs and spices. Salt-free seasonings. Low-sodium mustard and ketchup. Sodium-free salad dressing. Sodium-free light mayonnaise. Fresh or refrigerated horseradish. Lemon juice. Vinegar. Homemade, reduced-sodium, or low-sodium soups. Unsalted popcorn and pretzels. Low-salt or salt-free chips. What foods are not recommended? The items listed may not be a complete list. Talk with your dietitian about what dietary choices are best for you. Grains Instant hot cereals. Bread stuffing,  pancake, and biscuit mixes. Croutons. Seasoned rice or pasta mixes. Noodle soup cups. Boxed or frozen macaroni and cheese. Regular salted crackers. Self-rising  flour. Vegetables Sauerkraut, pickled vegetables, and relishes. Olives. [place] fries. Onion rings. Regular canned vegetables (not low-sodium or reduced-sodium). Regular canned tomato sauce and paste (not low-sodium or reduced-sodium). Regular tomato and vegetable juice (not low-sodium or reduced-sodium). Frozen vegetables in sauces. Meats and other protein foods Meat or fish that is salted, canned, smoked, spiced, or pickled. Bacon, ham, sausage, hotdogs, corned beef, chipped beef, packaged lunch meats, salt pork, jerky, pickled herring, anchovies, regular canned tuna, sardines, salted nuts. Dairy Processed cheese and cheese spreads. Cheese curds. Blue cheese. Feta cheese. String cheese. Regular cottage cheese. Buttermilk. Canned milk. Fats and oils Salted butter. Regular margarine. Ghee. Bacon fat. Seasonings and other foods Onion salt, garlic salt, seasoned salt, table salt, and sea salt. Canned and packaged gravies. Worcestershire sauce. Tartar sauce. Barbecue sauce. Teriyaki sauce. Soy sauce, including reduced-sodium. Steak sauce. Fish sauce. Oyster sauce. Cocktail sauce. Horseradish that you find on the shelf. Regular ketchup and mustard. Meat flavorings and tenderizers. Bouillon cubes. Hot sauce and Tabasco sauce. Premade or packaged marinades. Premade or packaged taco seasonings. Relishes. Regular salad dressings. Salsa. Potato and tortilla chips. Corn chips and puffs. Salted popcorn and pretzels. Canned or dried soups. Pizza. Frozen entrees and pot pies. Summary  Eating less sodium can help lower your blood pressure, reduce swelling, and protect your heart, liver, and kidneys.  Most people on this plan should limit their sodium intake to 1,500-2,000 mg (milligrams) of sodium each day.  Canned, boxed, and frozen foods are high in sodium. Restaurant foods, fast foods, and pizza are also very high in sodium. You also get sodium by adding salt to food.  Try to cook at home, eat more fresh  fruits and vegetables, and eat less fast food, canned, processed, or prepared foods. This information is not intended to replace advice given to you by your health care provider. Make sure you discuss any questions you have with your health care provider. Document Released: 04/30/2002 Document Revised: 11/01/2016 Document Reviewed: 11/01/2016 Elsevier Interactive Patient Education  [employer].

## 2018-09-14 NOTE — Progress Notes (Signed)
Primary Care Physician: Ignatius Specking, MD Referring Physician:   [name] is a 77 y.o. female with a h/o persistent afib that is in the afib clinic for evaluation of weight gain, LLE, shortness of breath with exertion for several weeks. She has had permanent  afib for years and cannot take anticoagulation due to h/o GI bleeding. She has been  seen by Dr. Johney [name] for consideration for Watchman, but was felt that she would not be able to tolerate any anticoagulation. She has history of poor  rate control  but in office today afib is rate controlled at 85 bpm. She states that she does snore, had sleep study that showed sleep apnea and cannot tolerate mask. No alcohol or tobacco. She is sedentary and obese.Chadsvasc score is at least 4. She eats salty snacks and foods. She weighted 219 lbs in Dr. Junius [name] office in August and she is 229 lbs today.  Today, she denies symptoms of palpitations, chest pain, orthopnea, PND, lower extremity edema, dizziness, presyncope, syncope, or neurologic sequela .C/o of  exertional shortness of breath,LLE, weight gain. The patient is tolerating medications without difficulties and is otherwise without complaint today.   Past Medical History:  Diagnosis Date  . Anemia    secondary to acute on chronic GI bleed  . Anxiety disorder   . Diverticulosis   . Exertional dyspnea    Normal LVF, 2-D echo, 09/2012  . Hiatal hernia   . Hypercholesterolemia   . Hypertension   . Obesity   . Osteoarthritis   . Tobacco abuse    Past Surgical History:  Procedure Laterality Date  . ABDOMINAL HYSTERECTOMY    . GIVENS CAPSULE STUDY N/A 02/19/2013   Procedure: GIVENS CAPSULE STUDY;  Surgeon: Malissa Hippo, MD;  Location: [facility];  Service: Endoscopy;  Laterality: N/A;  730    Current Outpatient Medications  Medication Sig Dispense Refill  . BREO ELLIPTA 100-25 MCG/INH AEPB Inhale 1 puff into the lungs daily.     . clonazePAM (KLONOPIN) 0.5 MG tablet Take 0.5  mg by mouth 2 (two) times daily as needed for anxiety.     Marland Kitchen diltiazem (CARDIZEM CD) 360 MG 24 hr capsule Take 1 capsule (360 mg total) by mouth daily. 90 capsule 3  . metoprolol succinate (TOPROL XL) 25 MG 24 hr tablet Take 1 tablet (25 mg total) by mouth at bedtime. 90 tablet 3  . metoprolol succinate (TOPROL-XL) 50 MG 24 hr tablet Take 1 tablet (50 mg total) by mouth every morning. 90 tablet 3  . naproxen sodium (ALEVE) 220 MG tablet Take 220-440 mg by mouth daily as needed. For pain    . OXYGEN Inhale 3 L into the lungs at bedtime.    Marland Kitchen tetrahydrozoline-zinc (VISINE-AC) 0.05-0.25 % ophthalmic solution Place 2 drops into both eyes 3 (three) times daily as needed (Red Itchy Eyes).    . furosemide (LASIX) 20 MG tablet Take 2 tablets by mouth for 3 days then reduce to 1 tablet a day 36 tablet 3   No current facility-administered medications for this encounter.     Allergies  Allergen Reactions  . Shellfish Allergy Hives, Itching and Swelling    Social History   Socioeconomic History  . Marital status: Widowed    Spouse name: Not on file  . Number of children: Not on file  . Years of education: Not on file  . Highest education level: Not on file  Occupational History  . Not on file  Social Needs  . Financial resource strain: Not on file  . Food insecurity:    Worry: Not on file    Inability: Not on file  . Transportation needs:    Medical: Not on file    Non-medical: Not on file  Tobacco Use  . Smoking status: Former Smoker    Packs/day: 0.50    Years: 20.00    Pack years: 10.00    Types: Cigarettes    Last attempt to quit: 06/22/2012    Years since quitting: 6.2  . Smokeless tobacco: Never Used  Substance and Sexual Activity  . Alcohol use: No  . Drug use: No  . Sexual activity: Not on file  Lifestyle  . Physical activity:    Days per week: Not on file    Minutes per session: Not on file  . Stress: Not on file  Relationships  . Social connections:    Talks on  phone: Not on file    Gets together: Not on file    Attends religious service: Not on file    Active member of club or organization: Not on file    Attends meetings of clubs or organizations: Not on file    Relationship status: Not on file  . Intimate partner violence:    Fear of current or ex partner: Not on file    Emotionally abused: Not on file    Physically abused: Not on file    Forced sexual activity: Not on file  Other Topics Concern  . Not on file  Social History Narrative  . Not on file    Family History  Problem Relation Age of Onset  . Hypertension Brother     ROS- All systems are reviewed and negative except as per the HPI above  Physical Exam: Vitals:   09/14/18 1020  BP: 136/74  Pulse: 85  SpO2: 97%  Weight: 103.9 kg  Height: 5\' 2"  (1.575 m)   Wt Readings from Last 3 Encounters:  09/14/18 103.9 kg  07/17/18 99.8 kg  10/19/17 106.1 kg    Labs: Lab Results  Component Value Date   NA 137 08/13/2016   K 3.8 08/13/2016   CL 111 08/13/2016   CO2 25 08/13/2016   GLUCOSE 114 (H) 08/13/2016   BUN 11 08/13/2016   CREATININE 0.80 08/13/2016   CALCIUM 8.2 (L) 08/13/2016   MG 1.9 08/12/2016   No results found for: INR No results found for: CHOL, HDL, LDLCALC, TRIG   GEN- The patient is chronically ill  appearing, alert and oriented x 3 today.   Head- normocephalic, atraumatic Eyes-  Sclera clear, conjunctiva pink Ears- hearing intact Oropharynx- clear Neck- supple, no JVP Lymph- no cervical lymphadenopathy Lungs- Clear to ausculation bilaterally, normal work of breathing Heart irregular rate and rhythm, no murmurs, rubs or gallops, PMI not laterally displaced GI- soft, NT, ND, + BS Extremities- no clubbing, cyanosis, 2+ LLE MS- no significant deformity or atrophy Skin- no rash or lesion Psych- euthymic mood, full affect Neuro- strength and sensation are intact  EKG-afib at 85 bpm  Echo-2017-Left ventricle: The cavity size was normal. Wall  thickness was   increased in a pattern of moderate LVH. Systolic function was   normal. The estimated ejection fraction was in the range of 50%   to 55%. Wall motion was normal; there were no regional wall   motion abnormalities. The study was not technically sufficient to   allow evaluation of LV diastolic dysfunction due to atrial  fibrillation. - Aortic valve: Mildly calcified annulus. Trileaflet; mildly   thickened leaflets. There was mild regurgitation. Valve area   (VTI): 1.63 cm^2. Valve area (Vmax): 1.58 cm^2. Valve area   (Vmean): 1.64 cm^2. - Mitral valve: Mildly calcified annulus. Mildly thickened leaflets   . There was mild regurgitation. - Left atrium: The atrium was mildly dilated. - Right ventricle: The cavity size was mildly dilated. - Right atrium: The atrium was mildly dilated. - Tricuspid valve: There was moderate regurgitation. The TR VC is   0.5 cm. - Pulmonary arteries: Systolic pressure was mildly increased. PA   peak pressure: 31 mm Hg (S). - Technically adequate study.  85 bpm, qrs int 82 ms, qtc 456 ms    Assessment and Plan: 1. Permanent afib She is rate controlled today Unfortunately rate control is her goal not being able to take anticoagulation due to previous GI bleeding Deemed not to be a candidate for Watchman due to above Continue BB/CCB at current doses     2. 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 OSA Does not use cpap  4. Poor dietary discretion  Eatshigh salt diet Info re low salt diet given  3.BMI 41.88 Encouraged weight loss Encouraged to be active but she states that she has knee problems   f/u in 10 days   Lupita Leash [facility] [address] [place], [place] [address] [phone]

## 2018-09-27 ENCOUNTER — Encounter (HOSPITAL_COMMUNITY): Payer: Self-pay | Admitting: Nurse Practitioner

## 2018-09-27 ENCOUNTER — Ambulatory Visit (HOSPITAL_BASED_OUTPATIENT_CLINIC_OR_DEPARTMENT_OTHER)
Admission: RE | Admit: 2018-09-27 | Discharge: 2018-09-27 | Disposition: A | Discharge status: Home / Self Care | Payer: Medicare Other | Source: Ambulatory Visit | Attending: Nurse Practitioner | Admitting: Nurse Practitioner

## 2018-09-27 ENCOUNTER — Ambulatory Visit (HOSPITAL_COMMUNITY)
Admission: RE | Admit: 2018-09-27 | Discharge: 2018-09-27 | Disposition: A | Discharge status: Home / Self Care | Payer: Medicare Other | Source: Ambulatory Visit | Attending: Nurse Practitioner | Admitting: Nurse Practitioner

## 2018-09-27 VITALS — BP 142/74 | HR 84 | Ht 62.0 in | Wt 227.0 lb

## 2018-09-27 DIAGNOSIS — Z90710 Acquired absence of both cervix and uterus: Secondary | ICD-10-CM | POA: Diagnosis not present

## 2018-09-27 DIAGNOSIS — R0609 Other forms of dyspnea: Secondary | ICD-10-CM | POA: Diagnosis not present

## 2018-09-27 DIAGNOSIS — Z9981 Dependence on supplemental oxygen: Secondary | ICD-10-CM | POA: Diagnosis not present

## 2018-09-27 DIAGNOSIS — Z6841 Body Mass Index (BMI) 40.0 and over, adult: Secondary | ICD-10-CM | POA: Insufficient documentation

## 2018-09-27 DIAGNOSIS — G4733 Obstructive sleep apnea (adult) (pediatric): Secondary | ICD-10-CM | POA: Diagnosis not present

## 2018-09-27 DIAGNOSIS — Z87891 Personal history of nicotine dependence: Secondary | ICD-10-CM | POA: Diagnosis not present

## 2018-09-27 DIAGNOSIS — Z79899 Other long term (current) drug therapy: Secondary | ICD-10-CM | POA: Insufficient documentation

## 2018-09-27 DIAGNOSIS — R635 Abnormal weight gain: Secondary | ICD-10-CM | POA: Insufficient documentation

## 2018-09-27 DIAGNOSIS — I4821 Permanent atrial fibrillation: Secondary | ICD-10-CM

## 2018-09-27 DIAGNOSIS — I272 Pulmonary hypertension, unspecified: Secondary | ICD-10-CM | POA: Insufficient documentation

## 2018-09-27 DIAGNOSIS — Z8249 Family history of ischemic heart disease and other diseases of the circulatory system: Secondary | ICD-10-CM | POA: Insufficient documentation

## 2018-09-27 DIAGNOSIS — R0602 Shortness of breath: Secondary | ICD-10-CM | POA: Diagnosis not present

## 2018-09-27 DIAGNOSIS — M1990 Unspecified osteoarthritis, unspecified site: Secondary | ICD-10-CM | POA: Insufficient documentation

## 2018-09-27 DIAGNOSIS — Z91013 Allergy to seafood: Secondary | ICD-10-CM | POA: Diagnosis not present

## 2018-09-27 DIAGNOSIS — R600 Localized edema: Secondary | ICD-10-CM | POA: Insufficient documentation

## 2018-09-27 DIAGNOSIS — F419 Anxiety disorder, unspecified: Secondary | ICD-10-CM | POA: Diagnosis not present

## 2018-09-27 DIAGNOSIS — I4819 Other persistent atrial fibrillation: Secondary | ICD-10-CM | POA: Diagnosis present

## 2018-09-27 DIAGNOSIS — I10 Essential (primary) hypertension: Secondary | ICD-10-CM | POA: Diagnosis not present

## 2018-09-27 LAB — ECHOCARDIOGRAM COMPLETE
Height: 62 in
WEIGHTICAEL: 3632 [oz_av]

## 2018-09-27 NOTE — Progress Notes (Signed)
Primary Care Physician: Andrea Specking, MD Referring Physician:   [name] is a 77 y.o. female with a h/o persistent afib that is in the afib clinic for evaluation of weight gain, LLE, shortness of breath with exertion for several weeks. She has had permanent  afib for years and cannot take anticoagulation due to h/o GI bleeding. She has been  seen by Dr. Johney [name] for consideration for Watchman, but was felt that she would not be able to tolerate any anticoagulation. She has history of poor  rate control  but in office today afib is rate controlled at 85 bpm. She states that she does snore, had sleep study that showed sleep apnea and cannot tolerate mask. No alcohol or tobacco. She is sedentary and obese.Chadsvasc score is at least 4. She eats salty snacks and foods. She weighted 219 lbs in Dr. Junius [name] office in August and she is 229 lbs today.  F/u in afib clinic for f/u pedal edema and chronic shortness of breath with exertion. On last visit cxr showed venous hypertension without frank edema. Echo today shows moderate pulmonary HTN. Her afib is well controlled with v rate of 80. Labs showed a hgb at 10.4, but no recnt H/H to compare,otherwise cmet/tsh unremarkable. I started some lasix on last visit for c/o of pedal edema and she lsot 2 lbs, pt does not think it has been that helpful, so will stop. Echo today show moderate HTN, with severely dilated rt atrium.  Today, she denies symptoms of palpitations, chest pain, orthopnea, PND, lower extremity edema, dizziness, presyncope, syncope, or neurologic sequela .C/o of  exertional shortness of breath,LLE, weight gain. The patient is tolerating medications without difficulties and is otherwise without complaint today.   Past Medical History:  Diagnosis Date  . Anemia    secondary to acute on chronic GI bleed  . Anxiety disorder   . Diverticulosis   . Exertional dyspnea    Normal LVF, 2-D echo, 09/2012  . Hiatal hernia   .  Hypercholesterolemia   . Hypertension   . Obesity   . Osteoarthritis   . Tobacco abuse    Past Surgical History:  Procedure Laterality Date  . ABDOMINAL HYSTERECTOMY    . GIVENS CAPSULE STUDY N/A 02/19/2013   Procedure: GIVENS CAPSULE STUDY;  Surgeon: Andrea Hippo, MD;  Location: [facility];  Service: Endoscopy;  Laterality: N/A;  730    Current Outpatient Medications  Medication Sig Dispense Refill  . BREO ELLIPTA 100-25 MCG/INH AEPB Inhale 1 puff into the lungs daily.     . clonazePAM (KLONOPIN) 0.5 MG tablet Take 0.5 mg by mouth 2 (two) times daily as needed for anxiety.     Marland Kitchen diltiazem (CARDIZEM CD) 360 MG 24 hr capsule Take 1 capsule (360 mg total) by mouth daily. 90 capsule 3  . metoprolol succinate (TOPROL XL) 25 MG 24 hr tablet Take 1 tablet (25 mg total) by mouth at bedtime. 90 tablet 3  . metoprolol succinate (TOPROL-XL) 50 MG 24 hr tablet Take 1 tablet (50 mg total) by mouth every morning. 90 tablet 3  . naproxen sodium (ALEVE) 220 MG tablet Take 220-440 mg by mouth daily as needed. For pain    . OXYGEN Inhale 3 L into the lungs at bedtime.    Marland Kitchen tetrahydrozoline-zinc (VISINE-AC) 0.05-0.25 % ophthalmic solution Place 2 drops into both eyes 3 (three) times daily as needed (Red Itchy Eyes).     No current facility-administered medications for this encounter.  Allergies  Allergen Reactions  . Shellfish Allergy Hives, Itching and Swelling    Social History   Socioeconomic History  . Marital status: Widowed    Spouse name: Not on file  . Number of children: Not on file  . Years of education: Not on file  . Highest education level: Not on file  Occupational History  . Not on file  Social Needs  . Financial resource strain: Not on file  . Food insecurity:    Worry: Not on file    Inability: Not on file  . Transportation needs:    Medical: Not on file    Non-medical: Not on file  Tobacco Use  . Smoking status: Former Smoker    Packs/day: 0.50    Years:  20.00    Pack years: 10.00    Types: Cigarettes    Last attempt to quit: 06/22/2012    Years since quitting: 6.2  . Smokeless tobacco: Never Used  Substance and Sexual Activity  . Alcohol use: No  . Drug use: No  . Sexual activity: Not on file  Lifestyle  . Physical activity:    Days per week: Not on file    Minutes per session: Not on file  . Stress: Not on file  Relationships  . Social connections:    Talks on phone: Not on file    Gets together: Not on file    Attends religious service: Not on file    Active member of club or organization: Not on file    Attends meetings of clubs or organizations: Not on file    Relationship status: Not on file  . Intimate partner violence:    Fear of current or ex partner: Not on file    Emotionally abused: Not on file    Physically abused: Not on file    Forced sexual activity: Not on file  Other Topics Concern  . Not on file  Social History Narrative  . Not on file    Family History  Problem Relation Age of Onset  . Hypertension Brother     ROS- All systems are reviewed and negative except as per the HPI above  Physical Exam: Vitals:   09/27/18 1429  BP: (!) 142/74  Pulse: 84  Weight: 103 kg  Height: 5\' 2"  (1.575 m)   Wt Readings from Last 3 Encounters:  09/27/18 103 kg  09/14/18 103.9 kg  07/17/18 99.8 kg    Labs: Lab Results  Component Value Date   NA 141 09/14/2018   K 4.1 09/14/2018   CL 107 09/14/2018   CO2 25 09/14/2018   GLUCOSE 121 (H) 09/14/2018   BUN 14 09/14/2018   CREATININE 0.90 09/14/2018   CALCIUM 9.0 09/14/2018   MG 1.9 08/12/2016   No results found for: INR No results found for: CHOL, HDL, LDLCALC, TRIG   GEN- The patient is chronically ill  appearing, alert and oriented x 3 today.   Head- normocephalic, atraumatic Eyes-  Sclera clear, conjunctiva pink Ears- hearing intact Oropharynx- clear Neck- supple, no JVP Lymph- no cervical lymphadenopathy Lungs- Clear to ausculation  bilaterally, normal work of breathing Heart irregular rate and rhythm, no murmurs, rubs or gallops, PMI not laterally displaced GI- soft, NT, ND, + BS Extremities- no clubbing, cyanosis, 2+ LLE MS- no significant deformity or atrophy Skin- no rash or lesion Psych- euthymic mood, full affect Neuro- strength and sensation are intact  EKG-afib at 85 bpm  Echo-2017-Left ventricle: The cavity size was normal.  Wall thickness was   increased in a pattern of moderate LVH. Systolic function was   normal. The estimated ejection fraction was in the range of 50%   to 55%. Wall motion was normal; there were no regional wall   motion abnormalities. The study was not technically sufficient to   allow evaluation of LV diastolic dysfunction due to atrial   fibrillation. - Aortic valve: Mildly calcified annulus. Trileaflet; mildly   thickened leaflets. There was mild regurgitation. Valve area   (VTI): 1.63 cm^2. Valve area (Vmax): 1.58 cm^2. Valve area   (Vmean): 1.64 cm^2. - Mitral valve: Mildly calcified annulus. Mildly thickened leaflets   . There was mild regurgitation. - Left atrium: The atrium was mildly dilated. - Right ventricle: The cavity size was mildly dilated. - Right atrium: The atrium was mildly dilated. - Tricuspid valve: There was moderate regurgitation. The TR VC is   0.5 cm. - Pulmonary arteries: Systolic pressure was mildly increased. PA   peak pressure: 31 mm Hg (S). - Technically adequate study.  85 bpm, qrs int 82 ms, qtc 456 ms  Echo 09/27/18- Study Conclusions  - Left ventricle: Average global longitudinal LV strain is normal   at -21.7. The cavity size was normal. There was mild concentric   hypertrophy. Systolic function was normal. The estimated ejection   fraction was in the range of 60% to 65%. Wall motion was normal;   there were no regional wall motion abnormalities. The study was   not technically sufficient to allow evaluation of LV diastolic   dysfunction  due to atrial fibrillation. - Aortic valve: Trileaflet; normal thickness, mildly calcified   leaflets. There was trivial regurgitation. - Mitral valve: Calcified annulus. There was mild regurgitation. - Left atrium: The atrium was moderately dilated. - Right atrium: The atrium was severely dilated. - Tricuspid valve: There was mild regurgitation. - Pulmonic valve: There was trivial regurgitation. - Pulmonary arteries: PA peak pressure: 43 mm Hg (S).  Impressions:  - The right ventricular systolic pressure was increased consistent   with moderate pulmonary hypertension.  Assessment and Plan: 1. Permanent afib She is rate controlled today Unfortunately rate control is her goal not being able to take anticoagulation due to previous GI bleeding Deemed not to be a candidate for Watchman due to above Continue BB/CCB at current doses     2. Weight gain, LLE , increased shortness of breath with exertion Echo updated, cxr and echo show evidence for pulmonary HTN  Lasix has not helped with pedal edema or shortest of breath so will stop Knee high support socks suggested I feel her shortest of breath and pedal edema(mild today, just top of feet) are multifactorial, pul htn, obesity, sedentary/deconditioning/ mild anemia, untreated sleep apnea  3. OSA Does not use cpap  4. Poor dietary discretion  Eats high salt diet Info re low salt diet given  3.BMI 41.88 Encouraged weight loss Encouraged to be active but she states that she has knee problems   F/u with Dr. Purvis Sheffield 2/27 Will forward CBC to PCP  Andrea [name] C[facility] [address] [place], [place] [address] [phone]

## 2018-09-27 NOTE — Progress Notes (Signed)
  Echocardiogram 2D Echocardiogram has been performed.  Andrea [name] 09/27/2018, 3:51 PM

## 2018-11-20 DIAGNOSIS — K219 Gastro-esophageal reflux disease without esophagitis: Secondary | ICD-10-CM | POA: Diagnosis not present

## 2018-11-20 DIAGNOSIS — K449 Diaphragmatic hernia without obstruction or gangrene: Secondary | ICD-10-CM | POA: Diagnosis not present

## 2018-11-20 DIAGNOSIS — Z79899 Other long term (current) drug therapy: Secondary | ICD-10-CM | POA: Diagnosis not present

## 2018-11-20 DIAGNOSIS — M7918 Myalgia, other site: Secondary | ICD-10-CM | POA: Diagnosis not present

## 2018-11-20 DIAGNOSIS — I517 Cardiomegaly: Secondary | ICD-10-CM | POA: Diagnosis not present

## 2018-11-20 DIAGNOSIS — R918 Other nonspecific abnormal finding of lung field: Secondary | ICD-10-CM | POA: Diagnosis not present

## 2018-11-20 DIAGNOSIS — I10 Essential (primary) hypertension: Secondary | ICD-10-CM | POA: Diagnosis not present

## 2018-11-20 DIAGNOSIS — M791 Myalgia, unspecified site: Secondary | ICD-10-CM | POA: Diagnosis not present

## 2019-01-18 ENCOUNTER — Encounter: Payer: Self-pay | Admitting: Cardiovascular Disease

## 2019-01-18 ENCOUNTER — Ambulatory Visit (INDEPENDENT_AMBULATORY_CARE_PROVIDER_SITE_OTHER): Payer: Medicare Other | Admitting: Cardiovascular Disease

## 2019-01-18 VITALS — BP 153/83 | HR 88 | Ht 62.0 in | Wt 225.2 lb

## 2019-01-18 DIAGNOSIS — Z8719 Personal history of other diseases of the digestive system: Secondary | ICD-10-CM | POA: Diagnosis not present

## 2019-01-18 DIAGNOSIS — I4821 Permanent atrial fibrillation: Secondary | ICD-10-CM | POA: Diagnosis not present

## 2019-01-18 DIAGNOSIS — G4733 Obstructive sleep apnea (adult) (pediatric): Secondary | ICD-10-CM

## 2019-01-18 DIAGNOSIS — I10 Essential (primary) hypertension: Secondary | ICD-10-CM | POA: Diagnosis not present

## 2019-01-18 DIAGNOSIS — R400 Somnolence: Secondary | ICD-10-CM

## 2019-01-18 NOTE — Addendum Note (Signed)
Addended by: Lesle Chris on: 01/18/2019 01:18 PM   Modules accepted: Orders

## 2019-01-18 NOTE — Progress Notes (Signed)
SUBJECTIVE: The patient presents for follow-up of permanent atrial fibrillation and hypertension. 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 She cannot take anticoagulation due to a history of GI bleeding. She is not a candidate for a watchman device due to an inability to tolerate any anticoagulation.  She is widowed and her godson lives with her.  She denies chest pain.  She has chronic exertional dyspnea which may be slightly worse.  She has COPD treated by her PCP.  She has palpitations.  She does not use oxygen regularly at night.  She was previously diagnosed with sleep apnea but never used CPAP and returned.  She underwent a normal coronary angiogram on 12/24/2014.  She has daytime somnolence.    Review of Systems: As per "subjective", otherwise negative.  Allergies  Allergen Reactions  . Shellfish Allergy Hives, Itching and Swelling    Current Outpatient Medications  Medication Sig Dispense Refill  . BREO ELLIPTA 100-25 MCG/INH AEPB Inhale 1 puff into the lungs daily.     . clonazePAM (KLONOPIN) 0.5 MG tablet Take 0.5 mg by mouth 2 (two) times daily as needed for anxiety.     Marland Kitchen diltiazem (CARDIZEM CD) 360 MG 24 hr capsule Take 1 capsule (360 mg total) by mouth daily. 90 capsule 3  . metoprolol succinate (TOPROL XL) 25 MG 24 hr tablet Take 1 tablet (25 mg total) by mouth at bedtime. 90 tablet 3  . metoprolol succinate (TOPROL-XL) 50 MG 24 hr tablet Take 1 tablet (50 mg total) by mouth every morning. 90 tablet 3  . naproxen sodium (ALEVE) 220 MG tablet Take 220-440 mg by mouth daily as needed. For pain    . OXYGEN Inhale 3 L into the lungs at bedtime.    Marland Kitchen tetrahydrozoline-zinc (VISINE-AC) 0.05-0.25 % ophthalmic solution Place 2 drops into both eyes 3 (three) times daily as needed (Red Itchy Eyes).     No current facility-administered medications for this visit.     Past Medical History:  Diagnosis Date  . Anemia    secondary to acute on chronic GI bleed    . Anxiety disorder   . Diverticulosis   . Exertional dyspnea    Normal LVF, 2-D echo, 09/2012  . Hiatal hernia   . Hypercholesterolemia   . Hypertension   . Obesity   . Osteoarthritis   . Tobacco abuse     Past Surgical History:  Procedure Laterality Date  . ABDOMINAL HYSTERECTOMY    . GIVENS CAPSULE STUDY N/A 02/19/2013   Procedure: GIVENS CAPSULE STUDY;  Surgeon: Malissa Hippo, MD;  Location: [facility];  Service: Endoscopy;  Laterality: N/A;  730    Social History   Socioeconomic History  . Marital status: Widowed    Spouse name: Not on file  . Number of children: Not on file  . Years of education: Not on file  . Highest education level: Not on file  Occupational History  . Not on file  Social Needs  . Financial resource strain: Not on file  . Food insecurity:    Worry: Not on file    Inability: Not on file  . Transportation needs:    Medical: Not on file    Non-medical: Not on file  Tobacco Use  . Smoking status: Former Smoker    Packs/day: 0.50    Years: 20.00    Pack years: 10.00    Types: Cigarettes    Last attempt to quit: 06/22/2012  Years since quitting: 6.5  . Smokeless tobacco: Never Used  Substance and Sexual Activity  . Alcohol use: No  . Drug use: No  . Sexual activity: Not on file  Lifestyle  . Physical activity:    Days per week: Not on file    Minutes per session: Not on file  . Stress: Not on file  Relationships  . Social connections:    Talks on phone: Not on file    Gets together: Not on file    Attends religious service: Not on file    Active member of club or organization: Not on file    Attends meetings of clubs or organizations: Not on file    Relationship status: Not on file  . Intimate partner violence:    Fear of current or ex partner: Not on file    Emotionally abused: Not on file    Physically abused: Not on file    Forced sexual activity: Not on file  Other Topics Concern  . Not on file  Social History  Narrative  . Not on file     Vitals:   01/18/19 1254  BP: (!) 153/83  Pulse: 88  SpO2: 97%  Weight: 225 lb 3.2 oz (102.2 kg)  Height: 5\' 2"  (1.575 m)    Wt Readings from Last 3 Encounters:  01/18/19 225 lb 3.2 oz (102.2 kg)  09/27/18 227 lb (103 kg)  09/14/18 229 lb (103.9 kg)     PHYSICAL EXAM General: NAD HEENT: Normal. Neck: No JVD, no thyromegaly. Lungs: Diffusely diminished breath sounds, no crackles or wheezes. CV: Regular rate and irregular rhythm, normal S1/S2, no S3, no murmur. No pretibial or periankle edema.  No carotid bruit.   Abdomen: Soft, nontender, no distention.  Neurologic: Alert and oriented.  Psych: Normal affect. Skin: Normal. Musculoskeletal: No gross deformities.    ECG: Reviewed above under Subjective   Labs: Lab Results  Component Value Date/Time   K 4.1 09/14/2018 10:43 AM   BUN 14 09/14/2018 10:43 AM   CREATININE 0.90 09/14/2018 10:43 AM   ALT 9 09/14/2018 10:43 AM   TSH 1.940 09/14/2018 10:43 AM   HGB 10.4 (L) 09/14/2018 10:43 AM     Lipids: No results found for: LDLCALC, LDLDIRECT, CHOL, TRIG, HDL     ASSESSMENT AND PLAN: 1.  Permanent atrial fibrillation: Rate control is her only option.  She is not an anticoagulation candidate nor a watchman device candidate.  Continue diltiazem and metoprolol succinate.  Heart rate is controlled.  2.  Hypertension: Blood pressure is mildly elevated.  No changes to therapy today.  3.  Sleep apnea: She never used CPAP.  She has daytime somnolence and fatigue.  I will make a referral to a sleep specialist.  4.  GI bleeding: History of transfusions.  Stable.  No recent hospitalizations.    Disposition: Follow up 6 months   Prentice Docker, M.D., F.A.C.C.

## 2019-01-18 NOTE — Patient Instructions (Signed)
Medication Instructions:  Continue all current medications.  Labwork:   Testing/Procedures:   Follow-Up: Your physician wants you to follow up in: 6 months.  You will receive a reminder letter in the mail one-two months in advance.  If you don't receive a letter, please call our office to schedule the follow up appointment   Any Other Special Instructions Will Be Listed Below (If Applicable). You have been referred to:  [facility]   If you need a refill on your cardiac medications before your next appointment, please call your pharmacy.  

## 2019-01-26 DIAGNOSIS — I10 Essential (primary) hypertension: Secondary | ICD-10-CM | POA: Diagnosis not present

## 2019-01-26 DIAGNOSIS — I4891 Unspecified atrial fibrillation: Secondary | ICD-10-CM | POA: Diagnosis not present

## 2019-01-26 DIAGNOSIS — J449 Chronic obstructive pulmonary disease, unspecified: Secondary | ICD-10-CM | POA: Diagnosis not present

## 2019-01-26 DIAGNOSIS — M159 Polyosteoarthritis, unspecified: Secondary | ICD-10-CM | POA: Diagnosis not present

## 2019-03-30 DIAGNOSIS — Z961 Presence of intraocular lens: Secondary | ICD-10-CM | POA: Diagnosis not present

## 2019-04-06 DIAGNOSIS — I4891 Unspecified atrial fibrillation: Secondary | ICD-10-CM | POA: Diagnosis not present

## 2019-04-06 DIAGNOSIS — I272 Pulmonary hypertension, unspecified: Secondary | ICD-10-CM | POA: Diagnosis not present

## 2019-04-06 DIAGNOSIS — D649 Anemia, unspecified: Secondary | ICD-10-CM | POA: Diagnosis not present

## 2019-04-06 DIAGNOSIS — K922 Gastrointestinal hemorrhage, unspecified: Secondary | ICD-10-CM | POA: Diagnosis not present

## 2019-04-06 DIAGNOSIS — D62 Acute posthemorrhagic anemia: Secondary | ICD-10-CM | POA: Diagnosis not present

## 2019-04-06 DIAGNOSIS — J449 Chronic obstructive pulmonary disease, unspecified: Secondary | ICD-10-CM | POA: Diagnosis not present

## 2019-04-06 DIAGNOSIS — I10 Essential (primary) hypertension: Secondary | ICD-10-CM | POA: Diagnosis not present

## 2019-04-06 DIAGNOSIS — R0602 Shortness of breath: Secondary | ICD-10-CM | POA: Diagnosis not present

## 2019-04-07 DIAGNOSIS — Z91013 Allergy to seafood: Secondary | ICD-10-CM | POA: Diagnosis not present

## 2019-04-07 DIAGNOSIS — Z91041 Radiographic dye allergy status: Secondary | ICD-10-CM | POA: Diagnosis not present

## 2019-04-07 DIAGNOSIS — I4891 Unspecified atrial fibrillation: Secondary | ICD-10-CM | POA: Diagnosis present

## 2019-04-07 DIAGNOSIS — D62 Acute posthemorrhagic anemia: Secondary | ICD-10-CM | POA: Diagnosis not present

## 2019-04-07 DIAGNOSIS — I272 Pulmonary hypertension, unspecified: Secondary | ICD-10-CM | POA: Diagnosis present

## 2019-04-07 DIAGNOSIS — Z882 Allergy status to sulfonamides status: Secondary | ICD-10-CM | POA: Diagnosis not present

## 2019-04-07 DIAGNOSIS — I10 Essential (primary) hypertension: Secondary | ICD-10-CM | POA: Diagnosis not present

## 2019-04-07 DIAGNOSIS — K922 Gastrointestinal hemorrhage, unspecified: Secondary | ICD-10-CM | POA: Diagnosis not present

## 2019-04-07 DIAGNOSIS — J449 Chronic obstructive pulmonary disease, unspecified: Secondary | ICD-10-CM | POA: Diagnosis present

## 2019-04-07 DIAGNOSIS — Z87891 Personal history of nicotine dependence: Secondary | ICD-10-CM | POA: Diagnosis not present

## 2019-04-07 DIAGNOSIS — Z9981 Dependence on supplemental oxygen: Secondary | ICD-10-CM | POA: Diagnosis not present

## 2019-04-07 DIAGNOSIS — D649 Anemia, unspecified: Secondary | ICD-10-CM | POA: Diagnosis not present

## 2019-04-12 DIAGNOSIS — I470 Re-entry ventricular arrhythmia: Secondary | ICD-10-CM | POA: Diagnosis not present

## 2019-04-12 DIAGNOSIS — Z888 Allergy status to other drugs, medicaments and biological substances status: Secondary | ICD-10-CM | POA: Diagnosis not present

## 2019-04-12 DIAGNOSIS — M5489 Other dorsalgia: Secondary | ICD-10-CM | POA: Diagnosis not present

## 2019-04-12 DIAGNOSIS — I482 Chronic atrial fibrillation, unspecified: Secondary | ICD-10-CM | POA: Diagnosis not present

## 2019-04-12 DIAGNOSIS — F419 Anxiety disorder, unspecified: Secondary | ICD-10-CM | POA: Diagnosis not present

## 2019-04-12 DIAGNOSIS — Z8719 Personal history of other diseases of the digestive system: Secondary | ICD-10-CM | POA: Diagnosis not present

## 2019-04-12 DIAGNOSIS — K219 Gastro-esophageal reflux disease without esophagitis: Secondary | ICD-10-CM | POA: Diagnosis not present

## 2019-04-12 DIAGNOSIS — G4733 Obstructive sleep apnea (adult) (pediatric): Secondary | ICD-10-CM | POA: Diagnosis not present

## 2019-04-12 DIAGNOSIS — I517 Cardiomegaly: Secondary | ICD-10-CM | POA: Diagnosis not present

## 2019-04-12 DIAGNOSIS — M47812 Spondylosis without myelopathy or radiculopathy, cervical region: Secondary | ICD-10-CM | POA: Diagnosis not present

## 2019-04-12 DIAGNOSIS — Z79899 Other long term (current) drug therapy: Secondary | ICD-10-CM | POA: Diagnosis not present

## 2019-04-12 DIAGNOSIS — D649 Anemia, unspecified: Secondary | ICD-10-CM | POA: Diagnosis not present

## 2019-04-12 DIAGNOSIS — I10 Essential (primary) hypertension: Secondary | ICD-10-CM | POA: Diagnosis not present

## 2019-04-12 DIAGNOSIS — J449 Chronic obstructive pulmonary disease, unspecified: Secondary | ICD-10-CM | POA: Diagnosis not present

## 2019-04-12 DIAGNOSIS — Z91013 Allergy to seafood: Secondary | ICD-10-CM | POA: Diagnosis not present

## 2019-04-12 DIAGNOSIS — R52 Pain, unspecified: Secondary | ICD-10-CM | POA: Diagnosis not present

## 2019-04-12 DIAGNOSIS — I4891 Unspecified atrial fibrillation: Secondary | ICD-10-CM | POA: Diagnosis not present

## 2019-04-12 DIAGNOSIS — R200 Anesthesia of skin: Secondary | ICD-10-CM | POA: Diagnosis not present

## 2019-04-12 DIAGNOSIS — R0989 Other specified symptoms and signs involving the circulatory and respiratory systems: Secondary | ICD-10-CM | POA: Diagnosis not present

## 2019-04-12 DIAGNOSIS — R350 Frequency of micturition: Secondary | ICD-10-CM | POA: Diagnosis not present

## 2019-04-12 DIAGNOSIS — I272 Pulmonary hypertension, unspecified: Secondary | ICD-10-CM | POA: Diagnosis not present

## 2019-04-12 DIAGNOSIS — M542 Cervicalgia: Secondary | ICD-10-CM | POA: Diagnosis not present

## 2019-04-12 DIAGNOSIS — Z9981 Dependence on supplemental oxygen: Secondary | ICD-10-CM | POA: Diagnosis not present

## 2019-04-12 DIAGNOSIS — N3941 Urge incontinence: Secondary | ICD-10-CM | POA: Diagnosis not present

## 2019-04-12 DIAGNOSIS — D72829 Elevated white blood cell count, unspecified: Secondary | ICD-10-CM | POA: Diagnosis not present

## 2019-04-12 DIAGNOSIS — Z882 Allergy status to sulfonamides status: Secondary | ICD-10-CM | POA: Diagnosis not present

## 2019-04-13 DIAGNOSIS — I340 Nonrheumatic mitral (valve) insufficiency: Secondary | ICD-10-CM | POA: Diagnosis not present

## 2019-04-13 DIAGNOSIS — I272 Pulmonary hypertension, unspecified: Secondary | ICD-10-CM | POA: Diagnosis not present

## 2019-04-13 DIAGNOSIS — I070 Rheumatic tricuspid stenosis: Secondary | ICD-10-CM | POA: Diagnosis not present

## 2019-04-13 DIAGNOSIS — Z01810 Encounter for preprocedural cardiovascular examination: Secondary | ICD-10-CM | POA: Diagnosis not present

## 2019-04-13 DIAGNOSIS — D509 Iron deficiency anemia, unspecified: Secondary | ICD-10-CM | POA: Diagnosis not present

## 2019-04-13 DIAGNOSIS — I509 Heart failure, unspecified: Secondary | ICD-10-CM | POA: Diagnosis not present

## 2019-04-13 DIAGNOSIS — I4891 Unspecified atrial fibrillation: Secondary | ICD-10-CM | POA: Diagnosis not present

## 2019-04-14 DIAGNOSIS — I4891 Unspecified atrial fibrillation: Secondary | ICD-10-CM | POA: Diagnosis not present

## 2019-04-25 DIAGNOSIS — I4891 Unspecified atrial fibrillation: Secondary | ICD-10-CM | POA: Diagnosis not present

## 2019-04-25 DIAGNOSIS — Z6836 Body mass index (BMI) 36.0-36.9, adult: Secondary | ICD-10-CM | POA: Diagnosis not present

## 2019-04-25 DIAGNOSIS — G4730 Sleep apnea, unspecified: Secondary | ICD-10-CM | POA: Diagnosis not present

## 2019-04-25 DIAGNOSIS — I272 Pulmonary hypertension, unspecified: Secondary | ICD-10-CM | POA: Diagnosis not present

## 2019-04-25 DIAGNOSIS — Z299 Encounter for prophylactic measures, unspecified: Secondary | ICD-10-CM | POA: Diagnosis not present

## 2019-04-25 DIAGNOSIS — J449 Chronic obstructive pulmonary disease, unspecified: Secondary | ICD-10-CM | POA: Diagnosis not present

## 2019-05-15 DIAGNOSIS — H6123 Impacted cerumen, bilateral: Secondary | ICD-10-CM | POA: Diagnosis not present

## 2019-05-24 DIAGNOSIS — H10022 Other mucopurulent conjunctivitis, left eye: Secondary | ICD-10-CM | POA: Diagnosis not present

## 2019-06-25 DIAGNOSIS — T148XXA Other injury of unspecified body region, initial encounter: Secondary | ICD-10-CM | POA: Diagnosis not present

## 2019-06-25 DIAGNOSIS — I4891 Unspecified atrial fibrillation: Secondary | ICD-10-CM | POA: Diagnosis not present

## 2019-06-25 DIAGNOSIS — I10 Essential (primary) hypertension: Secondary | ICD-10-CM | POA: Diagnosis not present

## 2019-06-25 DIAGNOSIS — J449 Chronic obstructive pulmonary disease, unspecified: Secondary | ICD-10-CM | POA: Diagnosis not present

## 2019-06-25 DIAGNOSIS — Z87891 Personal history of nicotine dependence: Secondary | ICD-10-CM | POA: Diagnosis not present

## 2019-06-25 DIAGNOSIS — Z299 Encounter for prophylactic measures, unspecified: Secondary | ICD-10-CM | POA: Diagnosis not present

## 2019-09-07 DIAGNOSIS — Z1211 Encounter for screening for malignant neoplasm of colon: Secondary | ICD-10-CM | POA: Diagnosis not present

## 2019-09-07 DIAGNOSIS — Z6836 Body mass index (BMI) 36.0-36.9, adult: Secondary | ICD-10-CM | POA: Diagnosis not present

## 2019-09-07 DIAGNOSIS — Z1331 Encounter for screening for depression: Secondary | ICD-10-CM | POA: Diagnosis not present

## 2019-09-07 DIAGNOSIS — I4891 Unspecified atrial fibrillation: Secondary | ICD-10-CM | POA: Diagnosis not present

## 2019-09-07 DIAGNOSIS — R5383 Other fatigue: Secondary | ICD-10-CM | POA: Diagnosis not present

## 2019-09-07 DIAGNOSIS — Z1339 Encounter for screening examination for other mental health and behavioral disorders: Secondary | ICD-10-CM | POA: Diagnosis not present

## 2019-09-07 DIAGNOSIS — Z7189 Other specified counseling: Secondary | ICD-10-CM | POA: Diagnosis not present

## 2019-09-07 DIAGNOSIS — I10 Essential (primary) hypertension: Secondary | ICD-10-CM | POA: Diagnosis not present

## 2019-09-07 DIAGNOSIS — Z0000 Encounter for general adult medical examination without abnormal findings: Secondary | ICD-10-CM | POA: Diagnosis not present

## 2019-09-07 DIAGNOSIS — Z23 Encounter for immunization: Secondary | ICD-10-CM | POA: Diagnosis not present

## 2019-09-07 DIAGNOSIS — Z299 Encounter for prophylactic measures, unspecified: Secondary | ICD-10-CM | POA: Diagnosis not present

## 2019-09-07 DIAGNOSIS — E7800 Pure hypercholesterolemia, unspecified: Secondary | ICD-10-CM | POA: Diagnosis not present

## 2019-09-07 DIAGNOSIS — Z79899 Other long term (current) drug therapy: Secondary | ICD-10-CM | POA: Diagnosis not present

## 2019-10-01 DIAGNOSIS — I4891 Unspecified atrial fibrillation: Secondary | ICD-10-CM | POA: Diagnosis not present

## 2019-10-01 DIAGNOSIS — Z6836 Body mass index (BMI) 36.0-36.9, adult: Secondary | ICD-10-CM | POA: Diagnosis not present

## 2019-10-01 DIAGNOSIS — R7303 Prediabetes: Secondary | ICD-10-CM | POA: Diagnosis not present

## 2019-10-01 DIAGNOSIS — Z299 Encounter for prophylactic measures, unspecified: Secondary | ICD-10-CM | POA: Diagnosis not present

## 2019-10-01 DIAGNOSIS — F419 Anxiety disorder, unspecified: Secondary | ICD-10-CM | POA: Diagnosis not present

## 2019-10-01 DIAGNOSIS — J449 Chronic obstructive pulmonary disease, unspecified: Secondary | ICD-10-CM | POA: Diagnosis not present

## 2019-10-01 DIAGNOSIS — I10 Essential (primary) hypertension: Secondary | ICD-10-CM | POA: Diagnosis not present

## 2019-10-01 DIAGNOSIS — R7301 Impaired fasting glucose: Secondary | ICD-10-CM | POA: Diagnosis not present

## 2019-11-02 IMAGING — DX DG CHEST 2V
2 series · 2 of 2 positions shown · non-contrast
Comparison: 08/04/2017

CLINICAL DATA: Atrial fibrillation. Shortness of breath.
Palpitations. Duration of symptoms 2 weeks.

EXAM:
CHEST - 2 VIEW

[chest pa]
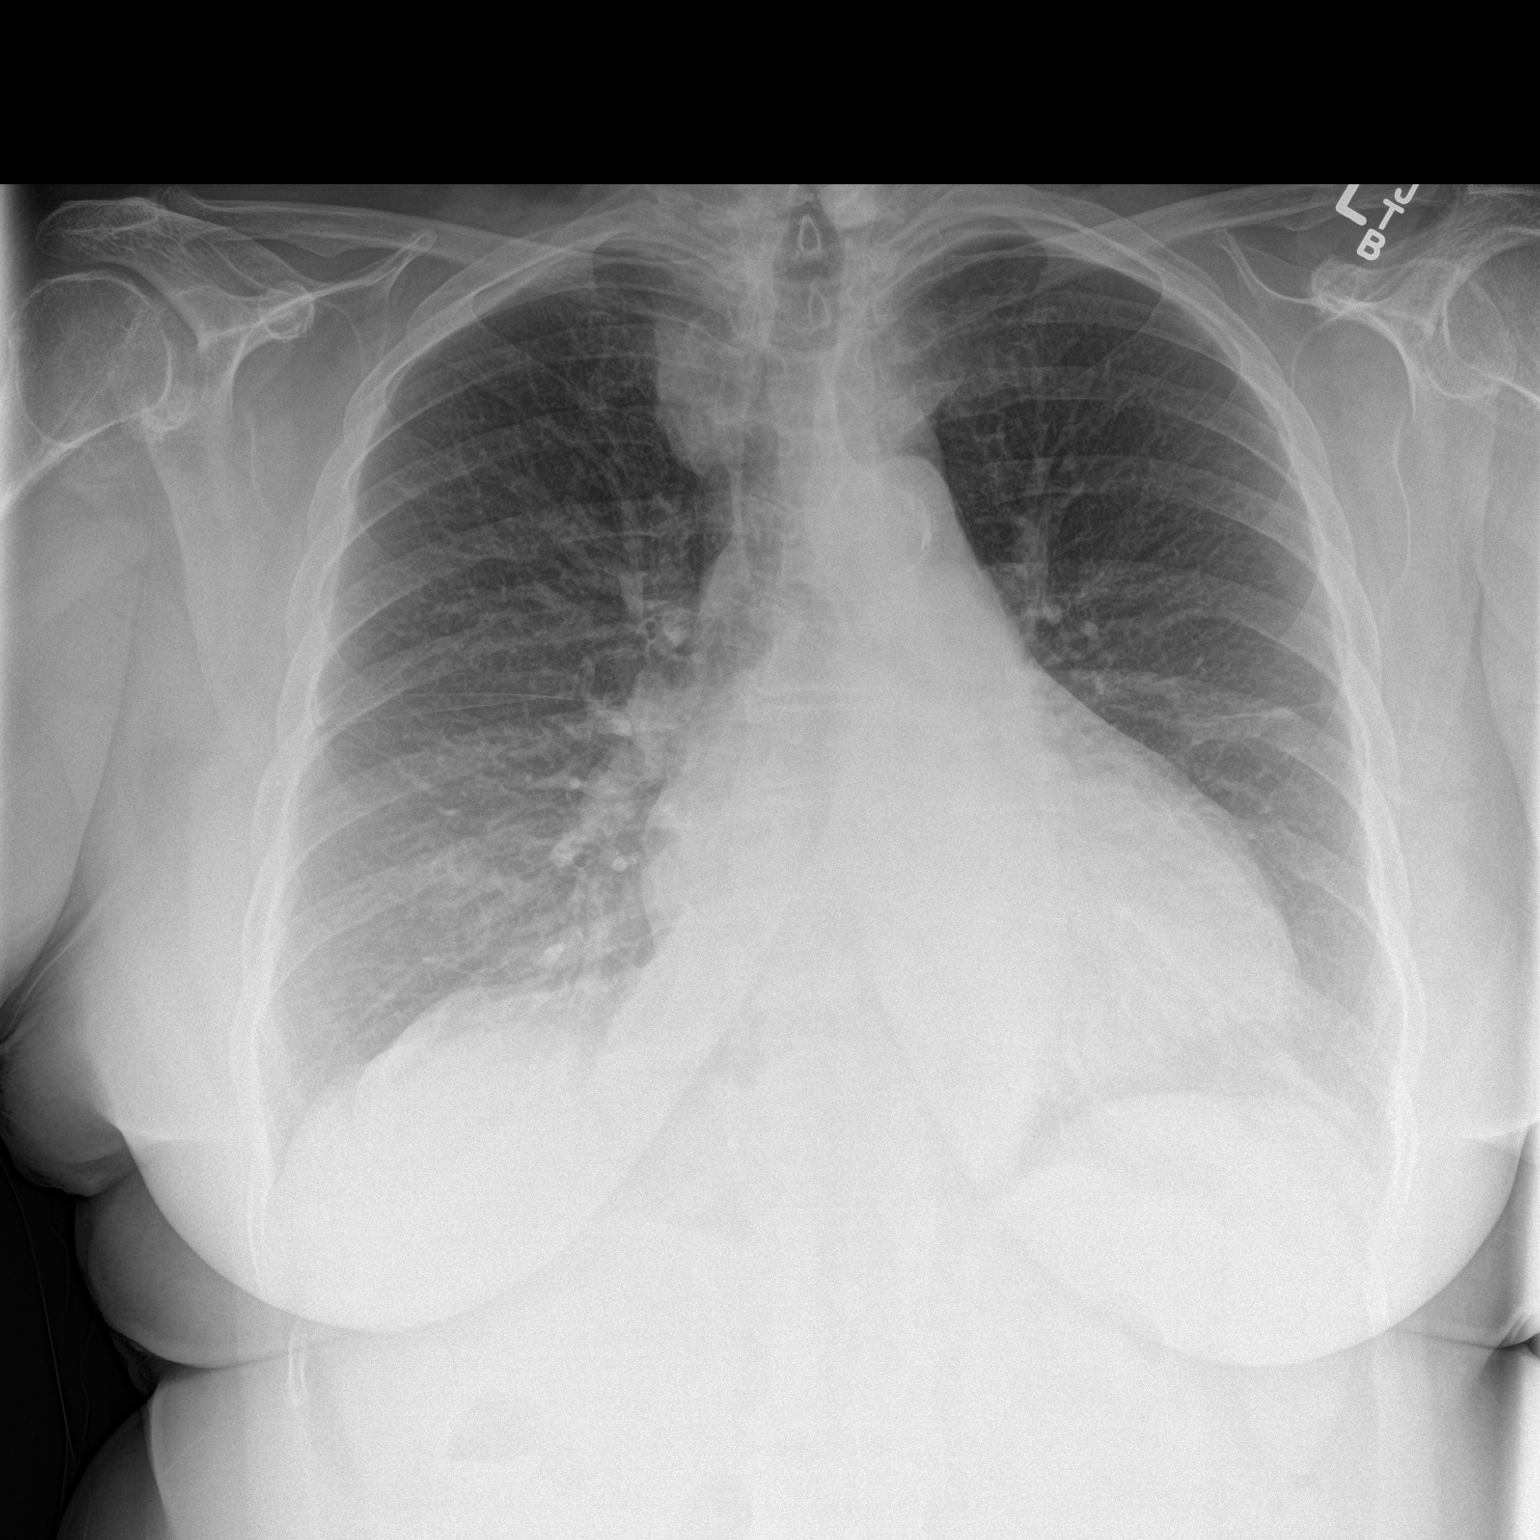

[chest lat]
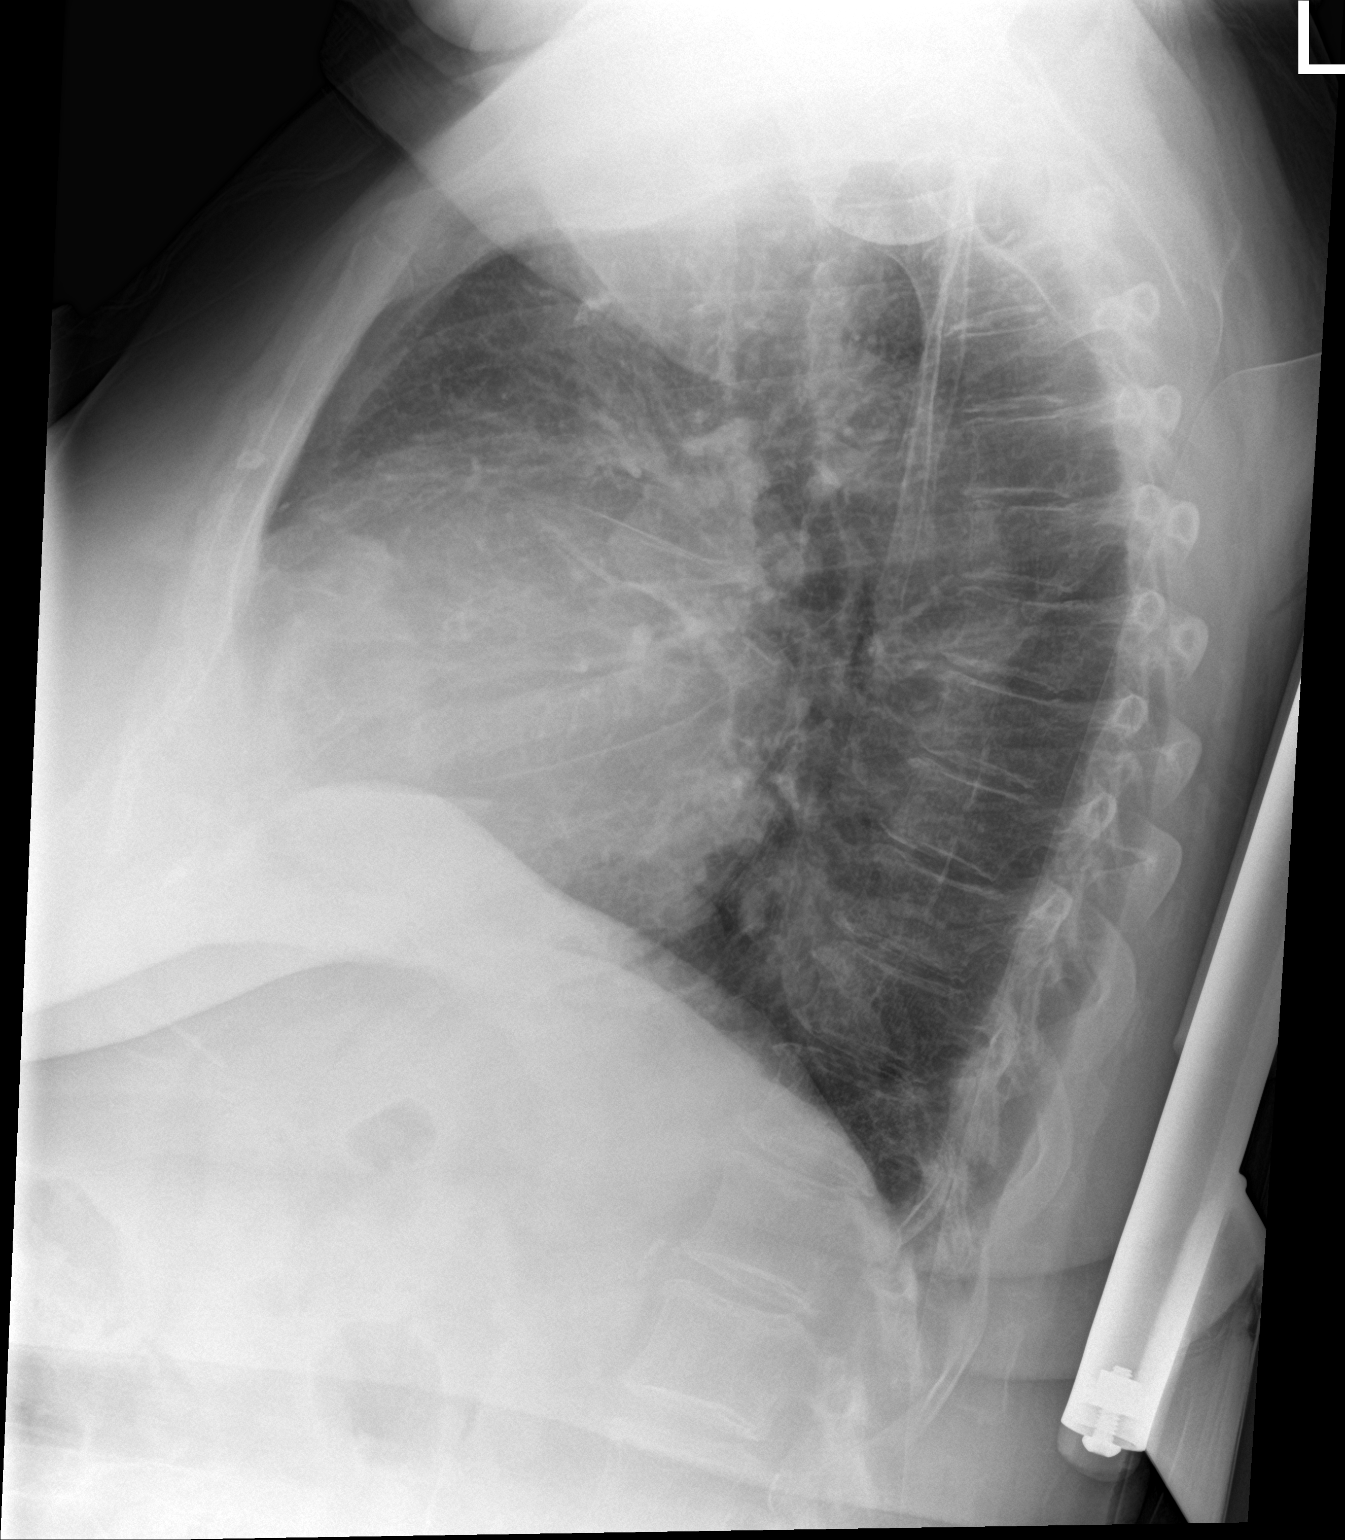

[2 of 2 positions shown; findings below may reference images not displayed]

FINDINGS: Chronic cardiac enlargement. Chronic aortic atherosclerosis and
tortuosity. Possible mild venous hypertension without evidence of
edema, infiltrate, collapse or effusion. No significant bone
finding.
IMPRESSION: Chronic cardiomegaly and aortic atherosclerosis. Venous hypertension
without frank edema.

## 2020-01-03 DIAGNOSIS — Z79899 Other long term (current) drug therapy: Secondary | ICD-10-CM | POA: Diagnosis not present

## 2020-01-03 DIAGNOSIS — Z299 Encounter for prophylactic measures, unspecified: Secondary | ICD-10-CM | POA: Diagnosis not present

## 2020-01-03 DIAGNOSIS — E1165 Type 2 diabetes mellitus with hyperglycemia: Secondary | ICD-10-CM | POA: Diagnosis not present

## 2020-01-03 DIAGNOSIS — D649 Anemia, unspecified: Secondary | ICD-10-CM | POA: Diagnosis not present

## 2020-01-03 DIAGNOSIS — I10 Essential (primary) hypertension: Secondary | ICD-10-CM | POA: Diagnosis not present

## 2020-01-03 DIAGNOSIS — Z6836 Body mass index (BMI) 36.0-36.9, adult: Secondary | ICD-10-CM | POA: Diagnosis not present

## 2020-01-07 DIAGNOSIS — R519 Headache, unspecified: Secondary | ICD-10-CM | POA: Diagnosis not present

## 2020-01-07 DIAGNOSIS — D649 Anemia, unspecified: Secondary | ICD-10-CM | POA: Diagnosis not present

## 2020-01-07 DIAGNOSIS — R0602 Shortness of breath: Secondary | ICD-10-CM | POA: Diagnosis not present

## 2020-01-07 DIAGNOSIS — R531 Weakness: Secondary | ICD-10-CM | POA: Diagnosis not present

## 2020-01-08 DIAGNOSIS — R0602 Shortness of breath: Secondary | ICD-10-CM | POA: Diagnosis not present

## 2020-01-08 DIAGNOSIS — D649 Anemia, unspecified: Secondary | ICD-10-CM | POA: Diagnosis not present

## 2020-01-08 DIAGNOSIS — R531 Weakness: Secondary | ICD-10-CM | POA: Diagnosis not present

## 2020-01-08 DIAGNOSIS — R519 Headache, unspecified: Secondary | ICD-10-CM | POA: Diagnosis not present

## 2020-01-12 DIAGNOSIS — Z23 Encounter for immunization: Secondary | ICD-10-CM | POA: Diagnosis not present

## 2020-02-21 DIAGNOSIS — Z23 Encounter for immunization: Secondary | ICD-10-CM | POA: Diagnosis not present

## 2020-04-09 DIAGNOSIS — R5383 Other fatigue: Secondary | ICD-10-CM | POA: Diagnosis not present

## 2020-04-09 DIAGNOSIS — E1165 Type 2 diabetes mellitus with hyperglycemia: Secondary | ICD-10-CM | POA: Diagnosis not present

## 2020-04-09 DIAGNOSIS — I10 Essential (primary) hypertension: Secondary | ICD-10-CM | POA: Diagnosis not present

## 2020-04-09 DIAGNOSIS — J449 Chronic obstructive pulmonary disease, unspecified: Secondary | ICD-10-CM | POA: Diagnosis not present

## 2020-04-09 DIAGNOSIS — I272 Pulmonary hypertension, unspecified: Secondary | ICD-10-CM | POA: Diagnosis not present

## 2020-04-09 DIAGNOSIS — Z79899 Other long term (current) drug therapy: Secondary | ICD-10-CM | POA: Diagnosis not present

## 2020-04-09 DIAGNOSIS — Z6836 Body mass index (BMI) 36.0-36.9, adult: Secondary | ICD-10-CM | POA: Diagnosis not present

## 2020-04-09 DIAGNOSIS — Z299 Encounter for prophylactic measures, unspecified: Secondary | ICD-10-CM | POA: Diagnosis not present

## 2020-04-15 NOTE — Progress Notes (Signed)
Cardiology Office Note  Date: 04/16/2020   ID: Andrea [name], DOB [birth year], MRN [id number] At 160/80.  PCP:  Ignatius Specking, MD  Cardiologist:  Prentice Docker, MD Electrophysiologist:  None   Chief Complaint: Follow-up permanent atrial fibrillation and hypertension.  History of Present Illness: Andrea [name] is a 79 y.o. female with a history of permanent atrial fibrillation and hypertension.  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  She cannot take anticoagulation due to history of GI bleeding.  Last encounter with Dr. Purvis Sheffield 01/18/2019.  She denied any chest pain.  She was having chronic exertional dyspnea which he described as being slightly worse.  Being seen by her PCP for COPD.  Having occasional palpitations.  Not using oxygen regularly at night.  Previously diagnosed with sleep apnea but never used CPAP.  Had a normal cardiac catheterization 2016.  Having occasional daytime somnolence.  A referral was made to a sleep specialist.  She was to continue diltiazem and metoprolol for heart rate control.  Patient states he is having issues with shortness of breath and significant exertional fatigue.  She states she stopped both her Cardizem and metoprolol more than 6 months ago and stated the only thing that was helping her was her clonazepam.  She arrives today with an EKG showing atrial fibrillation with RVR rate of 115.  Her blood pressure is elevated at 160/80.  She has been to the atrial fibrillation clinic in the past and seen [name].  She states one of the reasons she stopped the medication was she felt terrible and she was having dizziness and some nausea occasionally.  She has iron deficiency anemia and receives intermittent iron infusions.  She is unable to take anticoagulants due to history of GI bleeding.  Past Medical History:  Diagnosis Date  . Anemia    secondary to acute on chronic GI bleed  . Anxiety disorder   . Diverticulosis   . Exertional dyspnea    Normal  LVF, 2-D echo, 09/2012  . Hiatal hernia   . Hypercholesterolemia   . Hypertension   . Obesity   . Osteoarthritis   . Tobacco abuse     Past Surgical History:  Procedure Laterality Date  . ABDOMINAL HYSTERECTOMY    . GIVENS CAPSULE STUDY N/A 02/19/2013   Procedure: GIVENS CAPSULE STUDY;  Surgeon: Malissa Hippo, MD;  Location: [facility];  Service: Endoscopy;  Laterality: N/A;  730    Current Outpatient Medications  Medication Sig Dispense Refill  . clonazePAM (KLONOPIN) 0.5 MG tablet Take 0.5 mg by mouth 2 (two) times daily as needed for anxiety.     . naproxen sodium (ALEVE) 220 MG tablet Take 220-440 mg by mouth daily as needed. For pain    . OXYGEN Inhale 3 L into the lungs at bedtime.    Marland Kitchen tetrahydrozoline-zinc (VISINE-AC) 0.05-0.25 % ophthalmic solution Place 2 drops into both eyes 3 (three) times daily as needed (Red Itchy Eyes).    Marland Kitchen BREO ELLIPTA 100-25 MCG/INH AEPB Inhale 1 puff into the lungs daily.     Marland Kitchen diltiazem (CARDIZEM CD) 360 MG 24 hr capsule Take 1 capsule (360 mg total) by mouth daily. (Patient not taking: Reported on 04/16/2020) 90 capsule 3  . metoprolol succinate (TOPROL XL) 25 MG 24 hr tablet Take 1 tablet (25 mg total) by mouth at bedtime. (Patient not taking: Reported on 04/16/2020) 90 tablet 3  . metoprolol succinate (TOPROL-XL) 50 MG 24 hr tablet Take 1 tablet (  50 mg total) by mouth every morning. (Patient not taking: Reported on 04/16/2020) 90 tablet 3   No current facility-administered medications for this visit.   Allergies:  Shellfish allergy   Social History: The patient  reports that she quit smoking about 7 years ago. Her smoking use included cigarettes. She has a 10.00 pack-year smoking history. She has never used smokeless tobacco. She reports that she does not drink alcohol or use drugs.   Family History: The patient's family history includes Hypertension in her brother.   ROS:  Please see the history of present illness. Otherwise, complete  review of systems is positive for none.  All other systems are reviewed and negative.   Physical Exam: VS:  BP (!) 160/80   Pulse 90   Ht 5\' 3"  (1.6 m)   Wt 223 lb (101.2 kg)   SpO2 98%   BMI 39.50 kg/m , BMI Body mass index is 39.5 kg/m.  Wt Readings from Last 3 Encounters:  04/16/20 223 lb (101.2 kg)  01/18/19 225 lb 3.2 oz (102.2 kg)  09/27/18 227 lb (103 kg)    General: Patient appears comfortable at rest. Neck: Supple, no elevated JVP or carotid bruits, no thyromegaly. Lungs: Clear to auscultation, nonlabored breathing at rest. Cardiac: Irregularly irregular tachycardic rate and rhythm, no S3 or significant systolic murmur, no pericardial rub. Extremities: No pitting edema, distal pulses 2+. Skin: Warm and dry. Musculoskeletal: No kyphosis. Neuropsychiatric: Alert and oriented x3, affect grossly appropriate.  ECG:  An ECG dated 04/16/2020 was personally reviewed today and demonstrated:  Atrial fibrillation with RVR rate of 115.  Recent Labwork: No results found for requested labs within last 8760 hours.  No results found for: CHOL, TRIG, HDL, CHOLHDL, VLDL, LDLCALC, LDLDIRECT  Other Studies Reviewed Today:  Echocardiogram 11/6/201  Conclusions   - Left ventricle: Average global longitudinal LV strain is normal  at -21.7. The cavity size was normal. There was mild concentric  hypertrophy. Systolic function was normal. The estimated ejection  fraction was in the range of 60% to 65%. Wall motion was normal;  there were no regional wall motion abnormalities. The study was  not technically sufficient to allow evaluation of LV diastolic  dysfunction due to atrial fibrillation.  - Aortic valve: Trileaflet; normal thickness, mildly calcified  leaflets. There was trivial regurgitation.  - Mitral valve: Calcified annulus. There was mild regurgitation.  - Left atrium: The atrium was moderately dilated.  - Right atrium: The atrium was severely dilated.  -  Tricuspid valve: There was mild regurgitation.  - Pulmonic valve: There was trivial regurgitation.  - Pulmonary arteries: PA peak pressure: 43 mm Hg (S).   Impressions:   - The right ventricular systolic pressure was increased consistent  with moderate pulmonary hypertension.   Assessment and Plan:  1. Permanent atrial fibrillation (HCC)   2. Essential hypertension   3. OSA (obstructive sleep apnea)   4. 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 Permanent atrial fibrillation (HCC) History of permanent atrial fibrillation.  Patient states she stopped taking her medications on her own greater than 6 months ago.  Stating the medications made her feel dizzy and nauseated.  Recently complaining of increasing dyspnea on exertion and significant exertional fatigue.  EKG today shows atrial fibrillation with RVR rate of 115.  Restart diltiazem 360 mg extended release daily.  Restart Toprol-XL 50 mg a.m., restart Toprol XL 25 mg p.m.  We need to repeat  an echocardiogram to see if extended length of atrial fibrillation with RVR may have affected LV function and patient is complaining of dyspnea on exertion.  2. Essential hypertension Blood pressure is elevated today at 160/80.  Likely the result of her stopping the Toprol and Cardizem.  We are restarting both medications as noted above  3. OSA (obstructive sleep apnea)/COPD History of OSA but not compliant with CPAP.  She is on home oxygen 3 L at bedtime for COPD  4. History of GI bleed History of GI bleeding.  Therefore not on anticoagulants.  States she receives intermittent iron infusions due to history of IDA.  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  We are repeating echocardiogram secondary to prolonged A. fib with RVR.  Medication Adjustments/Labs and Tests  Ordered: Current medicines are reviewed at length with the patient today.  Concerns regarding medicines are outlined above.   Disposition: Follow-up with Dr. Bronson Ing or APP 1 month  Signed, Levell July, NP 04/16/2020 2:25 PM    [facility] at [place], [place], [place] [address] Phone: [phone]; Fax: [phone]

## 2020-04-16 ENCOUNTER — Ambulatory Visit (INDEPENDENT_AMBULATORY_CARE_PROVIDER_SITE_OTHER): Payer: Medicare Other | Admitting: Family Medicine

## 2020-04-16 ENCOUNTER — Other Ambulatory Visit: Payer: Self-pay

## 2020-04-16 ENCOUNTER — Encounter: Payer: Self-pay | Admitting: Family Medicine

## 2020-04-16 VITALS — BP 160/80 | HR 90 | Ht 63.0 in | Wt 223.0 lb

## 2020-04-16 DIAGNOSIS — Z8719 Personal history of other diseases of the digestive system: Secondary | ICD-10-CM

## 2020-04-16 DIAGNOSIS — G4733 Obstructive sleep apnea (adult) (pediatric): Secondary | ICD-10-CM

## 2020-04-16 DIAGNOSIS — I4821 Permanent atrial fibrillation: Secondary | ICD-10-CM | POA: Diagnosis not present

## 2020-04-16 DIAGNOSIS — I2721 Secondary pulmonary arterial hypertension: Secondary | ICD-10-CM | POA: Diagnosis not present

## 2020-04-16 DIAGNOSIS — I10 Essential (primary) hypertension: Secondary | ICD-10-CM | POA: Diagnosis not present

## 2020-04-16 MED ORDER — METOPROLOL SUCCINATE ER 25 MG PO TB24: ORAL_TABLET | ORAL | 6 refills | Status: DC

## 2020-04-16 MED ORDER — DILTIAZEM HCL ER COATED BEADS 360 MG PO CP24: 360.0000 mg | ORAL_CAPSULE | Freq: Every day | ORAL | 3 refills | Status: DC

## 2020-04-16 NOTE — Patient Instructions (Addendum)
Medication Instructions:  Toprol XL Take 50 mg in the morning and 25 mg in the evening  Restart Diltiazem 360 mg Daily  Labwork: NONE   Testing/Procedures: Your physician has requested that you have an echocardiogram. Echocardiography is a painless test that uses sound waves to create images of your heart. It provides your doctor with information about the size and shape of your heart and how well your heart's chambers and valves are working. This procedure takes approximately one hour. There are no restrictions for this procedure.    Follow-Up: Your physician recommends that you schedule a follow-up appointment in: 1 Month    Any Other Special Instructions Will Be Listed Below (If Applicable).     If you need a refill on your cardiac medications before your next appointment, please call your pharmacy.  Thank you for choosing [facility]! '

## 2020-04-17 NOTE — Addendum Note (Signed)
Addended by: Siriah Treat T on: 04/17/2020 10:16 AM   Modules accepted: Orders  

## 2020-05-21 ENCOUNTER — Ambulatory Visit (INDEPENDENT_AMBULATORY_CARE_PROVIDER_SITE_OTHER): Payer: Medicare Other

## 2020-05-21 DIAGNOSIS — M159 Polyosteoarthritis, unspecified: Secondary | ICD-10-CM | POA: Diagnosis not present

## 2020-05-21 DIAGNOSIS — I4821 Permanent atrial fibrillation: Secondary | ICD-10-CM | POA: Diagnosis not present

## 2020-05-21 DIAGNOSIS — I10 Essential (primary) hypertension: Secondary | ICD-10-CM | POA: Diagnosis not present

## 2020-05-21 DIAGNOSIS — J449 Chronic obstructive pulmonary disease, unspecified: Secondary | ICD-10-CM | POA: Diagnosis not present

## 2020-05-21 DIAGNOSIS — I4891 Unspecified atrial fibrillation: Secondary | ICD-10-CM | POA: Diagnosis not present

## 2020-05-23 ENCOUNTER — Ambulatory Visit: Payer: Medicare Other | Admitting: Cardiovascular Disease

## 2020-05-27 ENCOUNTER — Telehealth: Payer: Self-pay | Admitting: *Deleted

## 2020-05-27 NOTE — Telephone Encounter (Signed)
-----   Message from Netta Neat., NP sent at 05/22/2020  4:49 PM EDT ----- Please call the patient and let her know the pumping function of her heart looks good.  The left ventricle is a little muscular.  The best treatment is to manage her blood pressure to keep the blood pressure below 130/80.  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  This is not uncommon with aging.  We will likely do a repeat echocardiogram in 1 year to recheck.  Thank you

## 2020-05-27 NOTE — Progress Notes (Signed)
Cardiology Office Note  Date: 05/27/2020   ID: Andrea [name], DOB May 29, 1941, MRN [id number] At 160/80.  PCP:  Ignatius Specking, MD  Cardiologist:  Prentice Docker, MD Electrophysiologist:  None   Chief Complaint: Follow-up permanent atrial fibrillation and hypertension.  History of Present Illness: Andrea [name] is a 79 y.o. female with a history of permanent atrial fibrillation and hypertension.  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  She cannot take anticoagulation due to history of GI bleeding.  Last encounter with Dr. Purvis Sheffield 01/18/2019.  She denied any chest pain.  She was having chronic exertional dyspnea which he described as being slightly worse.  Being seen by her PCP for COPD.  Having occasional palpitations.  Not using oxygen regularly at night.  Previously diagnosed with sleep apnea but never used CPAP.  Had a normal cardiac catheterization 2016.  Having occasional daytime somnolence.  A referral was made to a sleep specialist.  She was to continue diltiazem and metoprolol for heart rate control.  Last visit with me on 04/16/2020 stated she was having issues with shortness of breath and significant exertional fatigue.  She stated she stopped both her Cardizem and metoprolol more than 6 months ago and stated the only thing that was helping her was her clonazepam.  She arrived with an EKG showing atrial fibrillation with RVR rate of 115.  Her blood pressure was elevated at 160/80.  She had been to the atrial fibrillation clinic in the past and seen [name].  She stated one of the reasons she stopped the medication was she felt terrible and she was having dizziness and some nausea occasionally.  She has iron deficiency anemia and receives intermittent iron infusions.  She is unable to take anticoagulants due to history of GI bleeding.  She is here for follow-up today.  Her heart rate is much better controlled with a rate of 61.  States she still has issues with fatigue and dyspnea  on exertion.  She admits to excessive daytime sleepiness.  She has oxygen available at night but does not wear it.  She has not had follow-up regarding her sleep apnea.  She states she has the device at home but has never been shown how to use it.  Her blood pressure continues to be elevated on arrival today.  States she continues to have occasional palpitations without any associated presyncopal or syncopal episodes, lightheadedness, or dizziness.  Past Medical History:  Diagnosis Date  . Anemia    secondary to acute on chronic GI bleed  . Anxiety disorder   . Diverticulosis   . Exertional dyspnea    Normal LVF, 2-D echo, 09/2012  . Hiatal hernia   . Hypercholesterolemia   . Hypertension   . Obesity   . Osteoarthritis   . Tobacco abuse     Past Surgical History:  Procedure Laterality Date  . ABDOMINAL HYSTERECTOMY    . GIVENS CAPSULE STUDY N/A 02/19/2013   Procedure: GIVENS CAPSULE STUDY;  Surgeon: Malissa Hippo, MD;  [facility];  Service: Endoscopy;  Laterality: N/A;  730    Current Outpatient Medications  Medication Sig Dispense Refill  . BREO ELLIPTA 100-25 MCG/INH AEPB Inhale 1 puff into the lungs daily.     . clonazePAM (KLONOPIN) 0.5 MG tablet Take 0.5 mg by mouth 2 (two) times daily as needed for anxiety.     Marland Kitchen diltiazem (CARDIZEM CD) 360 MG 24 hr capsule Take 1 capsule (360 mg total) by  mouth daily. 90 capsule 3  . metoprolol succinate (TOPROL XL) 25 MG 24 hr tablet Take 50 mg in the morning and 25 mg at night 90 tablet 6  . naproxen sodium (ALEVE) 220 MG tablet Take 220-440 mg by mouth daily as needed. For pain    . OXYGEN Inhale 3 L into the lungs at bedtime.    Marland Kitchen tetrahydrozoline-zinc (VISINE-AC) 0.05-0.25 % ophthalmic solution Place 2 drops into both eyes 3 (three) times daily as needed (Red Itchy Eyes).     No current facility-administered medications for this visit.   Allergies:  Shellfish allergy   Social History: The patient  reports that she quit  smoking about 7 years ago. Her smoking use included cigarettes. She has a 10.00 pack-year smoking history. She has never used smokeless tobacco. She reports that she does not drink alcohol and does not use drugs.   Family History: The patient's family history includes Hypertension in her brother.   ROS:  Please see the history of present illness. Otherwise, complete review of systems is positive for none.  All other systems are reviewed and negative.   Physical Exam: VS:  There were no vitals taken for this visit., BMI There is no height or weight on file to calculate BMI.  Wt Readings from Last 3 Encounters:  04/16/20 223 lb (101.2 kg)  01/18/19 225 lb 3.2 oz (102.2 kg)  09/27/18 227 lb (103 kg)    General: Patient appears comfortable at rest. Neck: Supple, no elevated JVP or carotid bruits, no thyromegaly. Lungs: Clear to auscultation, nonlabored breathing at rest. Cardiac: Irregularly irregular rate and rhythm, no S3 or significant systolic murmur, no pericardial rub. Extremities: No pitting edema, distal pulses 2+. Skin: Warm and dry. Musculoskeletal: No kyphosis. Neuropsychiatric: Alert and oriented x3, affect grossly appropriate.  ECG:  An ECG dated 04/16/2020 was personally reviewed today and demonstrated:  Atrial fibrillation with RVR rate of 115.  Recent Labwork: No results found for requested labs within last 8760 hours.  No results found for: CHOL, TRIG, HDL, CHOLHDL, VLDL, LDLCALC, LDLDIRECT  Other Studies Reviewed Today:  Echocardiogram 05/21/2020   1. Left ventricular ejection fraction, by estimation, is 60 to 65%. The left ventricle has normal function. The left ventricle has no regional wall motion abnormalities. There is moderate left ventricular hypertrophy. Left ventricular diastolic parameters are indeterminate. 2. Right ventricular systolic function is normal. The right ventricular size is normal. There is normal pulmonary artery systolic pressure. 3. Left  atrial size was severely dilated. 4. Right atrial size was mildly dilated. 5. The mitral valve is normal in structure. Trivial mitral valve regurgitation. No evidence of mitral stenosis. 6. The aortic valve is tricuspid. Aortic valve regurgitation is mild. No aortic stenosis is present. 7. The inferior vena cava is normal in size with greater than 50% respiratory variability, suggesting right atrial pressure of 3 mmHg. Comparison(s): Echocardiogram done 09/27/18 showed an EF of 60-65%.  Assessment and Plan:   1. Permanent atrial fibrillation (HCC) History of permanent atrial fibrillation.  At last visit patient stated she stopped taking her medications on her own greater than 6 months ago.  Stating the medications made her feel dizzy and nauseated.  Recently complaining of increasing dyspnea on exertion and significant exertional fatigue.  EKG at last visit showed atrial fibrillation with RVR rate of 115.  She was restarted diltiazem 360 mg extended release daily and Toprol-XL 50 mg a.m., Toprol XL 25 mg p.m. Repeat echocardiogram 05/21/2020: EF 60-65% moderate LVH, severely  dilated left atrium, mildly dilated right atrium, trivial MR, mild AR. Due to heart rate being slow today I told her to decrease the a.m. dose of her Toprol back to 25 mg and keep the nighttime dose at 25 mg  2. Essential hypertension Blood pressure is elevated today 148/78.  Add lisinopril 10 mg daily to current regimen.  Advised patient to start taking her blood pressures every day for 2 weeks and come back for nursing visit after 2 weeks to have blood pressure check and to bring readings of her home blood pressures with her.    3. OSA (obstructive sleep apnea)/COPD History of OSA but not compliant with CPAP.  She is on home oxygen 3 L at bedtime for COPD.  We will attempt to have PCP follow-up on the logistics of getting the patient started on her CPAP.  She states she does have diagnosed sleep apnea and has the CPAP device  at home but has never been shown how to use it.  She admits to excessive fatigue and excessive daytime hypersomnolence.  4. History of GI bleed History of GI bleeding.  Therefore not on anticoagulants.  States she receives intermittent iron infusions due to history of IDA.  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  Recent echocardiogram showed normal PASP.  Medication Adjustments/Labs and Tests Ordered: Current medicines are reviewed at length with the patient today.  Concerns regarding medicines are outlined above.   Disposition: Follow-up with Dr. Purvis Sheffield or APP 3 months  Signed, Rennis Harding, NP 05/27/2020 11:51 PM    [facility] at [facility] [address] [place], [place], [place] [address] Phone: [phone]; Fax: [phone]

## 2020-05-28 ENCOUNTER — Encounter: Payer: Self-pay | Admitting: Family Medicine

## 2020-05-28 ENCOUNTER — Ambulatory Visit (INDEPENDENT_AMBULATORY_CARE_PROVIDER_SITE_OTHER): Payer: Medicare Other | Admitting: Family Medicine

## 2020-05-28 VITALS — BP 148/78 | HR 61 | Ht 63.0 in | Wt 223.8 lb

## 2020-05-28 DIAGNOSIS — G4733 Obstructive sleep apnea (adult) (pediatric): Secondary | ICD-10-CM

## 2020-05-28 DIAGNOSIS — I4821 Permanent atrial fibrillation: Secondary | ICD-10-CM

## 2020-05-28 DIAGNOSIS — Z8719 Personal history of other diseases of the digestive system: Secondary | ICD-10-CM

## 2020-05-28 DIAGNOSIS — I272 Pulmonary hypertension, unspecified: Secondary | ICD-10-CM

## 2020-05-28 DIAGNOSIS — I10 Essential (primary) hypertension: Secondary | ICD-10-CM

## 2020-05-28 MED ORDER — METOPROLOL SUCCINATE ER 25 MG PO TB24: ORAL_TABLET | ORAL | 1 refills | Status: DC

## 2020-05-28 MED ORDER — LISINOPRIL 10 MG PO TABS: 10.0000 mg | ORAL_TABLET | Freq: Every day | ORAL | 1 refills | Status: DC

## 2020-05-28 NOTE — Telephone Encounter (Signed)
Patient informed during office visit today. Copy sent to PCP °

## 2020-05-28 NOTE — Patient Instructions (Addendum)
Your physician recommends that you schedule a follow-up appointment in: 3 MONTHS AND 2 WEEKS FOR BLOOD PRESSURE CHECK WITH NURSE  Your physician has recommended you make the following change in your medication:   CHANGE TOPROL XL 25 MG TWICE DAILY   START LISINOPRIL 10 MG DAILY   Thank you for choosing [facility]!!

## 2020-06-11 ENCOUNTER — Ambulatory Visit: Payer: Medicare Other

## 2020-07-15 DIAGNOSIS — E1165 Type 2 diabetes mellitus with hyperglycemia: Secondary | ICD-10-CM | POA: Diagnosis not present

## 2020-07-15 DIAGNOSIS — R5383 Other fatigue: Secondary | ICD-10-CM | POA: Diagnosis not present

## 2020-07-15 DIAGNOSIS — I4891 Unspecified atrial fibrillation: Secondary | ICD-10-CM | POA: Diagnosis not present

## 2020-07-15 DIAGNOSIS — Z299 Encounter for prophylactic measures, unspecified: Secondary | ICD-10-CM | POA: Diagnosis not present

## 2020-07-15 DIAGNOSIS — I10 Essential (primary) hypertension: Secondary | ICD-10-CM | POA: Diagnosis not present

## 2020-07-15 DIAGNOSIS — J449 Chronic obstructive pulmonary disease, unspecified: Secondary | ICD-10-CM | POA: Diagnosis not present

## 2020-07-15 DIAGNOSIS — Z79899 Other long term (current) drug therapy: Secondary | ICD-10-CM | POA: Diagnosis not present

## 2020-08-22 ENCOUNTER — Ambulatory Visit (INDEPENDENT_AMBULATORY_CARE_PROVIDER_SITE_OTHER): Payer: Medicare Other | Admitting: Family Medicine

## 2020-08-22 ENCOUNTER — Encounter: Payer: Self-pay | Admitting: Family Medicine

## 2020-08-22 VITALS — BP 180/100 | HR 82 | Ht 63.0 in | Wt 229.6 lb

## 2020-08-22 DIAGNOSIS — I4821 Permanent atrial fibrillation: Secondary | ICD-10-CM

## 2020-08-22 DIAGNOSIS — Z79899 Other long term (current) drug therapy: Secondary | ICD-10-CM | POA: Diagnosis not present

## 2020-08-22 DIAGNOSIS — Z23 Encounter for immunization: Secondary | ICD-10-CM | POA: Diagnosis not present

## 2020-08-22 DIAGNOSIS — I10 Essential (primary) hypertension: Secondary | ICD-10-CM

## 2020-08-22 MED ORDER — LISINOPRIL 20 MG PO TABS: 20.0000 mg | ORAL_TABLET | Freq: Every day | ORAL | 3 refills | Status: DC

## 2020-08-22 NOTE — Progress Notes (Signed)
Cardiology Office Note  Date: 08/22/2020   ID: Andrea [name], DOB [birth year], MRN [id number] At 160/80.  PCP:  Andrea Specking, MD  Cardiologist:  No primary care provider on file. Electrophysiologist:  None   Chief Complaint: Follow-up permanent atrial fibrillation and hypertension.  History of Present Illness: Andrea [name] is a 79 y.o. female with a history of permanent atrial fibrillation and hypertension.  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  She cannot take anticoagulation due to history of GI bleeding.  Last encounter with Dr. Purvis Sheffield 01/18/2019.  She denied any chest pain.  She was having chronic exertional dyspnea which he described as being slightly worse.  Being seen by her PCP for COPD.  Having occasional palpitations.  Not using oxygen regularly at night.  Previously diagnosed with sleep apnea but never used CPAP.  Had a normal cardiac catheterization 2016.  Having occasional daytime somnolence.  A referral was made to a sleep specialist.  She was to continue diltiazem and metoprolol for heart rate control.  Last visit with me on 04/16/2020 stated she was having issues with shortness of breath and significant exertional fatigue.  She stated she stopped both her Cardizem and metoprolol more than 6 months ago and stated the only thing that was helping her was her clonazepam.  She arrived with an EKG showing atrial fibrillation with RVR rate of 115.  Her blood pressure was elevated at 160/80.  She had been to the atrial fibrillation clinic in the past and seen [name].  She stated one of the reasons she stopped the medication was she felt terrible and she was having dizziness and some nausea occasionally.  She has iron deficiency anemia and receives intermittent iron infusions.  She is unable to take anticoagulants due to history of GI bleeding.  Last follow-up with me on 05/28/2020.  Her heart rate was much better controlled with a rate of 61.  Stated she still had issues with  fatigue and dyspnea on exertion.  She admitted to excessive daytime sleepiness.  She has oxygen available at night but does not wear it.  She had not had follow-up regarding her sleep apnea.  She stated she had the device at home but has never been shown how to use it.  Her blood pressure continues to be elevated on arrival today.  Stated she continued to have occasional palpitations without any associated presyncopal or syncopal episodes, lightheadedness, or dizziness.   She presents today initially stating she was only taking Klonopin for her palpitations.  We had previously prescribed Toprol in addition to Cardizem for heart rate control.  Later we did reduce the dose of Toprol due to slow heart rate.  We added lisinopril 10 at a previous visit for better blood pressure control.  She had not filled either medication she had not filled her Toprol or her lisinopril due to cost.  She had not followed up with her PCP regarding need for CPAP therapy.  She stated she had the machine but was never instructed as how to use it.  It appears there may be some cognitive issues as well as knowledge deficit regarding why she is taking medications and the reason she needs CPAP for her sleep apnea.  Past Medical History:  Diagnosis Date  . Anemia    secondary to acute on chronic GI bleed  . Anxiety disorder   . Diverticulosis   . Exertional dyspnea    Normal LVF, 2-D echo, 09/2012  . Hiatal  hernia   . Hypercholesterolemia   . Hypertension   . Obesity   . Osteoarthritis   . Tobacco abuse     Past Surgical History:  Procedure Laterality Date  . ABDOMINAL HYSTERECTOMY    . GIVENS CAPSULE STUDY N/A 02/19/2013   Procedure: GIVENS CAPSULE STUDY;  Surgeon: Andrea Hippo, MD;  [facility];  Service: Endoscopy;  Laterality: N/A;  730    Current Outpatient Medications  Medication Sig Dispense Refill  . BREO ELLIPTA 100-25 MCG/INH AEPB Inhale 1 puff into the lungs daily.     . clonazePAM  (KLONOPIN) 0.5 MG tablet Take 0.5 mg by mouth 2 (two) times daily as needed for anxiety.     Marland Kitchen diltiazem (CARDIZEM CD) 360 MG 24 hr capsule Take 1 capsule (360 mg total) by mouth daily. 90 capsule 3  . naproxen sodium (ALEVE) 220 MG tablet Take 220-440 mg by mouth daily as needed. For pain    . OXYGEN Inhale 3 L into the lungs at bedtime.    Marland Kitchen tetrahydrozoline-zinc (VISINE-AC) 0.05-0.25 % ophthalmic solution Place 2 drops into both eyes 3 (three) times daily as needed (Red Itchy Eyes).    Marland Kitchen lisinopril (ZESTRIL) 10 MG tablet Take 1 tablet (10 mg total) by mouth daily. (Patient not taking: Reported on 08/22/2020) 90 tablet 1  . metoprolol succinate (TOPROL XL) 25 MG 24 hr tablet Take 25 mg in the morning and 25 mg at night (Patient not taking: Reported on 08/22/2020) 180 tablet 1   No current facility-administered medications for this visit.   Allergies:  Shellfish allergy   Social History: The patient  reports that she quit smoking about 8 years ago. Her smoking use included cigarettes. She has a 10.00 pack-year smoking history. She has never used smokeless tobacco. She reports that she does not drink alcohol and does not use drugs.   Family History: The patient's family history includes Hypertension in her brother.   ROS:  Please see the history of present illness. Otherwise, complete review of systems is positive for none.  All other systems are reviewed and negative.   Physical Exam: VS:  BP (!) 180/100   Pulse 82   Ht 5\' 3"  (1.6 m)   Wt 229 lb 9.6 oz (104.1 kg)   SpO2 98%   BMI 40.67 kg/m , BMI Body mass index is 40.67 kg/m.  Wt Readings from Last 3 Encounters:  08/22/20 229 lb 9.6 oz (104.1 kg)  05/28/20 223 lb 12.8 oz (101.5 kg)  04/16/20 223 lb (101.2 kg)    General: Obese patient appears comfortable at rest. Neck: Supple, no elevated JVP or carotid bruits, no thyromegaly. Lungs: Clear to auscultation, nonlabored breathing at rest. Cardiac: Irregularly irregular rate and  rhythm, no S3 or significant systolic murmur, no pericardial rub. Extremities: No pitting edema, distal pulses 2+. Skin: Warm and dry. Musculoskeletal: No kyphosis. Neuropsychiatric: Alert and oriented x3, affect grossly appropriate.  ECG:  An ECG dated 04/16/2020 was personally reviewed today and demonstrated:  Atrial fibrillation with RVR rate of 115.  Recent Labwork: No results found for requested labs within last 8760 hours.  No results found for: CHOL, TRIG, HDL, CHOLHDL, VLDL, LDLCALC, LDLDIRECT  Other Studies Reviewed Today:  Echocardiogram 05/21/2020   1. Left ventricular ejection fraction, by estimation, is 60 to 65%. The left ventricle has normal function. The left ventricle has no regional wall motion abnormalities. There is moderate left ventricular hypertrophy. Left ventricular diastolic parameters are indeterminate. 2. Right ventricular  systolic function is normal. The right ventricular size is normal. There is normal pulmonary artery systolic pressure. 3. Left atrial size was severely dilated. 4. Right atrial size was mildly dilated. 5. The mitral valve is normal in structure. Trivial mitral valve regurgitation. No evidence of mitral stenosis. 6. The aortic valve is tricuspid. Aortic valve regurgitation is mild. No aortic stenosis is present. 7. The inferior vena cava is normal in size with greater than 50% respiratory variability, suggesting right atrial pressure of 3 mmHg. Comparison(s): Echocardiogram done 09/27/18 showed an EF of 60-65%.  Assessment and Plan:   1. Permanent atrial fibrillation (HCC) History of permanent atrial fibrillation.  At a previous visit patient stated she stopped taking her medications on her own greater than 6 months ago stating the medications made her feel dizzy and nauseated.  We restarted her diltiazem 360 mg daily at previous visit.  Her heart rate today is 82 with rate control.  She states when she gets up to form any activity she can  feel her heart rate racing.  She is not on anticoagulants or aspirin due to history of GI bleed.  2. Essential hypertension Blood pressure is elevated today 148/78.  Start lisinopril 20 mg daily come back in 2 weeks for blood pressure check.  We will get a BMP and magnesium at that time.  3. OSA (obstructive sleep apnea)/COPD History of OSA but not compliant with CPAP.  She is on home oxygen 3 L at bedtime for COPD.  We will attempt to have PCP follow-up on the logistics of getting the patient started on her CPAP.  She states she does have diagnosed sleep apnea and has the CPAP device at home but has never been shown how to use it.  She continues to complain of excessive fatigue and excessive daytime hypersomnolence. Again today we discussed the fact that she is not using her CPAP.  We are calling PCP office to make sure someone is able to come to her house and show her how to use the CPAP machine.  This is likely exacerbating her atrial fibrillation and explains her significant fatigue and activity intolerance along with deconditioning and morbid obesity.  4. History of GI bleed History of GI bleeding.  Therefore not on anticoagulants.  States she receives intermittent iron infusions due to history of IDA.  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  Recent echocardiogram showed normal PASP.  Medication Adjustments/Labs and Tests Ordered: Current medicines are reviewed at length with the patient today.  Concerns regarding medicines are outlined above.   Disposition: Follow-up with Dr. Diona Browner or APP 3 months signed, Rennis Harding, NP 08/22/2020 2:51 PM    [facility] at [facility] [address] [place], [place], [place] [address] Phone: [phone]; Fax: [phone]

## 2020-08-22 NOTE — Patient Instructions (Signed)
Medication Instructions:   Your physician has recommended you make the following change in your medication:   Start lisinopril 20 mg daily-new prescription sent $4 at [facility]  Pick up diltiazem 360 mg daily prescription at [facility]  Continue other medications the same  Labwork:  Your physician recommends that you return for non-fasting lab work in: 2 weeks to check your BMET & Mg. This may be done at [facility] from 8:00 am - 4:00 pm. No appointment is needed.  Testing/Procedures:  None  Follow-Up:  Your physician recommends that you schedule a follow-up appointment in: 3 months.  Your physician recommends that you schedule a follow-up appointment in: 2 week for a nurse visit to recheck your blood pressure.  Any Other Special Instructions Will Be Listed Below (If Applicable).  If you need a refill on your cardiac medications before your next appointment, please call your pharmacy.

## 2020-08-25 ENCOUNTER — Telehealth: Payer: Self-pay | Admitting: Family Medicine

## 2020-08-25 MED ORDER — LISINOPRIL 20 MG PO TABS: 20.0000 mg | ORAL_TABLET | Freq: Every day | ORAL | 3 refills | Status: DC

## 2020-08-25 NOTE — Telephone Encounter (Signed)
Patient called stating that her pharmacy [facility] in [place] does not have the lisinopril (ZESTRIL) 20 MG tablet [[id number]]  In and it will be a while before they have it in stock  [facility] [place] [facility] [address]     Also wanted to let Isabelle Course know that she received her oxygen [facility] in [place] - [address] -  Telephone # [phone]

## 2020-08-25 NOTE — Addendum Note (Signed)
Addended by: Burman Nieves T on: 08/25/2020 02:33 PM   Modules accepted: Orders

## 2020-08-25 NOTE — Telephone Encounter (Signed)
Medication sent to pharmacy  

## 2020-08-25 NOTE — Telephone Encounter (Signed)
Spoke with Misty Stanley at [facility] to advise that patient reported to [place] that she did not know how to use her CPAP, therefore, has not been compliant with it. Per Misty Stanley, patient gets oxygen since 2016 from their company but not CPAP.

## 2020-08-26 ENCOUNTER — Telehealth: Payer: Self-pay | Admitting: Licensed Clinical Social Worker

## 2020-08-26 NOTE — Telephone Encounter (Signed)
CSW referred to assist patient with obtaining a BP cuff. CSW contacted patient to inform cuff will be delivered to home. Patient grateful for support and assistance. CSW available as needed. Jackie Verity Gilcrest, LCSW, CCSW-MCS [phone]  

## 2020-08-26 NOTE — Telephone Encounter (Signed)
Advised that [facility] [address] [facility] [place] on yesterday Patient clarified that she does not have a CPAP machine at home. Reports having one several years ago and was unable to use it correctly so she sent the machine back.

## 2020-09-05 ENCOUNTER — Ambulatory Visit: Payer: Medicare Other

## 2020-09-10 ENCOUNTER — Ambulatory Visit: Payer: Medicare Other

## 2020-09-12 ENCOUNTER — Ambulatory Visit: Payer: Medicare Other | Admitting: *Deleted

## 2020-09-12 VITALS — BP 144/78 | HR 81

## 2020-09-12 DIAGNOSIS — I10 Essential (primary) hypertension: Secondary | ICD-10-CM

## 2020-09-12 NOTE — Progress Notes (Signed)
It is very hard to assess the effect of blood pressure medications if she has not taken them.  Since we do not have a log of her blood pressures  it is doubly difficult to reassess.  Have the patient start rechecking her blood pressures again and make sure she follows up in 2 weeks and bring her log of blood pressures with her and make sure she has taken her a.m. medications at least 2 hours before arrival.  Thank you

## 2020-09-12 NOTE — Progress Notes (Signed)
Patient in office this morning for BP check.  She has not taken her am meds yet.    Did not bring in log of BP readings.

## 2020-09-12 NOTE — Progress Notes (Signed)
Patient here for BP check this morning.

## 2020-09-15 NOTE — Progress Notes (Signed)
Left message to return call 

## 2020-09-17 NOTE — Progress Notes (Signed)
Patient notified.  She will keep log of her BP readings over the next 2 weeks & return log to office for provider review.  Will not do nurse visit as it is difficult for her to get in & out of the office.  Reminded her to take her medications earlier in the day & wait 2 hours after to take blood pressure readings.  She verbalized understanding.

## 2020-09-19 DIAGNOSIS — I10 Essential (primary) hypertension: Secondary | ICD-10-CM | POA: Diagnosis not present

## 2020-09-19 DIAGNOSIS — J449 Chronic obstructive pulmonary disease, unspecified: Secondary | ICD-10-CM | POA: Diagnosis not present

## 2020-09-19 DIAGNOSIS — I4891 Unspecified atrial fibrillation: Secondary | ICD-10-CM | POA: Diagnosis not present

## 2020-09-19 DIAGNOSIS — M159 Polyosteoarthritis, unspecified: Secondary | ICD-10-CM | POA: Diagnosis not present

## 2020-09-26 DIAGNOSIS — I4891 Unspecified atrial fibrillation: Secondary | ICD-10-CM | POA: Diagnosis not present

## 2020-09-26 DIAGNOSIS — I10 Essential (primary) hypertension: Secondary | ICD-10-CM | POA: Diagnosis not present

## 2020-09-26 DIAGNOSIS — Z1331 Encounter for screening for depression: Secondary | ICD-10-CM | POA: Diagnosis not present

## 2020-09-26 DIAGNOSIS — Z7189 Other specified counseling: Secondary | ICD-10-CM | POA: Diagnosis not present

## 2020-09-26 DIAGNOSIS — Z0000 Encounter for general adult medical examination without abnormal findings: Secondary | ICD-10-CM | POA: Diagnosis not present

## 2020-09-26 DIAGNOSIS — Z1339 Encounter for screening examination for other mental health and behavioral disorders: Secondary | ICD-10-CM | POA: Diagnosis not present

## 2020-09-26 DIAGNOSIS — Z299 Encounter for prophylactic measures, unspecified: Secondary | ICD-10-CM | POA: Diagnosis not present

## 2020-09-26 DIAGNOSIS — Z6837 Body mass index (BMI) 37.0-37.9, adult: Secondary | ICD-10-CM | POA: Diagnosis not present

## 2020-09-26 DIAGNOSIS — E1165 Type 2 diabetes mellitus with hyperglycemia: Secondary | ICD-10-CM | POA: Diagnosis not present

## 2020-10-19 ENCOUNTER — Emergency Department (HOSPITAL_COMMUNITY): Payer: Medicare Other

## 2020-10-19 ENCOUNTER — Inpatient Hospital Stay (HOSPITAL_COMMUNITY)
Admission: EM | Admit: 2020-10-19 | Discharge: 2020-10-24 | DRG: 191 | Disposition: A | Discharge status: Skilled Nursing Facility | Payer: Medicare Other | Attending: Internal Medicine | Admitting: Internal Medicine

## 2020-10-19 DIAGNOSIS — Z6841 Body Mass Index (BMI) 40.0 and over, adult: Secondary | ICD-10-CM

## 2020-10-19 DIAGNOSIS — J189 Pneumonia, unspecified organism: Secondary | ICD-10-CM | POA: Diagnosis not present

## 2020-10-19 DIAGNOSIS — Z7951 Long term (current) use of inhaled steroids: Secondary | ICD-10-CM

## 2020-10-19 DIAGNOSIS — T380X5A Adverse effect of glucocorticoids and synthetic analogues, initial encounter: Secondary | ICD-10-CM | POA: Diagnosis present

## 2020-10-19 DIAGNOSIS — Z79899 Other long term (current) drug therapy: Secondary | ICD-10-CM

## 2020-10-19 DIAGNOSIS — R7303 Prediabetes: Secondary | ICD-10-CM | POA: Diagnosis present

## 2020-10-19 DIAGNOSIS — E785 Hyperlipidemia, unspecified: Secondary | ICD-10-CM | POA: Diagnosis present

## 2020-10-19 DIAGNOSIS — R059 Cough, unspecified: Secondary | ICD-10-CM | POA: Diagnosis not present

## 2020-10-19 DIAGNOSIS — R531 Weakness: Secondary | ICD-10-CM | POA: Diagnosis not present

## 2020-10-19 DIAGNOSIS — I10 Essential (primary) hypertension: Secondary | ICD-10-CM

## 2020-10-19 DIAGNOSIS — D500 Iron deficiency anemia secondary to blood loss (chronic): Secondary | ICD-10-CM | POA: Diagnosis present

## 2020-10-19 DIAGNOSIS — Z9981 Dependence on supplemental oxygen: Secondary | ICD-10-CM

## 2020-10-19 DIAGNOSIS — Z91013 Allergy to seafood: Secondary | ICD-10-CM

## 2020-10-19 DIAGNOSIS — F39 Unspecified mood [affective] disorder: Secondary | ICD-10-CM | POA: Diagnosis present

## 2020-10-19 DIAGNOSIS — I110 Hypertensive heart disease with heart failure: Secondary | ICD-10-CM | POA: Diagnosis present

## 2020-10-19 DIAGNOSIS — Z20822 Contact with and (suspected) exposure to covid-19: Secondary | ICD-10-CM | POA: Diagnosis present

## 2020-10-19 DIAGNOSIS — I4891 Unspecified atrial fibrillation: Secondary | ICD-10-CM | POA: Diagnosis not present

## 2020-10-19 DIAGNOSIS — E7800 Pure hypercholesterolemia, unspecified: Secondary | ICD-10-CM | POA: Diagnosis present

## 2020-10-19 DIAGNOSIS — J9811 Atelectasis: Secondary | ICD-10-CM | POA: Diagnosis not present

## 2020-10-19 DIAGNOSIS — E876 Hypokalemia: Secondary | ICD-10-CM | POA: Diagnosis present

## 2020-10-19 DIAGNOSIS — Z87891 Personal history of nicotine dependence: Secondary | ICD-10-CM

## 2020-10-19 DIAGNOSIS — J441 Chronic obstructive pulmonary disease with (acute) exacerbation: Secondary | ICD-10-CM | POA: Diagnosis not present

## 2020-10-19 DIAGNOSIS — D72828 Other elevated white blood cell count: Secondary | ICD-10-CM | POA: Diagnosis present

## 2020-10-19 DIAGNOSIS — J9611 Chronic respiratory failure with hypoxia: Secondary | ICD-10-CM | POA: Diagnosis not present

## 2020-10-19 DIAGNOSIS — I2721 Secondary pulmonary arterial hypertension: Secondary | ICD-10-CM | POA: Diagnosis present

## 2020-10-19 DIAGNOSIS — E662 Morbid (severe) obesity with alveolar hypoventilation: Secondary | ICD-10-CM | POA: Diagnosis not present

## 2020-10-19 DIAGNOSIS — R0602 Shortness of breath: Secondary | ICD-10-CM | POA: Diagnosis not present

## 2020-10-19 DIAGNOSIS — I4821 Permanent atrial fibrillation: Secondary | ICD-10-CM

## 2020-10-19 DIAGNOSIS — E1165 Type 2 diabetes mellitus with hyperglycemia: Secondary | ICD-10-CM | POA: Diagnosis present

## 2020-10-19 DIAGNOSIS — Z23 Encounter for immunization: Secondary | ICD-10-CM

## 2020-10-19 HISTORY — DX: Unspecified atrial fibrillation: I48.91

## 2020-10-19 NOTE — ED Provider Notes (Signed)
TIME SEEN: 11:58 PM  CHIEF COMPLAINT: Cough, shortness of breath  HPI: Patient is an 79 year old female with history of hypertension, hyperlipidemia, paroxysmal A. fib who presents to the emergency department with complaints of worsening shortness of breath.  States she has had shortness of breath for months but it worsened today.  Has had dry cough.  No fevers or chills.  Does have some central chest tightness.  Denies nausea, vomiting or diarrhea.  No loss of taste or smell.  Has received 3 COVID-19 vaccinations.  Is not on anticoagulation.  She is unsure why but it appears per her chart she has a history of chronic GI bleed.  Echo 05/21/2020:  IMPRESSIONS    1. Left ventricular ejection fraction, by estimation, is 60 to 65%. The  left ventricle has normal function. The left ventricle has no regional  wall motion abnormalities. There is moderate left ventricular hypertrophy.  Left ventricular diastolic  parameters are indeterminate.  2. Right ventricular systolic function is normal. The right ventricular  size is normal. There is normal pulmonary artery systolic pressure.  3. Left atrial size was severely dilated.  4. Right atrial size was mildly dilated.  5. The mitral valve is normal in structure. Trivial mitral valve  regurgitation. No evidence of mitral stenosis.  6. The aortic valve is tricuspid. Aortic valve regurgitation is mild. No  aortic stenosis is present.  7. The inferior vena cava is normal in size with greater than 50%  respiratory variability, suggesting right atrial pressure of 3 mmHg.   ROS: See HPI Constitutional: no fever  Eyes: no drainage  ENT: no runny nose   Cardiovascular:   chest pain  Resp:  SOB  GI: no vomiting GU: no dysuria Integumentary: no rash  Allergy: no hives  Musculoskeletal: no leg swelling  Neurological: no slurred speech ROS otherwise negative  PAST MEDICAL HISTORY/PAST SURGICAL HISTORY:  Past Medical History:  Diagnosis Date   . Anemia    secondary to acute on chronic GI bleed  . Anxiety disorder   . Diverticulosis   . Exertional dyspnea    Normal LVF, 2-D echo, 09/2012  . Hiatal hernia   . Hypercholesterolemia   . Hypertension   . Obesity   . Osteoarthritis   . Tobacco abuse     MEDICATIONS:  Prior to Admission medications   Medication Sig Start Date End Date Taking? Authorizing Provider  BREO ELLIPTA 100-25 MCG/INH AEPB Inhale 1 puff into the lungs daily.  07/12/16   [provider]  clonazePAM (KLONOPIN) 0.5 MG tablet Take 0.5 mg by mouth 2 (two) times daily as needed for anxiety.     [provider]  diltiazem (CARDIZEM CD) 360 MG 24 hr capsule Take 1 capsule (360 mg total) by mouth daily. 04/16/20   Netta Neat., NP  lisinopril (ZESTRIL) 20 MG tablet Take 1 tablet (20 mg total) by mouth daily. 08/25/20 11/23/20  Netta Neat., NP  naproxen sodium (ALEVE) 220 MG tablet Take 220-440 mg by mouth daily as needed. For pain    [provider]  OXYGEN Inhale 3 L into the lungs at bedtime.    [provider]  tetrahydrozoline-zinc (VISINE-AC) 0.05-0.25 % ophthalmic solution Place 2 drops into both eyes 3 (three) times daily as needed (Red Itchy Eyes).    [provider]    ALLERGIES:  Allergies  Allergen Reactions  . Shellfish Allergy Hives, Itching and Swelling    SOCIAL HISTORY:  Social History  Tobacco Use  . Smoking status: Former Smoker    Packs/day: 0.50    Years: 20.00    Pack years: 10.00    Types: Cigarettes    Quit date: 06/22/2012    Years since quitting: 8.3  . Smokeless tobacco: Never Used  Substance Use Topics  . Alcohol use: No    FAMILY HISTORY: Family History  Problem Relation Age of Onset  . Hypertension Brother     EXAM: BP (!) 181/120 (BP Location: Left Arm)   Pulse (!) 145   Temp 98.6 F (37 C)   Resp 20   SpO2 95%  CONSTITUTIONAL: Alert and oriented and responds appropriately to questions.  Elderly, obese,  in no distress HEAD: Normocephalic EYES: Conjunctivae clear, pupils appear equal, EOM appear intact ENT: normal nose; moist mucous membranes NECK: Supple, normal ROM CARD: Regularly irregular and tachycardic; S1 and S2 appreciated; no murmurs, no clicks, no rubs, no gallops RESP: Normal chest excursion without splinting or tachypnea; breath sounds clear and equal bilaterally; no wheezes, no rhonchi, no rales, no hypoxia or respiratory distress, speaking full sentences ABD/GI: Normal bowel sounds; non-distended; soft, non-tender, no rebound, no guarding, no peritoneal signs, no hepatosplenomegaly BACK:  The back appears normal EXT: Normal ROM in all joints; no deformity noted, no edema; no cyanosis, no calf tenderness or calf swelling SKIN: Normal color for age and race; warm; no rash on exposed skin NEURO: Moves all extremities equally PSYCH: The patient's mood and manner are appropriate.   MEDICAL DECISION MAKING: Patient here with worsening shortness of breath, cough.  She is in A. fib with RVR.  She does not appear significantly volume overloaded but CHF is on the differential.  Will obtain cardiac labs including BNP.  Chest x-ray concerning for bibasilar atelectasis versus pneumonia.  She does have a dry cough.  Will start antibiotics if Covid swab is negative.  She does not appear septic at this time.  Will start IV diltiazem.  She is not a candidate for cardioversion given she is not on anticoagulation.  I feel her risk of stroke would be too high as her chads vasc 2 score is 4.  ED PROGRESS: Patient has leukocytosis with left shift.  Troponin negative.  Electrolytes within normal limits.  Covid swab negative.  Flu negative.  We will proceed with antibiotics for community-acquired pneumonia.  Heart rate slowly improving on diltiazem infusion.  Will admit.  3:05 AM Discussed patient's case with hospitalist, Dr. Leafy Half.  I have recommended admission and patient (and family if present) agree  with this plan. Admitting physician will place admission orders.   I reviewed all nursing notes, vitals, pertinent previous records and reviewed/interpreted all EKGs, lab and urine results, imaging (as available).    EKG Interpretation  Date/Time:  Sunday October 19 2020 23:16:14 EST Ventricular Rate:  137 PR Interval:    QRS Duration: 92 QT Interval:  318 QTC Calculation: 480 R Axis:   -21 Text Interpretation: Atrial fibrillation with rapid ventricular response Nonspecific ST and T wave abnormality Abnormal ECG Rate faster than previous Confirmed by Kyrin Gratz, Baxter Hire [phone]) on 10/19/2020 11:57:53 PM       CRITICAL CARE Performed by: Baxter Hire Robertine Kipper   Total critical care time: 55 minutes  Critical care time was exclusive of separately billable procedures and treating other patients.  Critical care was necessary to treat or prevent imminent or life-threatening deterioration.  Critical care was time spent personally by me on the following activities: development of treatment plan with  patient and/or surrogate as well as nursing, discussions with consultants, evaluation of patient's response to treatment, examination of patient, obtaining history from patient or surrogate, ordering and performing treatments and interventions, ordering and review of laboratory studies, ordering and review of radiographic studies, pulse oximetry and re-evaluation of patient's condition.   Andrea [name] was evaluated in Emergency Department on 10/19/2020 for the symptoms described in the history of present illness. She was evaluated in the context of the global COVID-19 pandemic, which necessitated consideration that the patient might be at risk for infection with the SARS-CoV-2 virus that causes COVID-19. Institutional protocols and algorithms that pertain to the evaluation of patients at risk for COVID-19 are in a state of rapid change based on information released by regulatory bodies including the CDC and  federal and state organizations. These policies and algorithms were followed during the patient's care in the ED.      Donyel Nester, Andrea Maw, Andrea [name] 10/20/20 [phone]

## 2020-10-19 NOTE — ED Triage Notes (Signed)
Pt c/o cough/shob/body aches, generalized weakness for several days, worse tonight.

## 2020-10-20 ENCOUNTER — Encounter (HOSPITAL_COMMUNITY): Payer: Self-pay | Admitting: Internal Medicine

## 2020-10-20 DIAGNOSIS — I4821 Permanent atrial fibrillation: Secondary | ICD-10-CM | POA: Diagnosis present

## 2020-10-20 DIAGNOSIS — D500 Iron deficiency anemia secondary to blood loss (chronic): Secondary | ICD-10-CM

## 2020-10-20 DIAGNOSIS — I2721 Secondary pulmonary arterial hypertension: Secondary | ICD-10-CM | POA: Diagnosis present

## 2020-10-20 DIAGNOSIS — R2689 Other abnormalities of gait and mobility: Secondary | ICD-10-CM | POA: Diagnosis not present

## 2020-10-20 DIAGNOSIS — Z87891 Personal history of nicotine dependence: Secondary | ICD-10-CM | POA: Diagnosis not present

## 2020-10-20 DIAGNOSIS — J189 Pneumonia, unspecified organism: Secondary | ICD-10-CM | POA: Diagnosis not present

## 2020-10-20 DIAGNOSIS — E1165 Type 2 diabetes mellitus with hyperglycemia: Secondary | ICD-10-CM | POA: Diagnosis present

## 2020-10-20 DIAGNOSIS — R7303 Prediabetes: Secondary | ICD-10-CM | POA: Diagnosis present

## 2020-10-20 DIAGNOSIS — J9811 Atelectasis: Secondary | ICD-10-CM | POA: Diagnosis present

## 2020-10-20 DIAGNOSIS — M2550 Pain in unspecified joint: Secondary | ICD-10-CM | POA: Diagnosis not present

## 2020-10-20 DIAGNOSIS — J9611 Chronic respiratory failure with hypoxia: Secondary | ICD-10-CM | POA: Diagnosis present

## 2020-10-20 DIAGNOSIS — I10 Essential (primary) hypertension: Secondary | ICD-10-CM | POA: Diagnosis not present

## 2020-10-20 DIAGNOSIS — J441 Chronic obstructive pulmonary disease with (acute) exacerbation: Secondary | ICD-10-CM | POA: Diagnosis present

## 2020-10-20 DIAGNOSIS — R410 Disorientation, unspecified: Secondary | ICD-10-CM | POA: Diagnosis not present

## 2020-10-20 DIAGNOSIS — R531 Weakness: Secondary | ICD-10-CM | POA: Diagnosis not present

## 2020-10-20 DIAGNOSIS — R5381 Other malaise: Secondary | ICD-10-CM | POA: Diagnosis not present

## 2020-10-20 DIAGNOSIS — M6258 Muscle wasting and atrophy, not elsewhere classified, other site: Secondary | ICD-10-CM | POA: Diagnosis not present

## 2020-10-20 DIAGNOSIS — I110 Hypertensive heart disease with heart failure: Secondary | ICD-10-CM | POA: Diagnosis present

## 2020-10-20 DIAGNOSIS — Z9981 Dependence on supplemental oxygen: Secondary | ICD-10-CM | POA: Diagnosis not present

## 2020-10-20 DIAGNOSIS — Z79899 Other long term (current) drug therapy: Secondary | ICD-10-CM | POA: Diagnosis not present

## 2020-10-20 DIAGNOSIS — Z7951 Long term (current) use of inhaled steroids: Secondary | ICD-10-CM | POA: Diagnosis not present

## 2020-10-20 DIAGNOSIS — E785 Hyperlipidemia, unspecified: Secondary | ICD-10-CM | POA: Insufficient documentation

## 2020-10-20 DIAGNOSIS — Z23 Encounter for immunization: Secondary | ICD-10-CM | POA: Diagnosis not present

## 2020-10-20 DIAGNOSIS — T380X5A Adverse effect of glucocorticoids and synthetic analogues, initial encounter: Secondary | ICD-10-CM | POA: Diagnosis present

## 2020-10-20 DIAGNOSIS — Z91013 Allergy to seafood: Secondary | ICD-10-CM | POA: Diagnosis not present

## 2020-10-20 DIAGNOSIS — M6281 Muscle weakness (generalized): Secondary | ICD-10-CM | POA: Diagnosis not present

## 2020-10-20 DIAGNOSIS — I4891 Unspecified atrial fibrillation: Secondary | ICD-10-CM | POA: Diagnosis not present

## 2020-10-20 DIAGNOSIS — R41841 Cognitive communication deficit: Secondary | ICD-10-CM | POA: Diagnosis not present

## 2020-10-20 DIAGNOSIS — Z7401 Bed confinement status: Secondary | ICD-10-CM | POA: Diagnosis not present

## 2020-10-20 DIAGNOSIS — Z6841 Body Mass Index (BMI) 40.0 and over, adult: Secondary | ICD-10-CM | POA: Diagnosis not present

## 2020-10-20 DIAGNOSIS — E662 Morbid (severe) obesity with alveolar hypoventilation: Secondary | ICD-10-CM | POA: Diagnosis present

## 2020-10-20 DIAGNOSIS — D72828 Other elevated white blood cell count: Secondary | ICD-10-CM | POA: Diagnosis present

## 2020-10-20 DIAGNOSIS — E876 Hypokalemia: Secondary | ICD-10-CM | POA: Diagnosis present

## 2020-10-20 DIAGNOSIS — E7800 Pure hypercholesterolemia, unspecified: Secondary | ICD-10-CM | POA: Diagnosis present

## 2020-10-20 DIAGNOSIS — Z20822 Contact with and (suspected) exposure to covid-19: Secondary | ICD-10-CM | POA: Diagnosis present

## 2020-10-20 DIAGNOSIS — F39 Unspecified mood [affective] disorder: Secondary | ICD-10-CM | POA: Diagnosis present

## 2020-10-20 LAB — I-STAT ARTERIAL BLOOD GAS, ED
Acid-Base Excess: 3 mmol/L — ABNORMAL HIGH (ref 0.0–2.0)
Bicarbonate: 27.6 mmol/L (ref 20.0–28.0)
Calcium, Ion: 1.17 mmol/L (ref 1.15–1.40)
HCT: 34 % — ABNORMAL LOW (ref 36.0–46.0)
Hemoglobin: 11.6 g/dL — ABNORMAL LOW (ref 12.0–15.0)
O2 Saturation: 99 %
Patient temperature: 98.6
Potassium: 3.2 mmol/L — ABNORMAL LOW (ref 3.5–5.1)
Sodium: 140 mmol/L (ref 135–145)
TCO2: 29 mmol/L (ref 22–32)
pCO2 arterial: 40.2 mmHg (ref 32.0–48.0)
pH, Arterial: 7.444 (ref 7.350–7.450)
pO2, Arterial: 136 mmHg — ABNORMAL HIGH (ref 83.0–108.0)

## 2020-10-20 LAB — CBC WITH DIFFERENTIAL/PLATELET
Abs Immature Granulocytes: 0.05 10*3/uL (ref 0.00–0.07)
Abs Immature Granulocytes: 0.04 10*3/uL (ref 0.00–0.07)
Basophils Absolute: 0.1 10*3/uL (ref 0.0–0.1)
Basophils Absolute: 0.1 10*3/uL (ref 0.0–0.1)
Basophils Relative: 1 %
Basophils Relative: 0 %
Eosinophils Absolute: 0.1 10*3/uL (ref 0.0–0.5)
Eosinophils Absolute: 0 10*3/uL (ref 0.0–0.5)
Eosinophils Relative: 1 %
Eosinophils Relative: 0 %
HCT: 38 % (ref 36.0–46.0)
HCT: 37.6 % (ref 36.0–46.0)
Hemoglobin: 11.5 g/dL — ABNORMAL LOW (ref 12.0–15.0)
Hemoglobin: 11.4 g/dL — ABNORMAL LOW (ref 12.0–15.0)
Immature Granulocytes: 0 %
Immature Granulocytes: 0 %
Lymphocytes Relative: 10 %
Lymphocytes Relative: 4 %
Lymphs Abs: 1.2 10*3/uL (ref 0.7–4.0)
Lymphs Abs: 0.5 10*3/uL — ABNORMAL LOW (ref 0.7–4.0)
MCH: 25.3 pg — ABNORMAL LOW (ref 26.0–34.0)
MCH: 25.3 pg — ABNORMAL LOW (ref 26.0–34.0)
MCHC: 30.3 g/dL (ref 30.0–36.0)
MCHC: 30.3 g/dL (ref 30.0–36.0)
MCV: 83.5 fL (ref 80.0–100.0)
MCV: 83.4 fL (ref 80.0–100.0)
Monocytes Absolute: 0.7 10*3/uL (ref 0.1–1.0)
Monocytes Absolute: 0.4 10*3/uL (ref 0.1–1.0)
Monocytes Relative: 6 %
Monocytes Relative: 3 %
Neutro Abs: 10 10*3/uL — ABNORMAL HIGH (ref 1.7–7.7)
Neutro Abs: 10.8 10*3/uL — ABNORMAL HIGH (ref 1.7–7.7)
Neutrophils Relative %: 82 %
Neutrophils Relative %: 93 %
Platelets: 307 10*3/uL (ref 150–400)
Platelets: 298 10*3/uL (ref 150–400)
RBC: 4.55 MIL/uL (ref 3.87–5.11)
RBC: 4.51 MIL/uL (ref 3.87–5.11)
RDW: 16.9 % — ABNORMAL HIGH (ref 11.5–15.5)
RDW: 16.9 % — ABNORMAL HIGH (ref 11.5–15.5)
WBC: 12.1 10*3/uL — ABNORMAL HIGH (ref 4.0–10.5)
WBC: 11.7 10*3/uL — ABNORMAL HIGH (ref 4.0–10.5)
nRBC: 0 % (ref 0.0–0.2)
nRBC: 0 % (ref 0.0–0.2)

## 2020-10-20 LAB — HEMOGLOBIN A1C
Hgb A1c MFr Bld: 6.9 % — ABNORMAL HIGH (ref 4.8–5.6)
Mean Plasma Glucose: 151.33 mg/dL

## 2020-10-20 LAB — BASIC METABOLIC PANEL
Anion gap: 16 — ABNORMAL HIGH (ref 5–15)
BUN: 8 mg/dL (ref 8–23)
CO2: 22 mmol/L (ref 22–32)
Calcium: 9.2 mg/dL (ref 8.9–10.3)
Chloride: 101 mmol/L (ref 98–111)
Creatinine, Ser: 0.83 mg/dL (ref 0.44–1.00)
GFR, Estimated: >60 mL/min (ref >60)
Glucose, Bld: 184 mg/dL — ABNORMAL HIGH (ref 70–99)
Potassium: 3.6 mmol/L (ref 3.5–5.1)
Sodium: 139 mmol/L (ref 135–145)

## 2020-10-20 LAB — COMPREHENSIVE METABOLIC PANEL
ALT: 12 U/L (ref 0–44)
AST: 16 U/L (ref 15–41)
Albumin: 3.5 g/dL (ref 3.5–5.0)
Alkaline Phosphatase: 134 U/L — ABNORMAL HIGH (ref 38–126)
Anion gap: 17 — ABNORMAL HIGH (ref 5–15)
BUN: 8 mg/dL (ref 8–23)
CO2: 22 mmol/L (ref 22–32)
Calcium: 8.8 mg/dL — ABNORMAL LOW (ref 8.9–10.3)
Chloride: 98 mmol/L (ref 98–111)
Creatinine, Ser: 0.83 mg/dL (ref 0.44–1.00)
GFR, Estimated: >60 mL/min (ref >60)
Glucose, Bld: 239 mg/dL — ABNORMAL HIGH (ref 70–99)
Potassium: 3.6 mmol/L (ref 3.5–5.1)
Sodium: 137 mmol/L (ref 135–145)
Total Bilirubin: 0.8 mg/dL (ref 0.3–1.2)
Total Protein: 7.4 g/dL (ref 6.5–8.1)

## 2020-10-20 LAB — RESPIRATORY PANEL BY PCR

## 2020-10-20 LAB — RESP PANEL BY RT-PCR (FLU A&B, COVID) ARPGX2
Influenza A by PCR: NEGATIVE
Influenza B by PCR: NEGATIVE
SARS Coronavirus 2 by RT PCR: NEGATIVE

## 2020-10-20 LAB — CBG MONITORING, ED
Glucose-Capillary: 254 mg/dL — ABNORMAL HIGH (ref 70–99)
Glucose-Capillary: 366 mg/dL — ABNORMAL HIGH (ref 70–99)
Glucose-Capillary: 275 mg/dL — ABNORMAL HIGH (ref 70–99)
Glucose-Capillary: 336 mg/dL — ABNORMAL HIGH (ref 70–99)

## 2020-10-20 LAB — BRAIN NATRIURETIC PEPTIDE: B Natriuretic Peptide: 270.6 pg/mL — ABNORMAL HIGH (ref 0.0–100.0)

## 2020-10-20 LAB — PROCALCITONIN: Procalcitonin: <0.1 ng/mL

## 2020-10-20 LAB — TROPONIN I (HIGH SENSITIVITY)
Troponin I (High Sensitivity): 15 ng/L (ref <18)
Troponin I (High Sensitivity): 15 ng/L (ref <18)

## 2020-10-20 LAB — MAGNESIUM
Magnesium: 1.8 mg/dL (ref 1.7–2.4)
Magnesium: 2.3 mg/dL (ref 1.7–2.4)

## 2020-10-20 LAB — LACTIC ACID, PLASMA: Lactic Acid, Venous: 1.8 mmol/L (ref 0.5–1.9)

## 2020-10-20 MED ORDER — INSULIN ASPART 100 UNIT/ML ~~LOC~~ SOLN
0.0000 [IU] | Freq: Every day | SUBCUTANEOUS | Status: DC
  Administered 2020-10-20: 4 [IU] via SUBCUTANEOUS
  Administered 2020-10-21: 2 [IU] via SUBCUTANEOUS
  Administered 2020-10-22 – 2020-10-23 (×2): 3 [IU] via SUBCUTANEOUS

## 2020-10-20 MED ORDER — ENOXAPARIN SODIUM 40 MG/0.4ML ~~LOC~~ SOLN
40.0000 mg | SUBCUTANEOUS | Status: DC
  Administered 2020-10-20 – 2020-10-24 (×5): 40 mg via SUBCUTANEOUS
  Filled 2020-10-20 (×5): qty 0.4

## 2020-10-20 MED ORDER — CITALOPRAM HYDROBROMIDE 20 MG PO TABS
20.0000 mg | ORAL_TABLET | Freq: Every day | ORAL | Status: DC
  Administered 2020-10-20 – 2020-10-24 (×5): 20 mg via ORAL
  Filled 2020-10-20 (×3): qty 1
  Filled 2020-10-20: qty 2
  Filled 2020-10-20: qty 1

## 2020-10-20 MED ORDER — IPRATROPIUM-ALBUTEROL 0.5-2.5 (3) MG/3ML IN SOLN
3.0000 mL | Freq: Four times a day (QID) | RESPIRATORY_TRACT | Status: DC
  Administered 2020-10-20 (×4): 3 mL via RESPIRATORY_TRACT
  Filled 2020-10-20 (×5): qty 3

## 2020-10-20 MED ORDER — DILTIAZEM LOAD VIA INFUSION
10.0000 mg | Freq: Once | INTRAVENOUS | Status: AC
  Administered 2020-10-20: 10 mg via INTRAVENOUS
  Filled 2020-10-20: qty 10

## 2020-10-20 MED ORDER — DILTIAZEM HCL-DEXTROSE 125-5 MG/125ML-% IV SOLN (PREMIX)
5.0000 mg/h | INTRAVENOUS | Status: DC
  Administered 2020-10-20: 5 mg/h via INTRAVENOUS
  Filled 2020-10-20: qty 125

## 2020-10-20 MED ORDER — MAGNESIUM SULFATE IN D5W 1-5 GM/100ML-% IV SOLN
1.0000 g | Freq: Once | INTRAVENOUS | Status: AC
  Administered 2020-10-20: 1 g via INTRAVENOUS
  Filled 2020-10-20: qty 100

## 2020-10-20 MED ORDER — SODIUM CHLORIDE 0.9 % IV SOLN
500.0000 mg | Freq: Once | INTRAVENOUS | Status: AC
  Administered 2020-10-20: 500 mg via INTRAVENOUS
  Filled 2020-10-20: qty 500

## 2020-10-20 MED ORDER — IPRATROPIUM-ALBUTEROL 0.5-2.5 (3) MG/3ML IN SOLN: 3.0000 mL | RESPIRATORY_TRACT | Status: DC | PRN

## 2020-10-20 MED ORDER — METHYLPREDNISOLONE SODIUM SUCC 125 MG IJ SOLR
60.0000 mg | Freq: Two times a day (BID) | INTRAMUSCULAR | Status: DC
  Administered 2020-10-20 (×2): 60 mg via INTRAVENOUS
  Filled 2020-10-20 (×2): qty 2

## 2020-10-20 MED ORDER — AZITHROMYCIN 250 MG PO TABS
250.0000 mg | ORAL_TABLET | Freq: Every day | ORAL | Status: AC
  Administered 2020-10-20 – 2020-10-23 (×4): 250 mg via ORAL
  Filled 2020-10-20 (×4): qty 1

## 2020-10-20 MED ORDER — METHYLPREDNISOLONE SODIUM SUCC 125 MG IJ SOLR
60.0000 mg | Freq: Every day | INTRAMUSCULAR | Status: DC
  Administered 2020-10-21: 60 mg via INTRAVENOUS
  Filled 2020-10-20: qty 2

## 2020-10-20 MED ORDER — DILTIAZEM HCL ER COATED BEADS 180 MG PO CP24
360.0000 mg | ORAL_CAPSULE | Freq: Every day | ORAL | Status: DC
  Administered 2020-10-20 – 2020-10-24 (×5): 360 mg via ORAL
  Filled 2020-10-20: qty 2
  Filled 2020-10-20: qty 1
  Filled 2020-10-20 (×3): qty 2
  Filled 2020-10-20: qty 1

## 2020-10-20 MED ORDER — SODIUM CHLORIDE 0.9 % IV SOLN
1.0000 g | Freq: Once | INTRAVENOUS | Status: AC
  Administered 2020-10-20: 1 g via INTRAVENOUS
  Filled 2020-10-20: qty 10

## 2020-10-20 MED ORDER — ACETAMINOPHEN 325 MG PO TABS: 650.0000 mg | ORAL_TABLET | ORAL | Status: DC | PRN

## 2020-10-20 MED ORDER — ONDANSETRON HCL 4 MG/2ML IJ SOLN: 4.0000 mg | Freq: Four times a day (QID) | INTRAMUSCULAR | Status: DC | PRN

## 2020-10-20 MED ORDER — ACETAMINOPHEN 500 MG PO TABS
1000.0000 mg | ORAL_TABLET | Freq: Once | ORAL | Status: AC
  Administered 2020-10-20: 1000 mg via ORAL
  Filled 2020-10-20: qty 2

## 2020-10-20 MED ORDER — POTASSIUM CHLORIDE CRYS ER 20 MEQ PO TBCR
40.0000 meq | EXTENDED_RELEASE_TABLET | Freq: Once | ORAL | Status: AC
  Administered 2020-10-20: 40 meq via ORAL
  Filled 2020-10-20: qty 2

## 2020-10-20 MED ORDER — LISINOPRIL 20 MG PO TABS
20.0000 mg | ORAL_TABLET | Freq: Every day | ORAL | Status: DC
  Administered 2020-10-20 – 2020-10-24 (×5): 20 mg via ORAL
  Filled 2020-10-20 (×5): qty 1

## 2020-10-20 MED ORDER — IPRATROPIUM-ALBUTEROL 0.5-2.5 (3) MG/3ML IN SOLN
3.0000 mL | Freq: Two times a day (BID) | RESPIRATORY_TRACT | Status: DC
  Administered 2020-10-21 – 2020-10-24 (×7): 3 mL via RESPIRATORY_TRACT
  Filled 2020-10-20 (×6): qty 3

## 2020-10-20 MED ORDER — NAPHAZOLINE-GLYCERIN 0.012-0.2 % OP SOLN: 1.0000 [drp] | Freq: Four times a day (QID) | OPHTHALMIC | Status: DC | PRN

## 2020-10-20 MED ORDER — INSULIN ASPART 100 UNIT/ML ~~LOC~~ SOLN
0.0000 [IU] | Freq: Three times a day (TID) | SUBCUTANEOUS | Status: DC
  Administered 2020-10-20 (×2): 8 [IU] via SUBCUTANEOUS
  Administered 2020-10-20: 11 [IU] via SUBCUTANEOUS
  Administered 2020-10-21 (×2): 3 [IU] via SUBCUTANEOUS

## 2020-10-20 MED ORDER — FUROSEMIDE 10 MG/ML IJ SOLN
40.0000 mg | Freq: Two times a day (BID) | INTRAMUSCULAR | Status: AC
  Administered 2020-10-20 (×2): 40 mg via INTRAVENOUS
  Filled 2020-10-20 (×2): qty 4

## 2020-10-20 MED ORDER — METOPROLOL TARTRATE 25 MG PO TABS
25.0000 mg | ORAL_TABLET | Freq: Two times a day (BID) | ORAL | Status: DC
  Administered 2020-10-20 – 2020-10-24 (×10): 25 mg via ORAL
  Filled 2020-10-20 (×10): qty 1

## 2020-10-20 MED ORDER — INSULIN ASPART 100 UNIT/ML ~~LOC~~ SOLN: 0.0000 [IU] | Freq: Three times a day (TID) | SUBCUTANEOUS | Status: DC

## 2020-10-20 MED ORDER — INSULIN GLARGINE 100 UNIT/ML ~~LOC~~ SOLN
8.0000 [IU] | Freq: Every day | SUBCUTANEOUS | Status: DC
  Administered 2020-10-20: 8 [IU] via SUBCUTANEOUS
  Filled 2020-10-20 (×2): qty 0.08

## 2020-10-20 MED ORDER — CLONAZEPAM 0.5 MG PO TABS: 0.5000 mg | ORAL_TABLET | Freq: Two times a day (BID) | ORAL | Status: DC | PRN

## 2020-10-20 NOTE — H&P (Signed)
History and Physical    Andrea [name] MOQ:[id number] DOB: 10-25-1941 DOA: 10/19/2020  PCP: Ignatius Specking, MD  Patient coming from: Home   Chief Complaint:  Chief Complaint  Patient presents with  . Cough     HPI:    Old female with past medical history of hypertension, permanent atrial fibrillation, iron deficiency anemia, history of GI bleeding, COPD, chronic respiratory failure (on 3lpm via [place] QHS), OSA (not on CPAP), prediabetes presents to [facility] emergency department with complaints of shortness of breath.  Patient explains that approximately 3 weeks ago she began to develop shortness of breath.  Initially the shortness of breath was mild in intensity but progressively became more and more severe as the days progressed.  Shortness of breath became severe in intensity.  Patient shortness of breath worsens with exertion and improved with rest. Patient complains of associated palpitations over the same span of time.  Patient also complains of associated cough, occasionally productive with clear and sometimes yellow sputum.  Patient states that she is compliant with all of her prescribed home medications.  Upon further questioning patient denies sick contacts, fevers, leg swelling, chest pain, changes in appetite, increase in weight or confirmed contact with COVID-19 infection.  Patient symptoms continue to persist until she eventually presented to [facility] emergency department for evaluation.  Upon evaluation in the emergency department patient was found to be in rapid atrial fibrillation.  Patient was also found to have bilateral patchy infiltrates in the bases and mild leukocytosis of 12.1.  Patient was placed on diltiazem infusion for management of rapid atrial fibrillation.  For patchy bilateral lower lobe infiltrates the emergency department provider felt the patient might possibly be suffering from pneumonia and initiated intravenous ceftriaxone and  azithromycin.  The hospitalist group was then called to assess the patient for admission to the hospital.  Review of Systems:   Review of Systems  Constitutional: Positive for malaise/fatigue.  Respiratory: Positive for cough, sputum production, shortness of breath and wheezing.   Neurological: Positive for weakness.  All other systems reviewed and are negative.   Past Medical History:  Diagnosis Date  . Anemia    secondary to acute on chronic GI bleed  . Anxiety disorder   . Diverticulosis   . Exertional dyspnea    Normal LVF, 2-D echo, 09/2012  . Hiatal hernia   . Hypercholesterolemia   . Hypertension   . Left ventricular hypertrophy 10/07/2014  . Obesity   . Osteoarthritis   . Sleep apnea 07/08/2014  . Tobacco abuse     Past Surgical History:  Procedure Laterality Date  . ABDOMINAL HYSTERECTOMY    . GIVENS CAPSULE STUDY N/A 02/19/2013   Procedure: GIVENS CAPSULE STUDY;  Surgeon: Malissa Hippo, MD;  Location: [facility]ITE;  Service: Endoscopy;  Laterality: N/A;  730     reports that she quit smoking about 8 years ago. Her smoking use included cigarettes. She has a 10.00 pack-year smoking history. She has never used smokeless tobacco. She reports that she does not drink alcohol and does not use drugs.  Allergies  Allergen Reactions  . Shellfish Allergy Hives, Itching and Swelling    Family History  Problem Relation Age of Onset  . Hypertension Brother      Prior to Admission medications   Medication Sig Start Date End Date Taking? Authorizing Provider  BREO ELLIPTA 100-25 MCG/INH AEPB Inhale 1 puff into the lungs daily.  07/12/16   [provider]  clonazePAM (KLONOPIN) 0.5 MG tablet Take 0.5 mg by mouth 2 (two) times daily as needed for anxiety.     [provider]  diltiazem (CARDIZEM CD) 360 MG 24 hr capsule Take 1 capsule (360 mg total) by mouth daily. 04/16/20   Netta NeatQuinn, Andrew L Jr., NP  lisinopril (ZESTRIL) 20 MG tablet Take 1 tablet (20 mg  total) by mouth daily. 08/25/20 11/23/20  Netta NeatQuinn, Andrew L Jr., NP  naproxen sodium (ALEVE) 220 MG tablet Take 220-440 mg by mouth daily as needed. For pain    [provider]  OXYGEN Inhale 3 L into the lungs at bedtime.    [provider]  tetrahydrozoline-zinc (VISINE-AC) 0.05-0.25 % ophthalmic solution Place 2 drops into both eyes 3 (three) times daily as needed (Red Itchy Eyes).    [provider]    Physical Exam: Vitals:   10/20/20 0245 10/20/20 0319 10/20/20 0345 10/20/20 0351  BP: (!) 147/85 (!) 152/108 (!) 148/89   Pulse: (!) 118 (!) 118 90   Resp: (!) 27 (!) 24 (!) 28   Temp:      SpO2: 91% 94% 91% 93%    Constitutional: Acute alert and oriented x3, patient is currently in respiratory distress. Skin: no rashes, no lesions, good skin turgor noted. Eyes: Pupils are equally reactive to light.  No evidence of scleral icterus or conjunctival pallor.  ENMT: Moist mucous membranes noted.  Posterior pharynx clear of any exudate or lesions.   Neck: normal, supple, no masses, no thyromegaly.  No evidence of jugular venous distension.   Respiratory: Notable expiratory wheezing heard in all fields with slightly prolonged expiratory phase and bibasilar rales.  Patient is in respiratory distress without any evidence of accessory muscle use.   Cardiovascular: Tachycardic rate with irregularly irregular rhythm.  No murmurs / rubs / gallops. No extremity edema. 2+ pedal pulses. No carotid bruits.  Chest:   Nontender without crepitus or deformity.   Back:   Nontender without crepitus or deformity. Abdomen: Abdomen is soft and nontender.  No evidence of intra-abdominal masses.  Positive bowel sounds noted in all quadrants.   Musculoskeletal: No joint deformity upper and lower extremities. Good ROM, no contractures. Normal muscle tone.  Neurologic: CN 2-12 grossly intact. Sensation intact.  Patient moving all 4 extremities spontaneously.  Patient is following all commands.   Patient is responsive to verbal stimuli.   Psychiatric: Patient exhibits normal mood with appropriate affect.  Patient seems to possess insight as to their current situation.     Labs on Admission: I have personally reviewed following labs and imaging studies -   CBC: Recent Labs  Lab 10/20/20 0106 10/20/20 0405  WBC 12.1*  --   NEUTROABS 10.0*  --   HGB 11.5* 11.6*  HCT 38.0 34.0*  MCV 83.5  --   PLT 307  --    Basic Metabolic Panel: Recent Labs  Lab 10/20/20 0106 10/20/20 0108 10/20/20 0405  NA  --  139 140  K  --  3.6 3.2*  CL  --  101  --   CO2  --  22  --   GLUCOSE  --  184*  --   BUN  --  8  --   CREATININE  --  0.83  --   CALCIUM  --  9.2  --   MG 1.8  --   --    GFR: CrCl cannot be calculated (Unknown ideal weight.). Liver Function Tests: No results for input(s): AST, ALT,  ALKPHOS, BILITOT, PROT, ALBUMIN in the last 168 hours. No results for input(s): LIPASE, AMYLASE in the last 168 hours. No results for input(s): AMMONIA in the last 168 hours. Coagulation Profile: No results for input(s): INR, PROTIME in the last 168 hours. Cardiac Enzymes: No results for input(s): CKTOTAL, CKMB, CKMBINDEX, TROPONINI in the last 168 hours. BNP (last 3 results) No results for input(s): PROBNP in the last 8760 hours. HbA1C: No results for input(s): HGBA1C in the last 72 hours. CBG: No results for input(s): GLUCAP in the last 168 hours. Lipid Profile: No results for input(s): CHOL, HDL, LDLCALC, TRIG, CHOLHDL, LDLDIRECT in the last 72 hours. Thyroid Function Tests: No results for input(s): TSH, T4TOTAL, FREET4, T3FREE, THYROIDAB in the last 72 hours. Anemia Panel: No results for input(s): VITAMINB12, FOLATE, FERRITIN, TIBC, IRON, RETICCTPCT in the last 72 hours. Urine analysis: No results found for: COLORURINE, APPEARANCEUR, LABSPEC, PHURINE, GLUCOSEU, HGBUR, BILIRUBINUR, KETONESUR, PROTEINUR, UROBILINOGEN, NITRITE, LEUKOCYTESUR  Radiological Exams on Admission -  Personally Reviewed: DG Chest Portable 1 View  Result Date: 10/19/2020 CLINICAL DATA:  Cough and shortness of breath. Weakness. Worse tonight. EXAM: PORTABLE CHEST 1 VIEW COMPARISON:  Chest x-ray 09/14/2018, chest x-ray 08/12/2016. FINDINGS: The heart size and mediastinal contours are unchanged with enlarged cardiac silhouette. Aortic arch calcifications. Similar-appearing (as far back as chest x-ray 08/12/2016) right paratracheal stripe thickening measuring 2.6 x 5.5 cm. Linear atelectasis within the left mid lung zone. Bibasilar hazy airspace opacity. No pulmonary edema. No pleural effusion. No pneumothorax. No acute osseous abnormality. Suggestion of calcific tendinosis of the left shoulder. IMPRESSION: Bibasilar hazy airspace opacity that could represent a combination of atelectasis versus infection/inflammation. Electronically Signed   By: Tish Frederickson M.D.   On: 10/19/2020 23:49    EKG: Personally reviewed.  Rhythm is atrial fibrillation with rapid ventricular response with heart rate of 137 bpm.  No dynamic ST segment changes appreciated.  Assessment/Plan Principal Problem:   Permanent atrial fibrillation with rapid ventricular response (HCC)   Patient presenting with 3-week history of shortness of breath and dyspnea on exertion  Patient found to be in rapid atrial fibrillation on arrival  I believe that the longstanding rapid atrial fibrillation has resulted in transient diastolic dysfunction in this patient causing a small degree of volume overload and subsequently causing a COPD exacerbation  Patient has been initiated on intravenous diltiazem infusion which is being titrated to achieve rate control.  Continuing home regimen of metoprolol 25 mg twice daily as well.  Will provide patient a 2 dose regimen of intravenous Lasix to address any transient pulmonary edema caused by the rapid atrial fibrillation  Admitting patient to progressive unit  Patient closely with telemetry  monitoring  Obtaining troponins, TSH  Providing patient with 1 g magnesium sulfate and 40 mEq of potassium chloride  We will consider consultation of cardiology if we are unsuccessful in achieving rate control.  Active Problems:   COPD with acute exacerbation (HCC)   Clinically, I believe patient is suffering from a COPD exacerbation secondary to rapid atrial fibrillation and transient cardiogenic pulmonary edema  Patient is been placed on aggressive bronchodilator therapy with scheduled bronchodilators.  Additionally provided patient with intravenous Solu-Medrol.  Concurrently treating rapid atrial fibrillation and mild acute pulmonary edema with intravenous diuretics and intravenous diltiazem.    Iron deficiency anemia due to chronic blood loss   Monitoring hemoglobin and hematocrit with serial CBCs  No clinical evidence of bleeding    Chronic respiratory failure with hypoxia (HCC)  ABG reveals no evidence of superimposed acute respiratory failure  We will continue to provide patient with 3 L of oxygen via nasal cannula nightly    Prediabetes   Considering initiation of systemic steroids for management of COPD, will perform Accu-Cheks before every meal and nightly while hospitalized with sliding scale insulin  Hemoglobin A1c pending    Essential hypertension    Continue home antihypertensive regimen.   Code Status:  Full code Family Communication: deferred   Status is: Observation  The patient remains OBS appropriate and will d/c before 2 midnights.  Dispo: The patient is from: Home              Anticipated d/c is to: Home              Anticipated d/c date is: 2 days              Patient currently is not medically stable to d/c.        Marinda Elk MD [facility] Pager [phone]  If 7PM-7AM, please contact night-coverage [email or web address] Use universal [place] password for that web site. If you do not have the password, please  call the hospital operator.  10/20/2020, 4:34 AM

## 2020-10-20 NOTE — ED Notes (Signed)
[facility]- [facility] Lucretia Roers [phone] Gone home, updated on pt status, requests any updates be called

## 2020-10-20 NOTE — ED Notes (Signed)
Lunch Tray Ordered @ 1027. °

## 2020-10-20 NOTE — ED Notes (Signed)
Dinner Tray Ordered @ 1716. 

## 2020-10-20 NOTE — ED Notes (Signed)
Nated CBG obtain after lunch, will cover insulin in a couple hours after re-check,

## 2020-10-20 NOTE — Progress Notes (Signed)
PROGRESS NOTE    Andrea Andrea [name]  WUJ:[id number] DOB: February 16, 1941 DOA: 10/19/2020 PCP: Ignatius Specking, MD      Brief Narrative:  Andrea Andrea [name] is a 79 y.o. F with hx AF not on AC due to hx GIB, anemia, DM, HTN, obesity BMI 40, and PAH, OHS, OSA not on CPAP, PAH, and COPD with chronic respiratory failure on 3L who presents with worsening SOB for few weeks, now severe.  Over last 3 weeks, Andrea [name] with progressive SOB on exertion, worsening until severe, associated with cough productive of yellow sputum and palpitations.  In Andrea ER, CXR showed patchy bilateral infiltrates, Afib with rapid rate, and she was started on steroids, antibiotics and bronchodilators.           Assessment & Plan:  Atrial fibrillation, chronic Atrial fibrillation with RVR HR now controlled -Stop drip -Restart home diltiazem -Monitor on tele  COPD with acute exacerbation Acute on chronic respiratory failure with hypoxia -Continue Solu-Medrol -Continue scheduled bronchodilators -Continue azithromycin -Check RVP  Community-acquired pneumonia, doubted Procal negative -Trend procal  Obesity hypoventilation syndrome OSA not on CPAP Moderate pulmonary arterial hypertension Acute CHF ruled out -Continue Lasix today -Strict intake and output  Chronic iron deficiency anemia due to chronic GI bleed  Diabetes -Continue sliding scale corrections  Hypertension -Continue lisinopril, metoprolol  Mood disorder -Continue citalopram            Disposition: Status is: Observation  Andrea Andrea [name] will require care spanning > 2 midnights and should be moved to inpatient because: IV treatments appropriate due to intensity of illness or inability to take PO  Dispo: Andrea Andrea [name] is from: Home              Anticipated d/c is to: TBD              Anticipated d/c date is: 1 day              Andrea [name] currently is not medically stable to d/c.     Andrea Andrea [name] was admitted with hypoxia due to COPD flare,  likely URI related.  She is off dilt dri, but still has edema requiring IV Lasix and still is SOB at rest and with minimal exertion.  Given still SOB with minimal exertion, will need continued inpatient steroids, bronchodilatros and PT eval and is too high risk for readmission or deterioration to safely do this at home.           MDM: This is a no charge note.  For further details, please see H&P by my partner Dr. Leafy Half from earlier today.  Andrea below labs and imaging reports were reviewed and summarized above.    DVT prophylaxis: enoxaparin (LOVENOX) injection 40 mg Start: 10/20/20 1000  Code Status: FULL Family Communication: None           Subjective: Feeling still out of breath, still SOB with exertion but no orthopnea, night cough.  No confusion.  No feer.  Still coughing, still bringing up sputum.  Nasal congestion noted.        Objective: Vitals:   10/20/20 0645 10/20/20 0700 10/20/20 0715 10/20/20 0807  BP: 125/75 138/83 126/85   Pulse: 68 68 80   Resp: (!) 21 (!) 24 (!) 26   Temp:      SpO2: (!) 88% 90% 95% 99%    Intake/Output Summary (Last 24 hours) at 10/20/2020 0944 Last data filed at 10/20/2020 0649 Gross per 24 hour  Intake 100 ml  Output 1000  ml  Net -900 ml   There were no vitals filed for this visit.  Examination: Andrea Andrea [name] was seen and examined.      Data Reviewed: I have personally reviewed following labs and imaging studies:  CBC: Recent Labs  Lab 10/20/20 0106 10/20/20 0405 10/20/20 0601  WBC 12.1*  --  11.7*  NEUTROABS 10.0*  --  10.8*  HGB 11.5* 11.6* 11.4*  HCT 38.0 34.0* 37.6  MCV 83.5  --  83.4  PLT 307  --  298   Basic Metabolic Panel: Recent Labs  Lab 10/20/20 0106 10/20/20 0108 10/20/20 0405 10/20/20 0601  NA  --  139 140 137  K  --  3.6 3.2* 3.6  CL  --  101  --  98  CO2  --  22  --  22  GLUCOSE  --  184*  --  239*  BUN  --  8  --  8  CREATININE  --  0.83  --  0.83  CALCIUM  --  9.2  --  8.8*    MG 1.8  --   --  2.3   GFR: CrCl cannot be calculated (Unknown ideal weight.). Liver Function Tests: Recent Labs  Lab 10/20/20 0601  AST 16  ALT 12  ALKPHOS 134*  BILITOT 0.8  PROT 7.4  ALBUMIN 3.5   No results for input(s): LIPASE, AMYLASE in Andrea last 168 hours. No results for input(s): AMMONIA in Andrea last 168 hours. Coagulation Profile: No results for input(s): INR, PROTIME in Andrea last 168 hours. Cardiac Enzymes: No results for input(s): CKTOTAL, CKMB, CKMBINDEX, TROPONINI in Andrea last 168 hours. BNP (last 3 results) No results for input(s): PROBNP in Andrea last 8760 hours. HbA1C: Recent Labs    10/20/20 0603  HGBA1C 6.9*   CBG: Recent Labs  Lab 10/20/20 0739  GLUCAP 254*   Lipid Profile: No results for input(s): CHOL, HDL, LDLCALC, TRIG, CHOLHDL, LDLDIRECT in Andrea last 72 hours. Thyroid Function Tests: No results for input(s): TSH, T4TOTAL, FREET4, T3FREE, THYROIDAB in Andrea last 72 hours. Anemia Panel: No results for input(s): VITAMINB12, FOLATE, FERRITIN, TIBC, IRON, RETICCTPCT in Andrea last 72 hours. Urine analysis: No results found for: COLORURINE, APPEARANCEUR, LABSPEC, PHURINE, GLUCOSEU, HGBUR, BILIRUBINUR, KETONESUR, PROTEINUR, UROBILINOGEN, NITRITE, LEUKOCYTESUR Sepsis Labs: @LABRCNTIP (procalcitonin:4,lacticacidven:4)  ) Recent Results (from Andrea past 240 hour(s))  Resp Panel by RT-PCR (Flu A&B, Covid) Nasopharyngeal Swab     Status: None   Collection Time: 10/19/20 11:31 PM   Specimen: Nasopharyngeal Swab; Nasopharyngeal(NP) swabs in vial transport medium  Result Value Ref Range Status   SARS Coronavirus 2 by RT PCR NEGATIVE NEGATIVE Final    Comment: (NOTE) SARS-CoV-2 target nucleic acids are NOT DETECTED.  Andrea SARS-CoV-2 RNA is generally detectable in upper respiratory specimens during Andrea acute phase of infection. Andrea lowest concentration of SARS-CoV-2 viral copies this assay can detect is 138 copies/mL. A negative result does not preclude  SARS-Cov-2 infection and should not be used as Andrea sole basis for treatment or other Andrea [name] management decisions. A negative result may occur with  improper specimen collection/handling, submission of specimen other than nasopharyngeal swab, presence of viral mutation(s) within Andrea areas targeted by this assay, and inadequate number of viral copies(<138 copies/mL). A negative result must be combined with clinical observations, Andrea [name] history, and epidemiological information. Andrea expected result is Negative.  Fact Sheet for Patients:  10/21/20  Fact Sheet for Healthcare Providers:  BloggerCourse.com  This test is no t yet approved or cleared  by Andrea [place] and  has been authorized for detection and/or diagnosis of SARS-CoV-2 by FDA under an Emergency Use Authorization (EUA). This EUA will remain  in effect (meaning this test can be used) for Andrea duration of Andrea COVID-19 declaration under Section 564(b)(1) of Andrea Act, 21 U.S.C.section 360bbb-3(b)(1), unless Andrea authorization is terminated  or revoked sooner.       Influenza A by PCR NEGATIVE NEGATIVE Final   Influenza B by PCR NEGATIVE NEGATIVE Final    Comment: (NOTE) Andrea Xpert Xpress SARS-CoV-2/FLU/RSV plus assay is intended as an aid in Andrea diagnosis of influenza from Nasopharyngeal swab specimens and should not be used as a sole basis for treatment. Nasal washings and aspirates are unacceptable for Xpert Xpress SARS-CoV-2/FLU/RSV testing.  Fact Sheet for Patients: BloggerCourse.com  Fact Sheet for Healthcare Providers: SeriousBroker.it  This test is not yet approved or cleared by Andrea [place] FDA and has been authorized for detection and/or diagnosis of SARS-CoV-2 by FDA under an Emergency Use Authorization (EUA). This EUA will remain in effect (meaning this test can be used) for Andrea duration of  Andrea COVID-19 declaration under Section 564(b)(1) of Andrea Act, 21 U.S.C. section 360bbb-3(b)(1), unless Andrea authorization is terminated or revoked.  Performed at [facility], [address] [place], [place] [address]          Radiology Studies: DG Chest Portable 1 View  Result Date: 10/19/2020 CLINICAL DATA:  Cough and shortness of breath. Weakness. Worse tonight. EXAM: PORTABLE CHEST 1 VIEW COMPARISON:  Chest x-ray 09/14/2018, chest x-ray 08/12/2016. FINDINGS: Andrea heart size and mediastinal contours are unchanged with enlarged cardiac silhouette. Aortic arch calcifications. Similar-appearing (as far back as chest x-ray 08/12/2016) right paratracheal stripe thickening measuring 2.6 x 5.5 cm. Linear atelectasis within Andrea left mid lung zone. Bibasilar hazy airspace opacity. No pulmonary edema. No pleural effusion. No pneumothorax. No acute osseous abnormality. Suggestion of calcific tendinosis of Andrea left shoulder. IMPRESSION: Bibasilar hazy airspace opacity that could represent a combination of atelectasis versus infection/inflammation. Electronically Signed   By: Tish Frederickson M.D.   On: 10/19/2020 23:49        Scheduled Meds: . azithromycin  250 mg Oral QHS  . citalopram  20 mg Oral Daily  . diltiazem  360 mg Oral Daily  . enoxaparin (LOVENOX) injection  40 mg Subcutaneous Q24H  . furosemide  40 mg Intravenous Q12H  . insulin aspart  0-15 Units Subcutaneous TID AC & HS  . ipratropium-albuterol  3 mL Nebulization Q6H  . lisinopril  20 mg Oral Daily  . methylPREDNISolone (SOLU-MEDROL) injection  60 mg Intravenous Q12H  . metoprolol tartrate  25 mg Oral BID   Continuous Infusions:    LOS: 0 days    Time spent: 25 miinuts    Alberteen Sam, MD [facility] 10/20/2020, 9:44 AM     Please page though AMION or Epic secure chat:  For password, contact charge nurse

## 2020-10-21 DIAGNOSIS — I4821 Permanent atrial fibrillation: Secondary | ICD-10-CM | POA: Diagnosis not present

## 2020-10-21 LAB — GLUCOSE, CAPILLARY
Glucose-Capillary: 257 mg/dL — ABNORMAL HIGH (ref 70–99)
Glucose-Capillary: 196 mg/dL — ABNORMAL HIGH (ref 70–99)
Glucose-Capillary: 226 mg/dL — ABNORMAL HIGH (ref 70–99)

## 2020-10-21 LAB — BASIC METABOLIC PANEL
Anion gap: 15 (ref 5–15)
BUN: 27 mg/dL — ABNORMAL HIGH (ref 8–23)
CO2: 23 mmol/L (ref 22–32)
Calcium: 10.1 mg/dL (ref 8.9–10.3)
Chloride: 98 mmol/L (ref 98–111)
Creatinine, Ser: 1.12 mg/dL — ABNORMAL HIGH (ref 0.44–1.00)
GFR, Estimated: 49 mL/min — ABNORMAL LOW (ref >60)
Glucose, Bld: 231 mg/dL — ABNORMAL HIGH (ref 70–99)
Potassium: 3.9 mmol/L (ref 3.5–5.1)
Sodium: 136 mmol/L (ref 135–145)

## 2020-10-21 LAB — CBC
HCT: 36.7 % (ref 36.0–46.0)
Hemoglobin: 11.3 g/dL — ABNORMAL LOW (ref 12.0–15.0)
MCH: 25.1 pg — ABNORMAL LOW (ref 26.0–34.0)
MCHC: 30.8 g/dL (ref 30.0–36.0)
MCV: 81.4 fL (ref 80.0–100.0)
Platelets: 348 10*3/uL (ref 150–400)
RBC: 4.51 MIL/uL (ref 3.87–5.11)
RDW: 17.1 % — ABNORMAL HIGH (ref 11.5–15.5)
WBC: 15.4 10*3/uL — ABNORMAL HIGH (ref 4.0–10.5)
nRBC: 0 % (ref 0.0–0.2)

## 2020-10-21 LAB — CBG MONITORING, ED: Glucose-Capillary: 187 mg/dL — ABNORMAL HIGH (ref 70–99)

## 2020-10-21 LAB — TSH: TSH: 0.353 u[IU]/mL (ref 0.350–4.500)

## 2020-10-21 MED ORDER — PNEUMOCOCCAL VAC POLYVALENT 25 MCG/0.5ML IJ INJ
0.5000 mL | INJECTION | INTRAMUSCULAR | Status: AC
  Administered 2020-10-22: 0.5 mL via INTRAMUSCULAR
  Filled 2020-10-21: qty 0.5

## 2020-10-21 MED ORDER — PREDNISONE 20 MG PO TABS
40.0000 mg | ORAL_TABLET | Freq: Every day | ORAL | Status: DC
  Administered 2020-10-22: 40 mg via ORAL
  Filled 2020-10-21: qty 2

## 2020-10-21 MED ORDER — INSULIN ASPART 100 UNIT/ML ~~LOC~~ SOLN
0.0000 [IU] | Freq: Three times a day (TID) | SUBCUTANEOUS | Status: DC
  Administered 2020-10-22: 3 [IU] via SUBCUTANEOUS
  Administered 2020-10-22: 11 [IU] via SUBCUTANEOUS
  Administered 2020-10-22 – 2020-10-23 (×2): 5 [IU] via SUBCUTANEOUS
  Administered 2020-10-23: 11 [IU] via SUBCUTANEOUS
  Administered 2020-10-23: 2 [IU] via SUBCUTANEOUS
  Administered 2020-10-24: 8 [IU] via SUBCUTANEOUS

## 2020-10-21 MED ORDER — INSULIN GLARGINE 100 UNIT/ML ~~LOC~~ SOLN
20.0000 [IU] | Freq: Every day | SUBCUTANEOUS | Status: DC
  Administered 2020-10-21 – 2020-10-23 (×3): 20 [IU] via SUBCUTANEOUS
  Filled 2020-10-21 (×4): qty 0.2

## 2020-10-21 NOTE — Evaluation (Signed)
Physical Therapy Evaluation Patient Details Name: Andrea [name] MRN: [id number] DOB: 1941/11/03 Today's Date: 10/21/2020   History of Present Illness  79yo female c/o SOB progressive over past 3 weeks. Found to be in A-fib with RVR in the ED, covid negative. Admitted with A-fib with RVR, COPD exacerbation. PMH anxiety, DOE, HLD, HTN, LV hypertrophy, tobacco abuse  Clinical Impression   Patient received sitting at EOB with RN, pleasant and cooperative today. Demonstrates very poor balance and safety awareness both statically and dynamically in standing with U UE support, did much better with RW but still easily fatigued and with poor safety. Often needs multiple tries and heavy physical assist to stand from low surfaces. Reports multiple falls at home due to LOB when turning over recent months. Left up in recliner with all needs met, chair alarm active. Would very much benefit from SNF for short term rehab prior to return home.     Follow Up Recommendations SNF;Supervision/Assistance - 24 hour    Equipment Recommendations  Rolling walker with 5" wheels;3in1 (PT);Other (comment) (shower bench)    Recommendations for Other Services       Precautions / Restrictions Precautions Precautions: Fall Restrictions Weight Bearing Restrictions: No      Mobility  Bed Mobility               General bed mobility comments: sitting at EOB with RN upon entry    Transfers Overall transfer level: Needs assistance Equipment used: None Transfers: Sit to/from Stand Sit to Stand: Mod assist         General transfer comment: Mod cues for hand placement/sequencing, min guard to stand from elevated bed but as much as ModA to stand from lower surfaces such as toilet  Ambulation/Gait Ambulation/Gait assistance: Min guard;Mod assist Gait Distance (Feet): 40 Feet (28f, 121f 2049fAssistive device: Rolling walker (2 wheeled);None Gait Pattern/deviations: Step-to pattern;Decreased step length  - right;Decreased step length - left;Decreased stride length;Trunk flexed;Drifts right/left;Wide base of support Gait velocity: decreased   General Gait Details: heavy ModA to maintain balance wiithout RW during gait and with frequent reaches out of BOS; improved greatly with RW, fading to min guard assist and cues for safety/proximity to device  Stairs            Wheelchair Mobility    Modified Rankin (Stroke Patients Only)       Balance Overall balance assessment: Needs assistance;History of Falls Sitting-balance support: Bilateral upper extremity supported;Feet supported Sitting balance-Leahy Scale: Good     Standing balance support: During functional activity;Single extremity supported Standing balance-Leahy Scale: Poor Standing balance comment: ModA to maintani balance with U UE support                             Pertinent Vitals/Pain Pain Assessment: No/denies pain    Home Living Family/patient expects to be discharged to:: Private residence Living Arrangements: Other relatives (grandson) Available Help at Discharge: Family;Available PRN/intermittently Type of Home: House Home Access: Stairs to enter Entrance Stairs-Rails: None Entrance Stairs-Number of Steps: 1 Home Layout: Two level;Able to live on main level with bedroom/bathroom Home Equipment: Walker - 4 wheels;Cane - single point Additional Comments: doesn't use her cane because she "doesn't trust it" and only uses her RW part of the time due to the narrow hallways in her home, mostly wall walks and has fallen 5 times recently    Prior Function Level of Independence: Independent with assistive device(s)  Comments: but falls frequently     Hand Dominance        Extremity/Trunk Assessment   Upper Extremity Assessment Upper Extremity Assessment: Generalized weakness    Lower Extremity Assessment Lower Extremity Assessment: Generalized weakness    Cervical / Trunk  Assessment Cervical / Trunk Assessment: Kyphotic  Communication   Communication: No difficulties  Cognition Arousal/Alertness: Awake/alert Behavior During Therapy: WFL for tasks assessed/performed Overall Cognitive Status: Within Functional Limits for tasks assessed                                 General Comments: functional but perhaps mildly confused- asks repeated questions back to back and calls her rollator her "roller blade"      General Comments General comments (skin integrity, edema, etc.): SpO2 >96% on RA with activity, just very DOE and with frequent coughing spells    Exercises     Assessment/Plan    PT Assessment Patient needs continued PT services  PT Problem List Decreased strength;Decreased cognition;Obesity;Decreased knowledge of use of DME;Decreased activity tolerance;Decreased safety awareness;Decreased balance;Decreased mobility;Decreased coordination;Cardiopulmonary status limiting activity       PT Treatment Interventions DME instruction;Balance training;Gait training;Stair training;Cognitive remediation;Functional mobility training;Patient/family education;Therapeutic activities;Therapeutic exercise    PT Goals (Current goals can be found in the Care Plan section)  Acute Rehab PT Goals Patient Stated Goal: get stronger/go to rehab PT Goal Formulation: With patient Time For Goal Achievement: 11/04/20 Potential to Achieve Goals: Fair    Frequency Min 2X/week   Barriers to discharge        Co-evaluation               AM-PAC PT "6 Clicks" Mobility  Outcome Measure Help needed turning from your back to your side while in a flat bed without using bedrails?: A Little Help needed moving from lying on your back to sitting on the side of a flat bed without using bedrails?: A Little Help needed moving to and from a bed to a chair (including a wheelchair)?: A Lot Help needed standing up from a chair using your arms (e.g., wheelchair or  bedside chair)?: A Lot Help needed to walk in hospital room?: A Lot Help needed climbing 3-5 steps with a railing? : Total 6 Click Score: 13    End of Session   Activity Tolerance: Patient tolerated treatment well Patient left: in chair;with call bell/phone within reach;with chair alarm set Nurse Communication: Mobility status PT Visit Diagnosis: Unsteadiness on feet (R26.81);Repeated falls (R29.6);Muscle weakness (generalized) (M62.81);Difficulty in walking, not elsewhere classified (R26.2);Other abnormalities of gait and mobility (R26.89)    Time: 1010-1039 PT Time Calculation (min) (ACUTE ONLY): 29 min   Charges:   PT Evaluation $PT Eval Moderate Complexity: 1 Mod PT Treatments $Gait Training: 8-22 mins       Windell Norfolk, DPT, PN1   Supplemental Physical Therapist [place]    Pager [phone] Acute Rehab Office [phone]

## 2020-10-21 NOTE — Progress Notes (Signed)
Inpatient Diabetes Program Recommendations  AACE/ADA: New Consensus Statement on Inpatient Glycemic Control (2015)  Target Ranges:  Prepandial:   less than 140 mg/dL      Peak postprandial:   less than 180 mg/dL (1-2 hours)      Critically ill patients:  140 - 180 mg/dL   Lab Results  Component Value Date   GLUCAP 187 (H) 10/21/2020   HGBA1C 6.9 (H) 10/20/2020    Review of Glycemic Control Results for AURILLA, COULIBALY (MRN [id number]) as of 10/21/2020 09:43  Ref. Range 10/20/2020 16:42 10/20/2020 23:09 10/21/2020 08:12  Glucose-Capillary Latest Ref Range: 70 - 99 mg/dL 563 (H) 893 (H) 734 (H)   Diabetes history: Type 2 DM Outpatient Diabetes medications: none Current orders for Inpatient glycemic control: Lantus 8 units QHS, Novolog 0-15 units TID, Novolog 0-5 units QHS Solumedrol 60 mg QD (taperred today)  Inpatient Diabetes Program Recommendations:    Consider increasing Lantus to 12 units QHS and adding Novolog 4 units TID (assuming patient is consuming >50% of meal).   Thanks, Lujean Rave, MSN, RNC-OB Diabetes Coordinator [phone] (8a-5p)

## 2020-10-21 NOTE — Plan of Care (Signed)

## 2020-10-21 NOTE — NC FL2 (Signed)
[place] MEDICAID FL2 LEVEL OF CARE SCREENING TOOL     IDENTIFICATION  Patient Name: Andrea [name] Birthdate: 03-26-1941 Sex: female Admission Date (Current Location): 10/19/2020  [place] and [place] Number:   [place] [place])   Facility and Address:  [facility], [address] [place], [place] [address]      Provider Number: [id number]  Attending Physician Name and Address:  Alberteen Sam, *  Relative Name and Phone Number:  Layla Maw (brother) [phone]    Current Level of Care: Hospital Recommended Level of Care: Skilled Nursing Facility Prior Approval Number:    Date Approved/Denied:   PASRR Number:    Discharge Plan: SNF    Current Diagnoses: Patient Active Problem List   Diagnosis Date Noted  . Hyperlipidemia 10/20/2020  . Chronic respiratory failure with hypoxia (HCC) 10/20/2020  . COPD with acute exacerbation (HCC) 10/20/2020  . Prediabetes 10/20/2020  . COPD exacerbation (HCC) 10/20/2020  . Anxiety 08/12/2016  . Permanent atrial fibrillation with rapid ventricular response (HCC) 08/12/2016  . History of iron deficiency anemia 08/12/2016  . History of GI bleed 08/12/2016  . Dyspnea   . Left ventricular hypertrophy 10/07/2014  . Sleep apnea 07/08/2014  . Iron deficiency anemia due to chronic blood loss 03/25/2014  . Palpitations 11/02/2012  . Exertional dyspnea   . Essential hypertension     Orientation RESPIRATION BLADDER Height & Weight     Self, Time, Situation, Place  Normal Continent Weight:   Height:     BEHAVIORAL SYMPTOMS/MOOD NEUROLOGICAL BOWEL NUTRITION STATUS     (n/a) Continent Diet  AMBULATORY STATUS COMMUNICATION OF NEEDS Skin   Limited Assist (in guard/ moderate assist/ rolling walker) Verbally Normal                       Personal Care Assistance Level of Assistance  Bathing, Feeding, Dressing, Total care Bathing Assistance: Limited assistance Feeding assistance:  Limited assistance Dressing Assistance: Limited assistance Total Care Assistance: Limited assistance   Functional Limitations Info  Sight, Hearing, Speech Sight Info: Impaired Hearing Info: Adequate Speech Info: Adequate    SPECIAL CARE FACTORS FREQUENCY  PT (By licensed PT), OT (By licensed OT)     PT Frequency: 5X OT Frequency: 5X            Contractures Contractures Info: Not present    Additional Factors Info  Code Status, Allergies, Psychotropic, Insulin Sliding Scale, Isolation Precautions, Suctioning Needs Code Status Info: Full Allergies Info: shellfish allergy Psychotropic Info: see discharge summary Insulin Sliding Scale Info: see discharge summary Isolation Precautions Info: droplet Suctioning Needs: none   Current Medications (10/21/2020):  This is the current hospital active medication list Current Facility-Administered Medications  Medication Dose Route Frequency Provider Last Rate Last Admin  . acetaminophen (TYLENOL) tablet 650 mg  650 mg Oral Q4H PRN Shalhoub, Deno Lunger, MD      . azithromycin [facility]) tablet 250 mg  250 mg Oral QHS Alberteen Sam, MD   250 mg at 10/20/20 2259  . citalopram (CELEXA) tablet 20 mg  20 mg Oral Daily Danford, Earl Lites, MD   20 mg at 10/21/20 1158  . clonazePAM (KLONOPIN) tablet 0.5 mg  0.5 mg Oral BID PRN Marinda Elk, MD      . diltiazem (CARDIZEM CD) 24 hr capsule 360 mg  360 mg Oral Daily Danford, Earl Lites, MD   360 mg at 10/21/20 1157  . enoxaparin (LOVENOX) injection 40  mg  40 mg Subcutaneous Q24H Shalhoub, Deno Lunger, MD   40 mg at 10/21/20 1201  . insulin aspart (novoLOG) injection 0-15 Units  0-15 Units Subcutaneous TID AC & HS Shalhoub, Deno Lunger, MD   3 Units at 10/21/20 1204  . insulin aspart (novoLOG) injection 0-5 Units  0-5 Units Subcutaneous QHS Alberteen Sam, MD   4 Units at 10/20/20 2317  . insulin glargine (LANTUS) injection 20 Units  20 Units Subcutaneous QHS Danford,  Christopher P, MD      . ipratropium-albuterol (DUONEB) 0.5-2.5 (3) MG/3ML nebulizer solution 3 mL  3 mL Nebulization Q4H PRN Shalhoub, Deno Lunger, MD      . ipratropium-albuterol (DUONEB) 0.5-2.5 (3) MG/3ML nebulizer solution 3 mL  3 mL Nebulization BID Danford, Earl Lites, MD      . lisinopril (ZESTRIL) tablet 20 mg  20 mg Oral Daily Shalhoub, Deno Lunger, MD   20 mg at 10/21/20 1158  . metoprolol tartrate (LOPRESSOR) tablet 25 mg  25 mg Oral BID Marinda Elk, MD   25 mg at 10/21/20 1158  . naphazoline-glycerin (CLEAR EYES REDNESS) ophth solution 1 drop  1 drop Both Eyes QID PRN Shalhoub, Deno Lunger, MD      . ondansetron [facility]) injection 4 mg  4 mg Intravenous Q6H PRN Shalhoub, Deno Lunger, MD      . Melene Muller ON 10/22/2020] pneumococcal 23 valent vaccine (PNEUMOVAX-23) injection 0.5 mL  0.5 mL Intramuscular Tomorrow-1000 Danford, Earl Lites, MD      . Melene Muller ON 10/22/2020] predniSONE (DELTASONE) tablet 40 mg  40 mg Oral Q breakfast Danford, Earl Lites, MD         Discharge Medications: Please see discharge summary for a list of discharge medications.  Relevant Imaging Results:  Relevant Lab Results:   Additional Information SS# [id number]  Beckie Busing, RN

## 2020-10-21 NOTE — Progress Notes (Addendum)
PROGRESS NOTE    Andrea [name]  ESP:[id number] DOB: 01-17-1941 DOA: 10/19/2020 PCP: Ignatius Specking, MD      Brief Narrative:  Andrea [name] is a 79 y.o. F with hx AF not on AC due to hx GIB, anemia, DM, HTN, obesity BMI 40, and PAH, OHS, OSA not on CPAP, PAH, and COPD with chronic respiratory failure on 3L who presents with worsening SOB for few weeks, now severe.  Over last 3 weeks, patient with progressive SOB on exertion, worsening until severe, associated with cough productive of yellow sputum and palpitations.  In the ER, CXR showed patchy bilateral infiltrates, Afib with rapid rate, and she was started on steroids, antibiotics and bronchodilators.           Assessment & Plan:   COPD with acute exacerbation Chronic respiratory failure with hypoxia Patient admitted and started on steroids, bronchodilators, antibiotics.  Her breathing is back to baseline.  No evidence of acute respiratory failure.  -Continue steroids, day 3 of 5 -Continue azithromycin -Continue bronchodilators   Community-acquired pneumonia, doubted Procal negative  Atrial fibrillation, chronic Atrial fibrillation with RVR Patient admitted with A. fib with rates in the 130s.  Started on diltiazem drip, rate controlled overnight, transition back to home diltiazem -Continue diltiazem  Obesity hypoventilation syndrome OSA not on CPAP Moderate pulmonary arterial hypertension Acute CHF ruled out Admitted on diuretics, appeared euvolemic.  Diuretics stopped yesterday afternoon, creatinine slightly up today -Stop diuretics   Chronic iron deficiency anemia due to chronic GI bleed  Diabetes Glucose elevated on steroids -Continue Lantus, increased dose  -Continue sliding scale corrections  Hypertension -Continue lisinopril, metoprolol -Trend BMP  Mood disorder -Continue citalopram             Disposition: Status is: INPATINET  Patient requires ongoing inpatient treatment due to  unsafe discharge plan      Dispo: The patient is from: Home              Anticipated d/c is to: TBD              Anticipated d/c date is: 1 day              Patient currently is medically stable to d/c.     The patient was admitted with hypoxia due to COPD flare, likely URI related as well as Afib with RVR.  She is off dilt drip, and breathing is at baseline but she is significantly debilitated from this illness, unable to walk a few feet wtihout assistance, has no safe discharge disposition, will require significant rehabilitation to return to her prior level of function and independence.          MDM: The below labs and imaging reports reviewed and summarized above.  Medication management as above.     DVT prophylaxis: enoxaparin (LOVENOX) injection 40 mg Start: 10/20/20 1000  Code Status: FULL Family Communication: None           Subjective: Patient's breathing is back to normal, she had no confusion or fever, she still coughing, and she is very unsteady on her feet.        Objective: Vitals:   10/21/20 0600 10/21/20 0730 10/21/20 0959 10/21/20 1333  BP: (!) 153/97 (!) 155/83 (!) 161/80 (!) 148/97  Pulse: 89 86 85 71  Resp: 20 20  18   Temp:   97.7 F (36.5 C) 98.1 F (36.7 C)  TempSrc:   Oral   SpO2: 95% 97% 100% 97%  No intake or output data in the 24 hours ending 10/21/20 1550 There were no vitals filed for this visit.  Examination: General appearance: Obese adult female, lying in bed, no acute distress     HEENT: Anicteric, conjunctive are pink, lids and lashes normal.  No nasal deformity, discharge, or epistaxis.  Lips normal, dentition normal, oropharynx moist without oral lesions, hearing normal Skin: Skin warm and dry, no suspicious rashes or lesion Cardiac: Regular rate irregular rhythm, no murmurs, JVP not visible, no lower extremity edema Respiratory: Respiratory effort normal, still some wheezing, improved from yesterday, good air  movement Abdomen: Abdomen soft nontender to palpation or guarding MSK: No clubbing Neuro: Extraocular movements intact, moves all extremities with generalized weakness but normal coordination, speech fluent Psych: Attention normal, affect normal, judgment insight appears normal      Data Reviewed: I have personally reviewed following labs and imaging studies:  CBC: Recent Labs  Lab 10/20/20 0106 10/20/20 0405 10/20/20 0601 10/21/20 0228  WBC 12.1*  --  11.7* 15.4*  NEUTROABS 10.0*  --  10.8*  --   HGB 11.5* 11.6* 11.4* 11.3*  HCT 38.0 34.0* 37.6 36.7  MCV 83.5  --  83.4 81.4  PLT 307  --  298 348   Basic Metabolic Panel: Recent Labs  Lab 10/20/20 0106 10/20/20 0108 10/20/20 0405 10/20/20 0601 10/21/20 0228  NA  --  139 140 137 136  K  --  3.6 3.2* 3.6 3.9  CL  --  101  --  98 98  CO2  --  22  --  22 23  GLUCOSE  --  184*  --  239* 231*  BUN  --  8  --  8 27*  CREATININE  --  0.83  --  0.83 1.12*  CALCIUM  --  9.2  --  8.8* 10.1  MG 1.8  --   --  2.3  --    GFR: CrCl cannot be calculated (Unknown ideal weight.). Liver Function Tests: Recent Labs  Lab 10/20/20 0601  AST 16  ALT 12  ALKPHOS 134*  BILITOT 0.8  PROT 7.4  ALBUMIN 3.5   No results for input(s): LIPASE, AMYLASE in the last 168 hours. No results for input(s): AMMONIA in the last 168 hours. Coagulation Profile: No results for input(s): INR, PROTIME in the last 168 hours. Cardiac Enzymes: No results for input(s): CKTOTAL, CKMB, CKMBINDEX, TROPONINI in the last 168 hours. BNP (last 3 results) No results for input(s): PROBNP in the last 8760 hours. HbA1C: Recent Labs    10/20/20 0603  HGBA1C 6.9*   CBG: Recent Labs  Lab 10/20/20 1328 10/20/20 1642 10/20/20 2309 10/21/20 0812 10/21/20 1011  GLUCAP 366* 275* 336* 187* 257*   Lipid Profile: No results for input(s): CHOL, HDL, LDLCALC, TRIG, CHOLHDL, LDLDIRECT in the last 72 hours. Thyroid Function Tests: Recent Labs     10/21/20 0228  TSH 0.353   Anemia Panel: No results for input(s): VITAMINB12, FOLATE, FERRITIN, TIBC, IRON, RETICCTPCT in the last 72 hours. Urine analysis: No results found for: COLORURINE, APPEARANCEUR, LABSPEC, PHURINE, GLUCOSEU, HGBUR, BILIRUBINUR, KETONESUR, PROTEINUR, UROBILINOGEN, NITRITE, LEUKOCYTESUR Sepsis Labs: @LABRCNTIP (procalcitonin:4,lacticacidven:4)  ) Recent Results (from the past 240 hour(s))  Resp Panel by RT-PCR (Flu A&B, Covid) Nasopharyngeal Swab     Status: None   Collection Time: 10/19/20 11:31 PM   Specimen: Nasopharyngeal Swab; Nasopharyngeal(NP) swabs in vial transport medium  Result Value Ref Range Status   SARS Coronavirus 2 by RT PCR NEGATIVE NEGATIVE Final  Comment: (NOTE) SARS-CoV-2 target nucleic acids are NOT DETECTED.  The SARS-CoV-2 RNA is generally detectable in upper respiratory specimens during the acute phase of infection. The lowest concentration of SARS-CoV-2 viral copies this assay can detect is 138 copies/mL. A negative result does not preclude SARS-Cov-2 infection and should not be used as the sole basis for treatment or other patient management decisions. A negative result may occur with  improper specimen collection/handling, submission of specimen other than nasopharyngeal swab, presence of viral mutation(s) within the areas targeted by this assay, and inadequate number of viral copies(<138 copies/mL). A negative result must be combined with clinical observations, patient history, and epidemiological information. The expected result is Negative.  Fact Sheet for Patients:  BloggerCourse.com  Fact Sheet for Healthcare Providers:  SeriousBroker.it  This test is no t yet approved or cleared by the [place] FDA and  has been authorized for detection and/or diagnosis of SARS-CoV-2 by FDA under an Emergency Use Authorization (EUA). This EUA will remain  in effect (meaning this  test can be used) for the duration of the COVID-19 declaration under Section 564(b)(1) of the Act, 21 U.S.C.section 360bbb-3(b)(1), unless the authorization is terminated  or revoked sooner.       Influenza A by PCR NEGATIVE NEGATIVE Final   Influenza B by PCR NEGATIVE NEGATIVE Final    Comment: (NOTE) The Xpert Xpress SARS-CoV-2/FLU/RSV plus assay is intended as an aid in the diagnosis of influenza from Nasopharyngeal swab specimens and should not be used as a sole basis for treatment. Nasal washings and aspirates are unacceptable for Xpert Xpress SARS-CoV-2/FLU/RSV testing.  Fact Sheet for Patients: BloggerCourse.com  Fact Sheet for Healthcare Providers: SeriousBroker.it  This test is not yet approved or cleared by the [place] FDA and has been authorized for detection and/or diagnosis of SARS-CoV-2 by FDA under an Emergency Use Authorization (EUA). This EUA will remain in effect (meaning this test can be used) for the duration of the COVID-19 declaration under Section 564(b)(1) of the Act, 21 U.S.C. section 360bbb-3(b)(1), unless the authorization is terminated or revoked.  Performed at [facility], [address], [place], [place] [address]   Blood culture (routine x 2)     Status: None (Preliminary result)   Collection Time: 10/20/20 12:59 AM   Specimen: BLOOD RIGHT WRIST  Result Value Ref Range Status   Specimen Description BLOOD RIGHT WRIST  Final   Special Requests   Final    BOTTLES DRAWN AEROBIC AND ANAEROBIC Blood Culture adequate volume   Culture   Final    NO GROWTH 1 DAY Performed at [facility], [address], [place], [place] [address]    Report Status PENDING  Incomplete  Blood culture (routine x 2)     Status: None (Preliminary result)   Collection Time: 10/20/20 12:59 AM   Specimen: BLOOD LEFT WRIST  Result Value Ref Range Status   Specimen Description BLOOD LEFT WRIST  Final    Special Requests   Final    BOTTLES DRAWN AEROBIC AND ANAEROBIC Blood Culture results may not be optimal due to an inadequate volume of blood received in culture bottles   Culture   Final    NO GROWTH 1 DAY Performed at [facility], [address] [place], [place] [address]    Report Status PENDING  Incomplete  Respiratory Panel by PCR     Status: None   Collection Time: 10/20/20  9:43 AM   Specimen: Nasopharyngeal Swab;  Respiratory  Result Value Ref Range Status   Adenovirus NOT DETECTED NOT DETECTED Final   Coronavirus 229E NOT DETECTED NOT DETECTED Final    Comment: (NOTE) The Coronavirus on the Respiratory Panel, DOES NOT test for the novel  Coronavirus (2019 nCoV)    Coronavirus HKU1 NOT DETECTED NOT DETECTED Final   Coronavirus NL63 NOT DETECTED NOT DETECTED Final   Coronavirus OC43 NOT DETECTED NOT DETECTED Final   Metapneumovirus NOT DETECTED NOT DETECTED Final   Rhinovirus / Enterovirus NOT DETECTED NOT DETECTED Final   Influenza A NOT DETECTED NOT DETECTED Final   Influenza B NOT DETECTED NOT DETECTED Final   Parainfluenza Virus 1 NOT DETECTED NOT DETECTED Final   Parainfluenza Virus 2 NOT DETECTED NOT DETECTED Final   Parainfluenza Virus 3 NOT DETECTED NOT DETECTED Final   Parainfluenza Virus 4 NOT DETECTED NOT DETECTED Final   Respiratory Syncytial Virus NOT DETECTED NOT DETECTED Final   Bordetella pertussis NOT DETECTED NOT DETECTED Final   Chlamydophila pneumoniae NOT DETECTED NOT DETECTED Final   Mycoplasma pneumoniae NOT DETECTED NOT DETECTED Final    Comment: Performed at [facility], [address] [place], [place] [address]         Radiology Studies: DG Chest Portable 1 View  Result Date: 10/19/2020 CLINICAL DATA:  Cough and shortness of breath. Weakness. Worse tonight. EXAM: PORTABLE CHEST 1 VIEW COMPARISON:  Chest x-ray 09/14/2018, chest x-ray 08/12/2016. FINDINGS: The heart size and mediastinal contours are unchanged with enlarged  cardiac silhouette. Aortic arch calcifications. Similar-appearing (as far back as chest x-ray 08/12/2016) right paratracheal stripe thickening measuring 2.6 x 5.5 cm. Linear atelectasis within the left mid lung zone. Bibasilar hazy airspace opacity. No pulmonary edema. No pleural effusion. No pneumothorax. No acute osseous abnormality. Suggestion of calcific tendinosis of the left shoulder. IMPRESSION: Bibasilar hazy airspace opacity that could represent a combination of atelectasis versus infection/inflammation. Electronically Signed   By: Tish FredericksonMorgane  Naveau M.D.   On: 10/19/2020 23:49        Scheduled Meds: . azithromycin  250 mg Oral QHS  . citalopram  20 mg Oral Daily  . diltiazem  360 mg Oral Daily  . enoxaparin (LOVENOX) injection  40 mg Subcutaneous Q24H  . insulin aspart  0-15 Units Subcutaneous TID AC & HS  . insulin aspart  0-5 Units Subcutaneous QHS  . insulin glargine  8 Units Subcutaneous QHS  . ipratropium-albuterol  3 mL Nebulization BID  . lisinopril  20 mg Oral Daily  . metoprolol tartrate  25 mg Oral BID  . [START ON 10/22/2020] pneumococcal 23 valent vaccine  0.5 mL Intramuscular Tomorrow-1000  . [START ON 10/22/2020] predniSONE  40 mg Oral Q breakfast   Continuous Infusions:    LOS: 1 day    Time spent: 25 minutes    Alberteen Samhristopher P Jaquana Geiger, MD [facility] 10/21/2020, 3:50 PM     Please page though AMION or Epic secure chat:  For password, contact charge nurse

## 2020-10-22 DIAGNOSIS — J441 Chronic obstructive pulmonary disease with (acute) exacerbation: Principal | ICD-10-CM

## 2020-10-22 DIAGNOSIS — I4821 Permanent atrial fibrillation: Secondary | ICD-10-CM

## 2020-10-22 DIAGNOSIS — J9611 Chronic respiratory failure with hypoxia: Secondary | ICD-10-CM

## 2020-10-22 LAB — CBC
HCT: 38.2 % (ref 36.0–46.0)
Hemoglobin: 11.5 g/dL — ABNORMAL LOW (ref 12.0–15.0)
MCH: 25.1 pg — ABNORMAL LOW (ref 26.0–34.0)
MCHC: 30.1 g/dL (ref 30.0–36.0)
MCV: 83.2 fL (ref 80.0–100.0)
Platelets: 391 10*3/uL (ref 150–400)
RBC: 4.59 MIL/uL (ref 3.87–5.11)
RDW: 17.3 % — ABNORMAL HIGH (ref 11.5–15.5)
WBC: 17.3 10*3/uL — ABNORMAL HIGH (ref 4.0–10.5)
nRBC: 0 % (ref 0.0–0.2)

## 2020-10-22 LAB — BASIC METABOLIC PANEL
Anion gap: 14 (ref 5–15)
BUN: 38 mg/dL — ABNORMAL HIGH (ref 8–23)
CO2: 23 mmol/L (ref 22–32)
Calcium: 10.2 mg/dL (ref 8.9–10.3)
Chloride: 99 mmol/L (ref 98–111)
Creatinine, Ser: 1.12 mg/dL — ABNORMAL HIGH (ref 0.44–1.00)
GFR, Estimated: 49 mL/min — ABNORMAL LOW (ref >60)
Glucose, Bld: 245 mg/dL — ABNORMAL HIGH (ref 70–99)
Potassium: 4.6 mmol/L (ref 3.5–5.1)
Sodium: 136 mmol/L (ref 135–145)

## 2020-10-22 LAB — GLUCOSE, CAPILLARY
Glucose-Capillary: 190 mg/dL — ABNORMAL HIGH (ref 70–99)
Glucose-Capillary: 348 mg/dL — ABNORMAL HIGH (ref 70–99)
Glucose-Capillary: 202 mg/dL — ABNORMAL HIGH (ref 70–99)
Glucose-Capillary: 265 mg/dL — ABNORMAL HIGH (ref 70–99)

## 2020-10-22 MED ORDER — POLYETHYLENE GLYCOL 3350 17 G PO PACK
17.0000 g | PACK | Freq: Every day | ORAL | Status: DC
  Administered 2020-10-22 – 2020-10-24 (×2): 17 g via ORAL
  Filled 2020-10-22 (×2): qty 1

## 2020-10-22 MED ORDER — PREDNISONE 20 MG PO TABS
40.0000 mg | ORAL_TABLET | Freq: Every day | ORAL | Status: DC
  Administered 2020-10-23 – 2020-10-24 (×2): 40 mg via ORAL
  Filled 2020-10-22 (×2): qty 2

## 2020-10-22 MED ORDER — SENNA 8.6 MG PO TABS
2.0000 | ORAL_TABLET | Freq: Every day | ORAL | Status: DC
  Administered 2020-10-22 – 2020-10-23 (×2): 17.2 mg via ORAL
  Filled 2020-10-22 (×2): qty 2

## 2020-10-22 NOTE — Progress Notes (Signed)
PROGRESS NOTE    Andrea [name]  IFO:[id number] DOB: June 17, 1941 DOA: 10/19/2020 PCP: Ignatius Specking, MD    Brief Narrative:  Andrea [name] is a 79 y.o. F with hx AF not on AC due to hx GIB, anemia, DM, HTN, obesity BMI 40, and PAH, OHS, OSA not on CPAP, PAH, and COPD with chronic respiratory failure on 3L who presents with worsening SOB for few weeks, now severe.  Over last 3 weeks, patient with progressive SOB on exertion, worsening until severe, associated with cough productive of yellow sputum and palpitations.  In the ER, CXR showed patchy bilateral infiltrates, Afib with rapid rate, and she was started on steroids, antibiotics and bronchodilators.       Assessment & Plan:   COPD with acute exacerbation Chronic respiratory failure with hypoxia Concern for pneumonia though more likely to be atelectasis Patient was treated with nebulizer treatment antibiotics and steroids.  She seems to be back to her baseline.  Continue current treatment for now.  Taper steroids.    Atrial fibrillation, chronic Atrial fibrillation with RVR Patient admitted with A. fib with rates in the 130s.  Started on diltiazem drip, rate controlled overnight, transitioned back to home diltiazem -Continue diltiazem Seems to be stable.  Obesity hypoventilation syndrome OSA not on CPAP Moderate pulmonary arterial hypertension Acute CHF ruled out Admitted on diuretics, appeared euvolemic.  Diuretics were discontinued.  Renal function is stable.  Chronic iron deficiency anemia due to chronic GI bleed Hemoglobin is stable.  Diabetes mellitus type 2, uncontrolled with hyperglycemia HbA1c 6.9.  Elevated glucose most likely due to steroids.  Continue Lantus and SSI.    Essential hypertension -Continue lisinopril, metoprolol -Trend BMP  Mood disorder -Continue citalopram  Leukocytosis Due to steroids.   DVT prophylaxis: Lovenox CODE STATUS: Full code Family communication: Discussed with  patient. Disposition: To SNF when bed is available.  Status is: Inpatient  Remains inpatient appropriate because:Inpatient level of care appropriate due to severity of illness   Dispo: The patient is from: Home              Anticipated d/c is to: SNF              Anticipated d/c date is: 2 days              Patient currently is medically stable to d/c.          Subjective: Patient states that she feels better than how she was a few days ago.  Denies any chest pain this morning.  Shortness of breath is improving.  Cough is improving.       Objective: Vitals:   10/21/20 2127 10/22/20 0453 10/22/20 0751 10/22/20 0859  BP: (!) 166/98 123/87  (!) 149/73  Pulse: 78 81    Resp: 20 20  18   Temp: 97.9 F (36.6 C) 97.9 F (36.6 C)  97.9 F (36.6 C)  TempSrc: Oral Oral  Oral  SpO2: 93% 97% 98% 94%    Intake/Output Summary (Last 24 hours) at 10/22/2020 1230 Last data filed at 10/22/2020 14/11/2019 Gross per 24 hour  Intake 200 ml  Output 300 ml  Net -100 ml   There were no vitals filed for this visit.  Examination:  General appearance: Awake alert.  In no distress Resp: Normal effort at rest.  Coarse breath sounds bilaterally with few crackles at the bases.  No wheezing or rhonchi.   Cardio: S1-S2 is normal regular.  No S3-S4.  No rubs murmurs  or bruit GI: Abdomen is soft.  Nontender nondistended.  Bowel sounds are present normal.  No masses organomegaly Extremities: Moving all extremities Neurologic: Alert and oriented x3.  No focal neurological deficits.       Data Reviewed: I have personally reviewed following labs and imaging studies:  CBC: Recent Labs  Lab 10/20/20 0106 10/20/20 0405 10/20/20 0601 10/21/20 0228 10/22/20 0138  WBC 12.1*  --  11.7* 15.4* 17.3*  NEUTROABS 10.0*  --  10.8*  --   --   HGB 11.5* 11.6* 11.4* 11.3* 11.5*  HCT 38.0 34.0* 37.6 36.7 38.2  MCV 83.5  --  83.4 81.4 83.2  PLT 307  --  298 348 391   Basic Metabolic Panel: Recent Labs   Lab 10/20/20 0106 10/20/20 0108 10/20/20 0405 10/20/20 0601 10/21/20 0228 10/22/20 0138  NA  --  139 140 137 136 136  K  --  3.6 3.2* 3.6 3.9 4.6  CL  --  101  --  98 98 99  CO2  --  22  --  22 23 23   GLUCOSE  --  184*  --  239* 231* 245*  BUN  --  8  --  8 27* 38*  CREATININE  --  0.83  --  0.83 1.12* 1.12*  CALCIUM  --  9.2  --  8.8* 10.1 10.2  MG 1.8  --   --  2.3  --   --    GFR: CrCl cannot be calculated (Unknown ideal weight.). Liver Function Tests: Recent Labs  Lab 10/20/20 0601  AST 16  ALT 12  ALKPHOS 134*  BILITOT 0.8  PROT 7.4  ALBUMIN 3.5   HbA1C: Recent Labs    10/20/20 0603  HGBA1C 6.9*   CBG: Recent Labs  Lab 10/21/20 1011 10/21/20 1614 10/21/20 2013 10/22/20 0814 10/22/20 1127  GLUCAP 257* 196* 226* 190* 348*   Thyroid Function Tests: Recent Labs    10/21/20 0228  TSH 0.353    Recent Results (from the past 240 hour(s))  Resp Panel by RT-PCR (Flu A&B, Covid) Nasopharyngeal Swab     Status: None   Collection Time: 10/19/20 11:31 PM   Specimen: Nasopharyngeal Swab; Nasopharyngeal(NP) swabs in vial transport medium  Result Value Ref Range Status   SARS Coronavirus 2 by RT PCR NEGATIVE NEGATIVE Final    Comment: (NOTE) SARS-CoV-2 target nucleic acids are NOT DETECTED.  The SARS-CoV-2 RNA is generally detectable in upper respiratory specimens during the acute phase of infection. The lowest concentration of SARS-CoV-2 viral copies this assay can detect is 138 copies/mL. A negative result does not preclude SARS-Cov-2 infection and should not be used as the sole basis for treatment or other patient management decisions. A negative result may occur with  improper specimen collection/handling, submission of specimen other than nasopharyngeal swab, presence of viral mutation(s) within the areas targeted by this assay, and inadequate number of viral copies(<138 copies/mL). A negative result must be combined with clinical observations,  patient history, and epidemiological information. The expected result is Negative.  Fact Sheet for Patients:  10/21/20  Fact Sheet for Healthcare Providers:  BloggerCourse.com  This test is no t yet approved or cleared by the [place] FDA and  has been authorized for detection and/or diagnosis of SARS-CoV-2 by FDA under an Emergency Use Authorization (EUA). This EUA will remain  in effect (meaning this test can be used) for the duration of the COVID-19 declaration under Section 564(b)(1) of the Act, 21 U.S.C.section 360bbb-3(b)(1),  unless the authorization is terminated  or revoked sooner.       Influenza A by PCR NEGATIVE NEGATIVE Final   Influenza B by PCR NEGATIVE NEGATIVE Final    Comment: (NOTE) The Xpert Xpress SARS-CoV-2/FLU/RSV plus assay is intended as an aid in the diagnosis of influenza from Nasopharyngeal swab specimens and should not be used as a sole basis for treatment. Nasal washings and aspirates are unacceptable for Xpert Xpress SARS-CoV-2/FLU/RSV testing.  Fact Sheet for Patients: BloggerCourse.comhttps://www.fda.gov/media/152166/download  Fact Sheet for Healthcare Providers: SeriousBroker.ithttps://www.fda.gov/media/152162/download  This test is not yet approved or cleared by the [place] FDA and has been authorized for detection and/or diagnosis of SARS-CoV-2 by FDA under an Emergency Use Authorization (EUA). This EUA will remain in effect (meaning this test can be used) for the duration of the COVID-19 declaration under Section 564(b)(1) of the Act, 21 U.S.C. section 360bbb-3(b)(1), unless the authorization is terminated or revoked.  Performed at [facility], [address] [place], [place] [address]   Blood culture (routine x 2)     Status: None (Preliminary result)   Collection Time: 10/20/20 12:59 AM   Specimen: BLOOD RIGHT WRIST  Result Value Ref Range Status   Specimen Description BLOOD RIGHT WRIST   Final   Special Requests   Final    BOTTLES DRAWN AEROBIC AND ANAEROBIC Blood Culture adequate volume   Culture   Final    NO GROWTH 2 DAYS Performed at [facility], [address] [place], [place] [address]    Report Status PENDING  Incomplete  Blood culture (routine x 2)     Status: None (Preliminary result)   Collection Time: 10/20/20 12:59 AM   Specimen: BLOOD LEFT WRIST  Result Value Ref Range Status   Specimen Description BLOOD LEFT WRIST  Final   Special Requests   Final    BOTTLES DRAWN AEROBIC AND ANAEROBIC Blood Culture results may not be optimal due to an inadequate volume of blood received in culture bottles   Culture   Final    NO GROWTH 2 DAYS Performed at [facility], [address] [place], [place] [address]    Report Status PENDING  Incomplete  Respiratory Panel by PCR     Status: None   Collection Time: 10/20/20  9:43 AM   Specimen: Nasopharyngeal Swab; Respiratory  Result Value Ref Range Status   Adenovirus NOT DETECTED NOT DETECTED Final   Coronavirus 229E NOT DETECTED NOT DETECTED Final    Comment: (NOTE) The Coronavirus on the Respiratory Panel, DOES NOT test for the novel  Coronavirus (2019 nCoV)    Coronavirus HKU1 NOT DETECTED NOT DETECTED Final   Coronavirus NL63 NOT DETECTED NOT DETECTED Final   Coronavirus OC43 NOT DETECTED NOT DETECTED Final   Metapneumovirus NOT DETECTED NOT DETECTED Final   Rhinovirus / Enterovirus NOT DETECTED NOT DETECTED Final   Influenza A NOT DETECTED NOT DETECTED Final   Influenza B NOT DETECTED NOT DETECTED Final   Parainfluenza Virus 1 NOT DETECTED NOT DETECTED Final   Parainfluenza Virus 2 NOT DETECTED NOT DETECTED Final   Parainfluenza Virus 3 NOT DETECTED NOT DETECTED Final   Parainfluenza Virus 4 NOT DETECTED NOT DETECTED Final   Respiratory Syncytial Virus NOT DETECTED NOT DETECTED Final   Bordetella pertussis NOT DETECTED NOT DETECTED Final   Chlamydophila pneumoniae NOT DETECTED NOT DETECTED  Final   Mycoplasma pneumoniae NOT DETECTED NOT DETECTED Final    Comment: Performed at [facility],  [address], [place], [place] [address]         Radiology Studies: No results found.      Scheduled Meds: . azithromycin  250 mg Oral QHS  . citalopram  20 mg Oral Daily  . diltiazem  360 mg Oral Daily  . enoxaparin (LOVENOX) injection  40 mg Subcutaneous Q24H  . insulin aspart  0-15 Units Subcutaneous TID AC  . insulin aspart  0-5 Units Subcutaneous QHS  . insulin glargine  20 Units Subcutaneous QHS  . ipratropium-albuterol  3 mL Nebulization BID  . lisinopril  20 mg Oral Daily  . metoprolol tartrate  25 mg Oral BID  . polyethylene glycol  17 g Oral Daily  . predniSONE  40 mg Oral Q breakfast  . senna  2 tablet Oral QHS   Continuous Infusions:    LOS: 2 days     Osvaldo Shipper, MD [facility] 10/22/2020, 12:30 PM     Please page though AMION:  For password, contact charge nurse

## 2020-10-22 NOTE — Progress Notes (Signed)
Received patient for care around 1900 from off-going nurse. MAR, chart, and labs reviewed. Patient alert and oriented x4. Patient's oxygen saturation remained above 92% on room air. Skin assessed with no new skin issues noted. Patient able to turn and reposition self in bed. Patient continent x2. Vitals stable this shift. Call light in reach, bed in low position, wheels locked. No acute events this shift. Will continue to monitor for signs and symptoms of deterioration and report as needed.

## 2020-10-22 NOTE — Progress Notes (Signed)
10/22/2020 pneumonia vaccine given in left deltoid at 0948. Lot #[id number] and expiration date September 05, 2021. Lovie Macadamia RN

## 2020-10-22 NOTE — Plan of Care (Signed)

## 2020-10-22 NOTE — Progress Notes (Signed)
Inpatient Diabetes Program Recommendations  AACE/ADA: New Consensus Statement on Inpatient Glycemic Control (2015)  Target Ranges:  Prepandial:   less than 140 mg/dL      Peak postprandial:   less than 180 mg/dL (1-2 hours)      Critically ill patients:  140 - 180 mg/dL   Lab Results  Component Value Date   GLUCAP 348 (H) 10/22/2020   HGBA1C 6.9 (H) 10/20/2020    Review of Glycemic Control Results for Andrea [name], Andrea [name] (MRN [id number]) as of 10/22/2020 15:08  Ref. Range 10/21/2020 10:11 10/21/2020 16:14 10/21/2020 20:13 10/22/2020 08:14 10/22/2020 11:27  Glucose-Capillary Latest Ref Range: 70 - 99 mg/dL 235 (H) 361 (H) 443 (H) 190 (H) 348 (H)  Diabetes history: Type 2 DM Outpatient Diabetes medications: none Current orders for Inpatient glycemic control: Lantus 20 units QHS, Novolog 0-15 units TID, Novolog 0-5 units QHS, Prednisone 40 mg QD Inpatient Diabetes Program Recommendations:   Please add Novolog 3 units tid with meals (hold if patient eats less than 50%).   Thanks,  Beryl Meager, RN, BC-ADM Inpatient Diabetes Coordinator Pager [phone] (8a-5p)

## 2020-10-23 ENCOUNTER — Encounter (HOSPITAL_COMMUNITY): Payer: Self-pay | Admitting: Family Medicine

## 2020-10-23 ENCOUNTER — Other Ambulatory Visit: Payer: Self-pay

## 2020-10-23 DIAGNOSIS — J441 Chronic obstructive pulmonary disease with (acute) exacerbation: Secondary | ICD-10-CM | POA: Diagnosis not present

## 2020-10-23 DIAGNOSIS — J9611 Chronic respiratory failure with hypoxia: Secondary | ICD-10-CM | POA: Diagnosis not present

## 2020-10-23 DIAGNOSIS — I4821 Permanent atrial fibrillation: Secondary | ICD-10-CM | POA: Diagnosis not present

## 2020-10-23 LAB — GLUCOSE, CAPILLARY
Glucose-Capillary: 142 mg/dL — ABNORMAL HIGH (ref 70–99)
Glucose-Capillary: 227 mg/dL — ABNORMAL HIGH (ref 70–99)
Glucose-Capillary: 308 mg/dL — ABNORMAL HIGH (ref 70–99)
Glucose-Capillary: 300 mg/dL — ABNORMAL HIGH (ref 70–99)

## 2020-10-23 LAB — SARS CORONAVIRUS 2 BY RT PCR (HOSPITAL ORDER, PERFORMED IN ~~LOC~~ HOSPITAL LAB): SARS Coronavirus 2: NEGATIVE

## 2020-10-23 NOTE — Progress Notes (Signed)
PROGRESS NOTE    Andrea [name]  YWV:[id number] DOB: 01/13/1941 DOA: 10/19/2020 PCP: Ignatius Specking, MD    Brief Narrative:  Andrea [name] is a 79 y.o. F with hx AF not on AC due to hx GIB, anemia, DM, HTN, obesity BMI 40, and PAH, OHS, OSA not on CPAP, PAH, and COPD with chronic respiratory failure on 3L who presents with worsening SOB for few weeks, now severe.  Over last 3 weeks, patient with progressive SOB on exertion, worsening until severe, associated with cough productive of yellow sputum and palpitations.  In the ER, CXR showed patchy bilateral infiltrates, Afib with rapid rate, and she was started on steroids, antibiotics and bronchodilators.       Assessment & Plan:   COPD with acute exacerbation Chronic respiratory failure with hypoxia Concern for pneumonia though more likely to be atelectasis Patient was treated with nebulizer treatment antibiotics and steroids.   Proved.  Back to her baseline.  Continue to taper steroids.   Patient mentions that she uses oxygen at home mainly at nighttime at 3 L/min.  Atrial fibrillation, chronic Atrial fibrillation with RVR Patient admitted with A. fib with rates in the 130s.  Started on diltiazem drip, rate controlled overnight, transitioned back to home diltiazem.  Seems to be stable at this time.  Obesity hypoventilation syndrome OSA not on CPAP Moderate pulmonary arterial hypertension Acute CHF ruled out Admitted on diuretics, appeared euvolemic.  Diuretics were discontinued.  Renal function is stable.  Chronic iron deficiency anemia due to chronic GI bleed No evidence of overt bleeding.  Hemoglobin has been stable.  Diabetes mellitus type 2, uncontrolled with hyperglycemia HbA1c 6.9.  Elevated glucose most likely due to steroids.  Continue Lantus and SSI.    Essential hypertension Trolled with occasional high readings.  Continue lisinopril and metoprolol.    Mood disorder Continue citalopram  Leukocytosis Due to  steroids.   DVT prophylaxis: Lovenox CODE STATUS: Full code Family communication: Discussed with patient. Disposition: To SNF when bed is available.  Status is: Inpatient  Remains inpatient appropriate because:Inpatient level of care appropriate due to severity of illness   Dispo: The patient is from: Home              Anticipated d/c is to: SNF              Anticipated d/c date is: 2 days              Patient currently is medically stable to d/c.          Subjective: Patient mentions that she is feeling better.  Occasionally she does get a dry cough.  Denies any chest pain.  No nausea vomiting.     Objective: Vitals:   10/22/20 2058 10/23/20 0500 10/23/20 0846 10/23/20 0939  BP: 139/69 (!) 159/101  (!) 150/121  Pulse: 69 67 73 96  Resp: 19 20 18    Temp: 98.2 F (36.8 C) 97.6 F (36.4 C)    TempSrc: Oral Oral    SpO2: 96% 97%     No intake or output data in the 24 hours ending 10/23/20 1148 There were no vitals filed for this visit.  Examination:  General appearance: Awake alert.  In no distress Resp: Normal effort at rest.  Coarse breath sound bilaterally with few crackles at the bases.  No wheezing or rhonchi. Cardio: S1-S2 is normal regular.  No S3-S4.  No rubs murmurs or bruit GI: Abdomen is soft.  Nontender nondistended.  Bowel sounds are present normal.  No masses organomegaly Extremities: No edema.  Full range of motion of lower extremities. Neurologic: Alert and oriented x3.  No focal neurological deficits.         Data Reviewed: I have personally reviewed following labs and imaging studies:  CBC: Recent Labs  Lab 10/20/20 0106 10/20/20 0405 10/20/20 0601 10/21/20 0228 10/22/20 0138  WBC 12.1*  --  11.7* 15.4* 17.3*  NEUTROABS 10.0*  --  10.8*  --   --   HGB 11.5* 11.6* 11.4* 11.3* 11.5*  HCT 38.0 34.0* 37.6 36.7 38.2  MCV 83.5  --  83.4 81.4 83.2  PLT 307  --  298 348 391   Basic Metabolic Panel: Recent Labs  Lab 10/20/20 0106  10/20/20 0108 10/20/20 0405 10/20/20 0601 10/21/20 0228 10/22/20 0138  NA  --  139 140 137 136 136  K  --  3.6 3.2* 3.6 3.9 4.6  CL  --  101  --  98 98 99  CO2  --  22  --  22 23 23   GLUCOSE  --  184*  --  239* 231* 245*  BUN  --  8  --  8 27* 38*  CREATININE  --  0.83  --  0.83 1.12* 1.12*  CALCIUM  --  9.2  --  8.8* 10.1 10.2  MG 1.8  --   --  2.3  --   --    GFR: CrCl cannot be calculated (Unknown ideal weight.). Liver Function Tests: Recent Labs  Lab 10/20/20 0601  AST 16  ALT 12  ALKPHOS 134*  BILITOT 0.8  PROT 7.4  ALBUMIN 3.5   CBG: Recent Labs  Lab 10/22/20 1127 10/22/20 1610 10/22/20 2055 10/23/20 0730 10/23/20 1134  GLUCAP 348* 202* 265* 142* 227*   Thyroid Function Tests: Recent Labs    10/21/20 0228  TSH 0.353    Recent Results (from the past 240 hour(s))  Resp Panel by RT-PCR (Flu A&B, Covid) Nasopharyngeal Swab     Status: None   Collection Time: 10/19/20 11:31 PM   Specimen: Nasopharyngeal Swab; Nasopharyngeal(NP) swabs in vial transport medium  Result Value Ref Range Status   SARS Coronavirus 2 by RT PCR NEGATIVE NEGATIVE Final    Comment: (NOTE) SARS-CoV-2 target nucleic acids are NOT DETECTED.  The SARS-CoV-2 RNA is generally detectable in upper respiratory specimens during the acute phase of infection. The lowest concentration of SARS-CoV-2 viral copies this assay can detect is 138 copies/mL. A negative result does not preclude SARS-Cov-2 infection and should not be used as the sole basis for treatment or other patient management decisions. A negative result may occur with  improper specimen collection/handling, submission of specimen other than nasopharyngeal swab, presence of viral mutation(s) within the areas targeted by this assay, and inadequate number of viral copies(<138 copies/mL). A negative result must be combined with clinical observations, patient history, and epidemiological information. The expected result is  Negative.  Fact Sheet for Patients:  10/21/20  Fact Sheet for Healthcare Providers:  BloggerCourse.com  This test is no t yet approved or cleared by the [place] FDA and  has been authorized for detection and/or diagnosis of SARS-CoV-2 by FDA under an Emergency Use Authorization (EUA). This EUA will remain  in effect (meaning this test can be used) for the duration of the COVID-19 declaration under Section 564(b)(1) of the Act, 21 U.S.C.section 360bbb-3(b)(1), unless the authorization is terminated  or revoked sooner.  Influenza A by PCR NEGATIVE NEGATIVE Final   Influenza B by PCR NEGATIVE NEGATIVE Final    Comment: (NOTE) The Xpert Xpress SARS-CoV-2/FLU/RSV plus assay is intended as an aid in the diagnosis of influenza from Nasopharyngeal swab specimens and should not be used as a sole basis for treatment. Nasal washings and aspirates are unacceptable for Xpert Xpress SARS-CoV-2/FLU/RSV testing.  Fact Sheet for Patients: BloggerCourse.comhttps://www.fda.gov/media/152166/download  Fact Sheet for Healthcare Providers: SeriousBroker.ithttps://www.fda.gov/media/152162/download  This test is not yet approved or cleared by the [place] FDA and has been authorized for detection and/or diagnosis of SARS-CoV-2 by FDA under an Emergency Use Authorization (EUA). This EUA will remain in effect (meaning this test can be used) for the duration of the COVID-19 declaration under Section 564(b)(1) of the Act, 21 U.S.C. section 360bbb-3(b)(1), unless the authorization is terminated or revoked.  Performed at [facility], [address] [place], [place] [address]   Blood culture (routine x 2)     Status: None (Preliminary result)   Collection Time: 10/20/20 12:59 AM   Specimen: BLOOD RIGHT WRIST  Result Value Ref Range Status   Specimen Description BLOOD RIGHT WRIST  Final   Special Requests   Final    BOTTLES DRAWN AEROBIC AND ANAEROBIC  Blood Culture adequate volume   Culture   Final    NO GROWTH 3 DAYS Performed at [facility], [address] [place], [place] [address]    Report Status PENDING  Incomplete  Blood culture (routine x 2)     Status: None (Preliminary result)   Collection Time: 10/20/20 12:59 AM   Specimen: BLOOD LEFT WRIST  Result Value Ref Range Status   Specimen Description BLOOD LEFT WRIST  Final   Special Requests   Final    BOTTLES DRAWN AEROBIC AND ANAEROBIC Blood Culture results may not be optimal due to an inadequate volume of blood received in culture bottles   Culture   Final    NO GROWTH 3 DAYS Performed at [facility], [address] [place], [place] [address]    Report Status PENDING  Incomplete  Respiratory Panel by PCR     Status: None   Collection Time: 10/20/20  9:43 AM   Specimen: Nasopharyngeal Swab; Respiratory  Result Value Ref Range Status   Adenovirus NOT DETECTED NOT DETECTED Final   Coronavirus 229E NOT DETECTED NOT DETECTED Final    Comment: (NOTE) The Coronavirus on the Respiratory Panel, DOES NOT test for the novel  Coronavirus (2019 nCoV)    Coronavirus HKU1 NOT DETECTED NOT DETECTED Final   Coronavirus NL63 NOT DETECTED NOT DETECTED Final   Coronavirus OC43 NOT DETECTED NOT DETECTED Final   Metapneumovirus NOT DETECTED NOT DETECTED Final   Rhinovirus / Enterovirus NOT DETECTED NOT DETECTED Final   Influenza A NOT DETECTED NOT DETECTED Final   Influenza B NOT DETECTED NOT DETECTED Final   Parainfluenza Virus 1 NOT DETECTED NOT DETECTED Final   Parainfluenza Virus 2 NOT DETECTED NOT DETECTED Final   Parainfluenza Virus 3 NOT DETECTED NOT DETECTED Final   Parainfluenza Virus 4 NOT DETECTED NOT DETECTED Final   Respiratory Syncytial Virus NOT DETECTED NOT DETECTED Final   Bordetella pertussis NOT DETECTED NOT DETECTED Final   Chlamydophila pneumoniae NOT DETECTED NOT DETECTED Final   Mycoplasma pneumoniae NOT DETECTED NOT DETECTED Final    Comment:  Performed at [facility], [address] [place], [place] [address]  Radiology Studies: No results found.      Scheduled Meds: . azithromycin  250 mg Oral QHS  . citalopram  20 mg Oral Daily  . diltiazem  360 mg Oral Daily  . enoxaparin (LOVENOX) injection  40 mg Subcutaneous Q24H  . insulin aspart  0-15 Units Subcutaneous TID AC  . insulin aspart  0-5 Units Subcutaneous QHS  . insulin glargine  20 Units Subcutaneous QHS  . ipratropium-albuterol  3 mL Nebulization BID  . lisinopril  20 mg Oral Daily  . metoprolol tartrate  25 mg Oral BID  . polyethylene glycol  17 g Oral Daily  . predniSONE  40 mg Oral Q breakfast  . senna  2 tablet Oral QHS   Continuous Infusions:    LOS: 3 days     Osvaldo Shipper, MD [facility] 10/23/2020, 11:48 AM     Please page though AMION:  For password, contact charge nurse

## 2020-10-23 NOTE — Progress Notes (Signed)
Re: Andrea [name] DOB: 1941/04/12 Date: 12/2/20221   To Whom It May Concern:  Please be advised that the above-named patient will require a short-term nursing home stay--anticipated 30 days or less for rehabilitation and strengthening. The plan is for home.

## 2020-10-23 NOTE — TOC Initial Note (Signed)
Transition of Care [facility]) - Initial/Assessment Note    Patient Details  Name: Andrea [name] MRN: [id number] Date of Birth: Apr 17, 1941  Transition of Care [facility]) CM/SW Contact:    Kermit Balo, RN Phone Number: 10/23/2020, 4:18 PM  Clinical Narrative:                 Pt was agreeable to SNF rehab per prior note from CM.  Today CM provided her the bed offers and pt has yet to pick a SNF rehab. CM will follow up with her in the am. She is aware the plan is to d/c tomorrow. TOC following.  Expected Discharge Plan: Skilled Nursing Facility Barriers to Discharge: Continued Medical Work up   Patient Goals and CMS Choice   CMS Medicare.gov Compare Post Acute Care list provided to:: Patient Choice offered to / list presented to : Patient  Expected Discharge Plan and Services Expected Discharge Plan: Skilled Nursing Facility In-house Referral: Clinical Social Work Discharge Planning Services: CM Consult Post Acute Care Choice: Skilled Nursing Facility Living arrangements for the past 2 months: Single Family Home                                      Prior Living Arrangements/Services Living arrangements for the past 2 months: Single Family Home Lives with:: Relatives Patient language and need for interpreter reviewed:: Yes Do you feel safe going back to the place where you live?: Yes      Need for Family Participation in Patient Care: Yes (Comment) Care giver support system in place?: No (comment)   Criminal Activity/Legal Involvement Pertinent to Current Situation/Hospitalization: No - Comment as needed  Activities of Daily Living Home Assistive Devices/Equipment: Eyeglasses, Dan Humphreys (specify type) ADL Screening (condition at time of admission) Patient's cognitive ability adequate to safely complete daily activities?: Yes Is the patient deaf or have difficulty hearing?: No Does the patient have difficulty seeing, even when wearing glasses/contacts?: No Does the  patient have difficulty concentrating, remembering, or making decisions?: No Patient able to express need for assistance with ADLs?: Yes Does the patient have difficulty dressing or bathing?: No Independently performs ADLs?: Yes (appropriate for developmental age) Does the patient have difficulty walking or climbing stairs?: Yes Weakness of Legs: Both Weakness of Arms/Hands: None  Permission Sought/Granted                  Emotional Assessment Appearance:: Appears stated age Attitude/Demeanor/Rapport: Engaged Affect (typically observed): Accepting Orientation: : Oriented to Self, Oriented to Place, Oriented to  Time, Oriented to Situation   Psych Involvement: No (comment)  Admission diagnosis:  COPD exacerbation (HCC) [J44.1] Atrial fibrillation with RVR (HCC) [I48.91] Pneumonia of both lower lobes due to infectious organism [J18.9] Community acquired pneumonia, bilateral [J18.9] Patient Active Problem List   Diagnosis Date Noted  . Hyperlipidemia 10/20/2020  . Chronic respiratory failure with hypoxia (HCC) 10/20/2020  . COPD with acute exacerbation (HCC) 10/20/2020  . Prediabetes 10/20/2020  . COPD exacerbation (HCC) 10/20/2020  . Anxiety 08/12/2016  . Permanent atrial fibrillation with rapid ventricular response (HCC) 08/12/2016  . History of iron deficiency anemia 08/12/2016  . History of GI bleed 08/12/2016  . Dyspnea   . Left ventricular hypertrophy 10/07/2014  . Sleep apnea 07/08/2014  . Iron deficiency anemia due to chronic blood loss 03/25/2014  . Palpitations 11/02/2012  . Exertional dyspnea   . Essential hypertension  PCP:  Ignatius Specking, MD Pharmacy:   [id number] - [place], [place] - [address] [place] [address] Phone: [phone] Fax: [phone]  [facility] [phone] - [place] [place] - [address] AT [address] [place] [address] Phone: [phone] Fax:  [phone]     Social Determinants of Health (SDOH) Interventions    Readmission Risk Interventions No flowsheet data found.

## 2020-10-23 NOTE — Progress Notes (Addendum)
Physical Therapy Treatment Patient Details Name: Andrea [name] MRN: [id number] DOB: Oct 11, 1941 Today's Date: 10/23/2020    History of Present Illness 79yo female c/o SOB progressive over past 3 weeks. Found to be in A-fib with RVR in the ED, covid negative. Admitted with A-fib with RVR, COPD exacerbation. PMH anxiety, DOE, HLD, HTN, LV hypertrophy, tobacco abuse    PT Comments    Pt tolerated increased ambulation distance of 55' with RW, no loss of balance, 2/4 dyspnea with walking, pulse oximeter did not give a reading during ambulation. After 2-3 minutes seated rest, SaO2 98% on room air, HR 80. Instructed pt in seated BUE/LE strengthening exercises.  Follow Up Recommendations  SNF;Supervision/Assistance - 24 hour     Equipment Recommendations  Rolling walker with 5" wheels;3in1 (PT);Other (comment) (shower bench)    Recommendations for Other Services       Precautions / Restrictions Precautions Precautions: Fall Precaution Comments: pt reports h/o multiple falls in past 1 year Restrictions Weight Bearing Restrictions: No    Mobility  Bed Mobility               General bed mobility comments: up in recliner  Transfers Overall transfer level: Needs assistance Equipment used: Rolling walker (2 wheeled) Transfers: Sit to/from Stand Sit to Stand: Min assist         General transfer comment: VCs hand placement and for safe placement of RW,  min A to power up  Ambulation/Gait Ambulation/Gait assistance: Min guard Gait Distance (Feet): 70 Feet Assistive device: Rolling walker (2 wheeled) Gait Pattern/deviations: Step-through pattern;Decreased stride length Gait velocity: decreased   General Gait Details: steady with RW, VCs to step closer to RW, 2/4 dyspnea, pulse ox did not give a reading while ambulating, ~2-3 minutes after walking SaO2 98% on RA, HR 80, distance limited by fatigue VCs to stay close to RW.   Stairs             Wheelchair Mobility     Modified Rankin (Stroke Patients Only)       Balance Overall balance assessment: Needs assistance;History of Falls Sitting-balance support: Bilateral upper extremity supported;Feet supported Sitting balance-Leahy Scale: Good     Standing balance support: During functional activity;Single extremity supported Standing balance-Leahy Scale: Poor Standing balance comment: ModA to maintani balance with U UE support                            Cognition Arousal/Alertness: Awake/alert Behavior During Therapy: WFL for tasks assessed/performed Overall Cognitive Status: Within Functional Limits for tasks assessed                                        Exercises General Exercises - Lower Extremity Ankle Circles/Pumps: AROM;Both;10 reps;Seated Long Arc Quad: AROM;Both;10 reps;Seated Hip Flexion/Marching: AROM;Both;10 reps;Seated Shoulder Exercises Shoulder Flexion: AROM;Both;10 reps;Seated    General Comments        Pertinent Vitals/Pain Pain Assessment: No/denies pain    Home Living Family/patient expects to be discharged to:: Private residence Living Arrangements: Alone                  Prior Function            PT Goals (current goals can now be found in the care plan section) Acute Rehab PT Goals Patient Stated Goal: get stronger/go to rehab PT Goal Formulation: With  patient Time For Goal Achievement: 11/04/20 Potential to Achieve Goals: Fair Progress towards PT goals: Progressing toward goals    Frequency    Min 2X/week      PT Plan Current plan remains appropriate    Co-evaluation              AM-PAC PT "6 Clicks" Mobility   Outcome Measure  Help needed turning from your back to your side while in a flat bed without using bedrails?: A Little Help needed moving from lying on your back to sitting on the side of a flat bed without using bedrails?: A Little Help needed moving to and from a bed to a chair (including  a wheelchair)?: A Little Help needed standing up from a chair using your arms (e.g., wheelchair or bedside chair)?: A Little Help needed to walk in hospital room?: A Little Help needed climbing 3-5 steps with a railing? : A Lot 6 Click Score: 17    End of Session Equipment Utilized During Treatment: Gait belt Activity Tolerance: Patient tolerated treatment well Patient left: in chair;with call bell/phone within reach;with chair alarm set Nurse Communication: Mobility status PT Visit Diagnosis: Unsteadiness on feet (R26.81);Repeated falls (R29.6);Muscle weakness (generalized) (M62.81);Difficulty in walking, not elsewhere classified (R26.2);Other abnormalities of gait and mobility (R26.89)     Time: 4970-2637 PT Time Calculation (min) (ACUTE ONLY): 16 min  Charges:  $Gait Training: 8-22 mins                     Ralene Bathe Kistler PT 10/23/2020  Acute Rehabilitation Services Pager [phone] Office [phone]

## 2020-10-23 NOTE — Plan of Care (Signed)

## 2020-10-23 NOTE — Care Management Important Message (Signed)
Important Message  Patient Details  Name: Andrea [name] MRN: [id number] Date of Birth: 1941/04/12   Medicare Important Message Given:  Yes  Due to staffing called the patients room to advise of the IM no answer.  IM mailed to the patient home address.     Shawna Kiener 10/23/2020, 11:01 AM

## 2020-10-24 DIAGNOSIS — M6258 Muscle wasting and atrophy, not elsewhere classified, other site: Secondary | ICD-10-CM | POA: Diagnosis not present

## 2020-10-24 DIAGNOSIS — Z23 Encounter for immunization: Secondary | ICD-10-CM | POA: Diagnosis not present

## 2020-10-24 DIAGNOSIS — R531 Weakness: Secondary | ICD-10-CM | POA: Diagnosis not present

## 2020-10-24 DIAGNOSIS — M2550 Pain in unspecified joint: Secondary | ICD-10-CM | POA: Diagnosis not present

## 2020-10-24 DIAGNOSIS — J9610 Chronic respiratory failure, unspecified whether with hypoxia or hypercapnia: Secondary | ICD-10-CM | POA: Diagnosis not present

## 2020-10-24 DIAGNOSIS — M6281 Muscle weakness (generalized): Secondary | ICD-10-CM | POA: Diagnosis not present

## 2020-10-24 DIAGNOSIS — R5381 Other malaise: Secondary | ICD-10-CM | POA: Diagnosis not present

## 2020-10-24 DIAGNOSIS — I10 Essential (primary) hypertension: Secondary | ICD-10-CM | POA: Diagnosis not present

## 2020-10-24 DIAGNOSIS — E1169 Type 2 diabetes mellitus with other specified complication: Secondary | ICD-10-CM | POA: Diagnosis not present

## 2020-10-24 DIAGNOSIS — H6123 Impacted cerumen, bilateral: Secondary | ICD-10-CM | POA: Diagnosis not present

## 2020-10-24 DIAGNOSIS — J441 Chronic obstructive pulmonary disease with (acute) exacerbation: Secondary | ICD-10-CM | POA: Diagnosis not present

## 2020-10-24 DIAGNOSIS — F330 Major depressive disorder, recurrent, mild: Secondary | ICD-10-CM | POA: Diagnosis not present

## 2020-10-24 DIAGNOSIS — I4891 Unspecified atrial fibrillation: Secondary | ICD-10-CM | POA: Diagnosis not present

## 2020-10-24 DIAGNOSIS — H6590 Unspecified nonsuppurative otitis media, unspecified ear: Secondary | ICD-10-CM | POA: Diagnosis not present

## 2020-10-24 DIAGNOSIS — R2681 Unsteadiness on feet: Secondary | ICD-10-CM | POA: Diagnosis not present

## 2020-10-24 DIAGNOSIS — Z7401 Bed confinement status: Secondary | ICD-10-CM | POA: Diagnosis not present

## 2020-10-24 DIAGNOSIS — F411 Generalized anxiety disorder: Secondary | ICD-10-CM | POA: Diagnosis not present

## 2020-10-24 DIAGNOSIS — R410 Disorientation, unspecified: Secondary | ICD-10-CM | POA: Diagnosis not present

## 2020-10-24 DIAGNOSIS — R41841 Cognitive communication deficit: Secondary | ICD-10-CM | POA: Diagnosis not present

## 2020-10-24 DIAGNOSIS — F4320 Adjustment disorder, unspecified: Secondary | ICD-10-CM | POA: Diagnosis not present

## 2020-10-24 DIAGNOSIS — G4733 Obstructive sleep apnea (adult) (pediatric): Secondary | ICD-10-CM | POA: Diagnosis not present

## 2020-10-24 DIAGNOSIS — J449 Chronic obstructive pulmonary disease, unspecified: Secondary | ICD-10-CM | POA: Diagnosis not present

## 2020-10-24 DIAGNOSIS — R42 Dizziness and giddiness: Secondary | ICD-10-CM | POA: Diagnosis not present

## 2020-10-24 DIAGNOSIS — R2689 Other abnormalities of gait and mobility: Secondary | ICD-10-CM | POA: Diagnosis not present

## 2020-10-24 DIAGNOSIS — E782 Mixed hyperlipidemia: Secondary | ICD-10-CM | POA: Diagnosis not present

## 2020-10-24 LAB — CBC
HCT: 37.3 % (ref 36.0–46.0)
Hemoglobin: 11.3 g/dL — ABNORMAL LOW (ref 12.0–15.0)
MCH: 25 pg — ABNORMAL LOW (ref 26.0–34.0)
MCHC: 30.3 g/dL (ref 30.0–36.0)
MCV: 82.5 fL (ref 80.0–100.0)
Platelets: 369 10*3/uL (ref 150–400)
RBC: 4.52 MIL/uL (ref 3.87–5.11)
RDW: 17 % — ABNORMAL HIGH (ref 11.5–15.5)
WBC: 13.4 10*3/uL — ABNORMAL HIGH (ref 4.0–10.5)
nRBC: 0 % (ref 0.0–0.2)

## 2020-10-24 LAB — GLUCOSE, CAPILLARY
Glucose-Capillary: 118 mg/dL — ABNORMAL HIGH (ref 70–99)
Glucose-Capillary: 264 mg/dL — ABNORMAL HIGH (ref 70–99)

## 2020-10-24 LAB — BASIC METABOLIC PANEL
Anion gap: 12 (ref 5–15)
BUN: 30 mg/dL — ABNORMAL HIGH (ref 8–23)
CO2: 25 mmol/L (ref 22–32)
Calcium: 9.4 mg/dL (ref 8.9–10.3)
Chloride: 101 mmol/L (ref 98–111)
Creatinine, Ser: 0.96 mg/dL (ref 0.44–1.00)
GFR, Estimated: 59 mL/min — ABNORMAL LOW (ref >60)
Glucose, Bld: 127 mg/dL — ABNORMAL HIGH (ref 70–99)
Potassium: 3.7 mmol/L (ref 3.5–5.1)
Sodium: 138 mmol/L (ref 135–145)

## 2020-10-24 MED ORDER — IPRATROPIUM-ALBUTEROL 0.5-2.5 (3) MG/3ML IN SOLN
3.0000 mL | Freq: Two times a day (BID) | RESPIRATORY_TRACT | Status: DC
Start: 2020-10-24 — End: 2021-07-20

## 2020-10-24 MED ORDER — CLONAZEPAM 0.5 MG PO TABS
0.5000 mg | ORAL_TABLET | Freq: Two times a day (BID) | ORAL | 0 refills | Status: DC | PRN
Start: 2020-10-24 — End: 2021-07-20

## 2020-10-24 MED ORDER — INSULIN ASPART 100 UNIT/ML ~~LOC~~ SOLN
SUBCUTANEOUS | 11 refills | Status: DC
Start: 2020-10-24 — End: 2021-07-20

## 2020-10-24 MED ORDER — METOPROLOL TARTRATE 25 MG PO TABS
25.0000 mg | ORAL_TABLET | Freq: Two times a day (BID) | ORAL | Status: DC
Start: 2020-10-24 — End: 2021-07-21

## 2020-10-24 MED ORDER — PREDNISONE 20 MG PO TABS: ORAL_TABLET | ORAL | 0 refills | Status: DC

## 2020-10-24 MED ORDER — INSULIN GLARGINE 100 UNIT/ML ~~LOC~~ SOLN: 15.0000 [IU] | Freq: Every day | SUBCUTANEOUS | 11 refills | Status: DC

## 2020-10-24 MED ORDER — SENNA 8.6 MG PO TABS
2.0000 | ORAL_TABLET | Freq: Every day | ORAL | 0 refills | Status: DC
Start: 2020-10-24 — End: 2021-07-20

## 2020-10-24 MED ORDER — POLYETHYLENE GLYCOL 3350 17 G PO PACK
17.0000 g | PACK | Freq: Every day | ORAL | 0 refills | Status: DC
Start: 2020-10-24 — End: 2021-07-20

## 2020-10-24 MED ORDER — IPRATROPIUM-ALBUTEROL 0.5-2.5 (3) MG/3ML IN SOLN: 3.0000 mL | RESPIRATORY_TRACT | Status: AC | PRN

## 2020-10-24 NOTE — TOC Transition Note (Signed)
Transition of Care [facility]) - CM/SW Discharge Note   Patient Details  Name: SHANESSA HODAK MRN: [id number] Date of Birth: [birth year]  Transition [facility] Care [facility]) CM/SW Contact:  Kermit Balo, RN Phone Number: 10/24/2020, 10:23 AM   Clinical Narrative:    Pt is discharging to [facility] today. She is transporting via [facility]. Bedside RN updated and d/c packet at the desk.   Room: 1202P Number for report: [phone]   Final next level of care: Skilled Nursing Facility Barriers to Discharge: No Barriers Identified   Patient Goals and CMS Choice   CMS Medicare.gov Compare Post Acute Care list provided to:: Patient Choice offered to / list presented to : Patient  Discharge Placement PASRR number recieved: 10/23/20            Patient chooses bed at: [facility] Patient to be transferred to facility by: [facility] Name of family member notified: patient to call her son Patient and family notified of of transfer: 10/24/20  Discharge Plan and Services In-house Referral: Clinical Social Work Discharge Planning Services: [employer] Consult Post Acute Care Choice: Skilled Nursing Facility                               Social Determinants of Health (SDOH) Interventions     Readmission Risk Interventions No flowsheet data found.

## 2020-10-24 NOTE — Progress Notes (Signed)
Attempted to call report twice to [facility] but I have not been able to speak with a nurse.  I have spoken with the secretary and she transfers me to the nurses station where 1202 is located but no nurses pick up the phone.  Discharge instructions have been printed and placed in patients discharge folder.

## 2020-10-24 NOTE — Discharge Summary (Signed)
[facility]  Physician Discharge Summary   Patient ID: Andrea AreaYvonne H [name] MRN: [id number] DOB/AGE: 81/14/1942 81 y.o.  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 Please check CBGs 3 times a day before meals and administer sliding scale coverage 2. Please titrate dose of Lantus. As her steroid is tapered down her CBG should improve 3. Please check CBC and basic metabolic panel in 1 week 4. Please refer to pulmonology for her pulmonary arterial hypertension and sleep apnea. Can do this when she is released from SNF.    Home Health: Going to SNF Equipment/Devices: None  CODE STATUS: Full code  DISCHARGE CONDITION: fair  Diet recommendation: Modified carbohydrate  INITIAL HISTORY: Mrs. Andrea [name] is a 79 y.o. F with hx AF not on AC due to hx GIB, anemia, DM, HTN, obesity BMI 40, and PAH, OHS, OSA not on CPAP, PAH, and COPD with chronic respiratory failure on 3L who presents with worsening SOB for few weeks, now severe.  Over last 3 weeks, patient with progressive SOB on exertion, worsening until severe, associated with cough productive of yellow sputum and palpitations.  In the ER, CXR showed patchy bilateral infiltrates, Afib with rapid rate, and she was started on steroids, antibiotics and bronchodilators.      HOSPITAL COURSE:   COPD with acute exacerbation Chronic respiratory failure with hypoxia Concern for pneumonia though more likely to be atelectasis Patient was treated with nebulizer treatment antibiotics and steroids.   She has  improved and seems to be back to her baseline.  Continue to taper steroids. Patient mentions that she uses oxygen at home mainly at nighttime at 3 L/min.  Atrial fibrillation, chronic Atrial fibrillation with RVR Patient admitted with A. fib with rates in the 130s.  Started on diltiazem drip, rate controlled overnight, transitioned back to home diltiazem.   Was also placed on metoprolol. Seems to be stable at this time.  Obesity hypoventilation syndrome OSA not on CPAP Moderate pulmonary arterial hypertension Admitted on diuretics, appeared euvolemic.  Diuretics were discontinued.  Renal function is stable. Will recommend pulmonology referral in the next few weeks.  Chronic iron deficiency anemia due to chronic GI bleed No evidence of overt bleeding.  Hemoglobin has been stable.  Diabetes mellitus type 2, uncontrolled with hyperglycemia HbA1c 6.9.  Elevated glucose most likely due to steroids.    Reasonably well controlled on Lantus and SSI.  Should improve as steroid is tapered down.  Adjust Lantus dose accordingly.  Essential hypertension Reasonably well-controlled with occasional high readings.  Continue lisinopril and metoprolol.    Mood disorder Continue citalopram  Obesity Estimated body mass index is 40.67 kg/m as calculated from the following:   Height as of 08/22/20: 5\' 3"  (1.6 m).   Weight as of 08/22/20: 104.1 kg.  Overall stable.  Okay for discharge to SNF today.   PERTINENT LABS:  The results of significant diagnostics from this hospitalization (including imaging, microbiology, ancillary and laboratory) are listed below for reference.    Microbiology: Recent Results (from the past 240 hour(s))  Resp Panel by RT-PCR (Flu A&B, Covid) Nasopharyngeal Swab     Status: None   Collection Time: 10/19/20 11:31 PM  Specimen: Nasopharyngeal Swab; Nasopharyngeal(NP) swabs in vial transport medium  Result Value Ref Range Status   SARS Coronavirus 2 by RT PCR  NEGATIVE NEGATIVE Final    Comment: (NOTE) SARS-CoV-2 target nucleic acids are NOT DETECTED.  The SARS-CoV-2 RNA is generally detectable in upper respiratory specimens during the acute phase of infection. The lowest concentration of SARS-CoV-2 viral copies this assay can detect is 138 copies/mL. A negative result does not preclude SARS-Cov-2 infection and should not be used as the sole basis for treatment or other patient management decisions. A negative result may occur with  improper specimen collection/handling, submission of specimen other than nasopharyngeal swab, presence of viral mutation(s) within the areas targeted by this assay, and inadequate number of viral copies(<138 copies/mL). A negative result must be combined with clinical observations, patient history, and epidemiological information. The expected result is Negative.  Fact Sheet for Patients:  BloggerCourse.com  Fact Sheet for Healthcare Providers:  SeriousBroker.it  This test is no t yet approved or cleared by the [place] FDA and  has been authorized for detection and/or diagnosis of SARS-CoV-2 by FDA under an Emergency Use Authorization (EUA). This EUA will remain  in effect (meaning this test can be used) for the duration of the COVID-19 declaration under Section 564(b)(1) of the Act, 21 U.S.C.section 360bbb-3(b)(1), unless the authorization is terminated  or revoked sooner.       Influenza A by PCR NEGATIVE NEGATIVE Final   Influenza B by PCR NEGATIVE NEGATIVE Final    Comment: (NOTE) The Xpert Xpress SARS-CoV-2/FLU/RSV plus assay is intended as an aid in the diagnosis of influenza from Nasopharyngeal swab specimens and should not be used as a sole basis for treatment. Nasal washings and aspirates are unacceptable for Xpert Xpress SARS-CoV-2/FLU/RSV testing.  Fact Sheet for Patients: BloggerCourse.com  Fact Sheet for  Healthcare Providers: SeriousBroker.it  This test is not yet approved or cleared by the [place] FDA and has been authorized for detection and/or diagnosis of SARS-CoV-2 by FDA under an Emergency Use Authorization (EUA). This EUA will remain in effect (meaning this test can be used) for the duration of the COVID-19 declaration under Section 564(b)(1) of the Act, 21 U.S.C. section 360bbb-3(b)(1), unless the authorization is terminated or revoked.  Performed at [facility], [address] [place], [place] [address]   Blood culture (routine x 2)     Status: None (Preliminary result)   Collection Time: 10/20/20 12:59 AM   Specimen: BLOOD RIGHT WRIST  Result Value Ref Range Status   Specimen Description BLOOD RIGHT WRIST  Final   Special Requests   Final    BOTTLES DRAWN AEROBIC AND ANAEROBIC Blood Culture adequate volume   Culture   Final    NO GROWTH 4 DAYS Performed at [facility], [address] [place], [place] [address]    Report Status PENDING  Incomplete  Blood culture (routine x 2)     Status: None (Preliminary result)   Collection Time: 10/20/20 12:59 AM   Specimen: BLOOD LEFT WRIST  Result Value Ref Range Status   Specimen Description BLOOD LEFT WRIST  Final   Special Requests   Final    BOTTLES DRAWN AEROBIC AND ANAEROBIC Blood Culture results may not be optimal due to an inadequate volume of blood received in culture bottles   Culture   Final    NO GROWTH 4 DAYS Performed at [facility], [address], [place], [place] [address]  Report Status PENDING  Incomplete  Respiratory Panel by PCR     Status: None   Collection Time: 10/20/20  9:43 AM   Specimen: Nasopharyngeal Swab; Respiratory  Result Value Ref Range Status   Adenovirus NOT DETECTED NOT DETECTED Final   Coronavirus 229E NOT DETECTED NOT DETECTED Final    Comment: (NOTE) The Coronavirus on the Respiratory Panel, DOES NOT test for the novel    Coronavirus (2019 nCoV)    Coronavirus HKU1 NOT DETECTED NOT DETECTED Final   Coronavirus NL63 NOT DETECTED NOT DETECTED Final   Coronavirus OC43 NOT DETECTED NOT DETECTED Final   Metapneumovirus NOT DETECTED NOT DETECTED Final   Rhinovirus / Enterovirus NOT DETECTED NOT DETECTED Final   Influenza A NOT DETECTED NOT DETECTED Final   Influenza B NOT DETECTED NOT DETECTED Final   Parainfluenza Virus 1 NOT DETECTED NOT DETECTED Final   Parainfluenza Virus 2 NOT DETECTED NOT DETECTED Final   Parainfluenza Virus 3 NOT DETECTED NOT DETECTED Final   Parainfluenza Virus 4 NOT DETECTED NOT DETECTED Final   Respiratory Syncytial Virus NOT DETECTED NOT DETECTED Final   Bordetella pertussis NOT DETECTED NOT DETECTED Final   Chlamydophila pneumoniae NOT DETECTED NOT DETECTED Final   Mycoplasma pneumoniae NOT DETECTED NOT DETECTED Final    Comment: Performed at [facility], [address] [place], [place] [address]  SARS Coronavirus 2 by RT PCR (hospital order, performed in [facility] hospital lab) Nasopharyngeal Nasopharyngeal Swab     Status: None   Collection Time: 10/23/20  4:21 PM   Specimen: Nasopharyngeal Swab  Result Value Ref Range Status   SARS Coronavirus 2 NEGATIVE NEGATIVE Final    Comment: (NOTE) SARS-CoV-2 target nucleic acids are NOT DETECTED.  The SARS-CoV-2 RNA is generally detectable in upper and lower respiratory specimens during the acute phase of infection. The lowest concentration of SARS-CoV-2 viral copies this assay can detect is 250 copies / mL. A negative result does not preclude SARS-CoV-2 infection and should not be used as the sole basis for treatment or other patient management decisions.  A negative result may occur with improper specimen collection / handling, submission of specimen other than nasopharyngeal swab, presence of viral mutation(s) within the areas targeted by this assay, and inadequate number of viral copies (<250 copies / mL). A negative  result must be combined with clinical observations, patient history, and epidemiological information.  Fact Sheet for Patients:   BoilerBrush.com.cy  Fact Sheet for Healthcare Providers: https://pope.com/  This test is not yet approved or  cleared by the [place] FDA and has been authorized for detection and/or diagnosis of SARS-CoV-2 by FDA under an Emergency Use Authorization (EUA).  This EUA will remain in effect (meaning this test can be used) for the duration of the COVID-19 declaration under Section 564(b)(1) of the Act, 21 U.S.C. section 360bbb-3(b)(1), unless the authorization is terminated or revoked sooner.  Performed at [facility], [address] [place], [place] [address]      Labs:     Basic Metabolic Panel: Recent Labs  Lab 10/20/20 0106 10/20/20 0108 10/20/20 0108 10/20/20 0405 10/20/20 0601 10/21/20 0228 10/22/20 0138 10/24/20 0448  NA  --  139   < > 140 137 136 136 138  K  --  3.6   < > 3.2* 3.6 3.9 4.6 3.7  CL  --  101  --   --  98 98 99 101  CO2  --  22  --   --  22 23 23 25   GLUCOSE  --  184*  --   --  239* 231* 245* 127*  BUN  --  8  --   --  8 27* 38* 30*  CREATININE  --  0.83  --   --  0.83 1.12* 1.12* 0.96  CALCIUM  --  9.2  --   --  8.8* 10.1 10.2 9.4  MG 1.8  --   --   --  2.3  --   --   --    < > = values in this interval not displayed.   Liver Function Tests: Recent Labs  Lab 10/20/20 0601  AST 16  ALT 12  ALKPHOS 134*  BILITOT 0.8  PROT 7.4  ALBUMIN 3.5   CBC: Recent Labs  Lab 10/20/20 0106 10/20/20 0106 10/20/20 0405 10/20/20 0601 10/21/20 0228 10/22/20 0138 10/24/20 0448  WBC 12.1*  --   --  11.7* 15.4* 17.3* 13.4*  NEUTROABS 10.0*  --   --  10.8*  --   --   --   HGB 11.5*   < > 11.6* 11.4* 11.3* 11.5* 11.3*  HCT 38.0   < > 34.0* 37.6 36.7 38.2 37.3  MCV 83.5  --   --  83.4 81.4 83.2 82.5  PLT 307  --   --  298 348 391 369   < > = values in this  interval not displayed.   BNP: BNP (last 3 results) Recent Labs    10/20/20 0107  BNP 270.6*    CBG: Recent Labs  Lab 10/22/20 2055 10/23/20 0730 10/23/20 1134 10/23/20 1639 10/23/20 2005  GLUCAP 265* 142* 227* 308* 300*     IMAGING STUDIES DG Chest Portable 1 View  Result Date: 10/19/2020 CLINICAL DATA:  Cough and shortness of breath. Weakness. Worse tonight. EXAM: PORTABLE CHEST 1 VIEW COMPARISON:  Chest x-ray 09/14/2018, chest x-ray 08/12/2016. FINDINGS: The heart size and mediastinal contours are unchanged with enlarged cardiac silhouette. Aortic arch calcifications. Similar-appearing (as far back as chest x-ray 08/12/2016) right paratracheal stripe thickening measuring 2.6 x 5.5 cm. Linear atelectasis within the left mid lung zone. Bibasilar hazy airspace opacity. No pulmonary edema. No pleural effusion. No pneumothorax. No acute osseous abnormality. Suggestion of calcific tendinosis of the left shoulder. IMPRESSION: Bibasilar hazy airspace opacity that could represent a combination of atelectasis versus infection/inflammation. Electronically Signed   By: 08/14/2016 M.D.   On: 10/19/2020 23:49    DISCHARGE EXAMINATION: Vitals:   10/23/20 1346 10/23/20 1946 10/23/20 2008 10/24/20 0829  BP: (!) 145/95  (!) 139/97   Pulse: 81 86 100 99  Resp: 19 17 19 16   Temp: 98.1 F (36.7 C)  97.9 F (36.6 C)   TempSrc:      SpO2: 98%  91% 99%   General appearance: Awake alert.  In no distress Resp: Coarse breath sounds.  Occasional wheezing.  Normal effort.  No crackles. Cardio: S1-S2 is normal regular.  No S3-S4.  No rubs murmurs or bruit GI: Abdomen is soft.  Nontender nondistended.  Bowel sounds are present normal.  No masses organomegaly Extremities: No edema.  Full range of motion of lower extremities. Neurologic: Alert and oriented x3.  No focal neurological deficits.    DISPOSITION: SNF  Discharge Instructions    Call MD for:  difficulty breathing, headache or  visual disturbances   Complete by: As directed    Call MD for:  extreme fatigue   Complete by: As directed  Call MD for:  persistant dizziness or light-headedness   Complete by: As directed    Call MD for:  persistant nausea and vomiting   Complete by: As directed    Call MD for:  severe uncontrolled pain   Complete by: As directed    Call MD for:  temperature >100.4   Complete by: As directed    Diet Carb Modified   Complete by: As directed    Discharge instructions   Complete by: As directed    Please review instructions on the discharge summary.  You were cared for by a hospitalist during your hospital stay. If you have any questions about your discharge medications or the care you received while you were in the hospital after you are discharged, you can call the unit and asked to speak with the hospitalist on call if the hospitalist that took care of you is not available. Once you are discharged, your primary care physician will handle any further medical issues. Please note that NO REFILLS for any discharge medications will be authorized once you are discharged, as it is imperative that you return to your primary care physician (or establish a relationship with a primary care physician if you do not have one) for your aftercare needs so that they can reassess your need for medications and monitor your lab values. If you do not have a primary care physician, you can call [phone] for a physician referral.   Increase activity slowly   Complete by: As directed         Allergies as of 10/24/2020      Reactions   Shellfish Allergy Hives, Itching, Swelling      Medication List    STOP taking these medications   Aleve 220 MG tablet Generic drug: naproxen sodium     TAKE these medications   acetaminophen 500 MG tablet Commonly known as: TYLENOL Take 1,000 mg by mouth every 6 (six) hours as needed for mild pain.   Breo Ellipta 100-25 MCG/INH Aepb Generic drug: fluticasone  furoate-vilanterol Inhale 1 puff into the lungs daily.   citalopram 20 MG tablet Commonly known as: CELEXA Take 20 mg by mouth daily.   clonazePAM 0.5 MG tablet Commonly known as: KLONOPIN Take 1 tablet (0.5 mg total) by mouth 2 (two) times daily as needed for anxiety.   diltiazem 360 MG 24 hr capsule Commonly known as: Cardizem CD Take 1 capsule (360 mg total) by mouth daily.   insulin aspart 100 UNIT/ML injection Commonly known as: novoLOG Subcutaneously three times daily before meals based on following sliding scale:  CBG 70 - 120: 0 units CBG 121 - 150: 2 units CBG 151 - 200: 3 units CBG 201 - 250: 5 units CBG 251 - 300: 8 units CBG 301 - 350: 11 units CBG 351 - 400: 15 units CBG > 400: call MD and obtain STAT lab verification   insulin glargine 100 UNIT/ML injection Commonly known as: LANTUS Inject 0.15 mLs (15 Units total) into the skin at bedtime.   ipratropium-albuterol 0.5-2.5 (3) MG/3ML Soln Commonly known as: DUONEB Take 3 mLs by nebulization every 4 (four) hours as needed.   ipratropium-albuterol 0.5-2.5 (3) MG/3ML Soln Commonly known as: DUONEB Take 3 mLs by nebulization 2 (two) times daily.   lisinopril 20 MG tablet Commonly known as: ZESTRIL Take 1 tablet (20 mg total) by mouth daily.   metoprolol tartrate 25 MG tablet Commonly known as: LOPRESSOR Take 1 tablet (25 mg total) by mouth 2 (  two) times daily.   OXYGEN Inhale 3 L into the lungs at bedtime.   polyethylene glycol 17 g packet Commonly known as: MIRALAX / GLYCOLAX Take 17 g by mouth daily.   predniSONE 20 MG tablet Commonly known as: DELTASONE Take 3 tablets once daily for 3 days followed by 2 tablets once daily for 3 days followed by 1 tablet once daily for 3 days and then stop   senna 8.6 MG Tabs tablet Commonly known as: SENOKOT Take 2 tablets (17.2 mg total) by mouth at bedtime.   tetrahydrozoline-zinc 0.05-0.25 % ophthalmic solution Commonly known as: VISINE-AC Place 2 drops  into both eyes 3 (three) times daily as needed (Red Itchy Eyes).         Follow-up Information    Vyas, Dhruv B, MD. Schedule an appointment as soon as possible for a visit in 1 week(s).   Specialty: Internal Medicine Contact information: [address] [place] [address] [phone]               TOTAL DISCHARGE TIME: 35 minutes  Calena Salem Rito Ehrlich  [facility] Pager on [email or web address]  10/24/2020, 9:16 AM

## 2020-10-24 NOTE — Progress Notes (Signed)
Patient discharged at this time and transferred to outside facility via transportation services. All patient belonging collected and sent with patient. Patient denies any questions or concerns at this time. Vitals stable. Report called to receiving facility. Family aware of discharge and notified by patient.

## 2020-10-24 NOTE — Discharge Instructions (Signed)
Chronic Obstructive Pulmonary Disease Chronic obstructive pulmonary disease (COPD) is a long-term (chronic) lung problem. When you have COPD, it is hard for air to get in and out of your lungs. Usually the condition gets worse over time, and your lungs will never return to normal. There are things you can do to keep yourself as healthy as possible.  Your doctor may treat your condition with: ? Medicines. ? Oxygen. ? Lung surgery.  Your doctor may also recommend: ? Rehabilitation. This includes steps to make your body work better. It may involve a team of specialists. ? Quitting smoking, if you smoke. ? Exercise and changes to your diet. ? Comfort measures (palliative care). Follow these instructions at home: Medicines  Take over-the-counter and prescription medicines only as told by your doctor.  Talk to your doctor before taking any cough or allergy medicines. You may need to avoid medicines that cause your lungs to be dry. Lifestyle  If you smoke, stop. Smoking makes the problem worse. If you need help quitting, ask your doctor.  Avoid being around things that make your breathing worse. This may include smoke, chemicals, and fumes.  Stay active, but remember to rest as well.  Learn and use tips on how to relax.  Make sure you get enough sleep. Most adults need at least 7 hours of sleep every night.  Eat healthy foods. Eat smaller meals more often. Rest before meals. Controlled breathing Learn and use tips on how to control your breathing as told by your doctor. Try:  Breathing in (inhaling) through your nose for 1 second. Then, pucker your lips and breath out (exhale) through your lips for 2 seconds.  Putting one hand on your belly (abdomen). Breathe in slowly through your nose for 1 second. Your hand on your belly should move out. Pucker your lips and breathe out slowly through your lips. Your hand on your belly should move in as you breathe out.  Controlled coughing Learn  and use controlled coughing to clear mucus from your lungs. Follow these steps: 1. Lean your head a little forward. 2. Breathe in deeply. 3. Try to hold your breath for 3 seconds. 4. Keep your mouth slightly open while coughing 2 times. 5. Spit any mucus out into a tissue. 6. Rest and do the steps again 1 or 2 times as needed. General instructions  Make sure you get all the shots (vaccines) that your doctor recommends. Ask your doctor about a flu shot and a pneumonia shot.  Use oxygen therapy and pulmonary rehabilitation if told by your doctor. If you need home oxygen therapy, ask your doctor if you should buy a tool to measure your oxygen level (oximeter).  Make a COPD action plan with your doctor. This helps you to know what to do if you feel worse than usual.  Manage any other conditions you have as told by your doctor.  Avoid going outside when it is very hot, cold, or humid.  Avoid people who have a sickness you can catch (contagious).  Keep all follow-up visits as told by your doctor. This is important. Contact a doctor if:  You cough up more mucus than usual.  There is a change in the color or thickness of the mucus.  It is harder to breathe than usual.  Your breathing is faster than usual.  You have trouble sleeping.  You need to use your medicines more often than usual.  You have trouble doing your normal activities such as getting dressed   or walking around the house. Get help right away if:  You have shortness of breath while resting.  You have shortness of breath that stops you from: ? Being able to talk. ? Doing normal activities.  Your chest hurts for longer than 5 minutes.  Your skin color is more blue than usual.  Your pulse oximeter shows that you have low oxygen for longer than 5 minutes.  You have a fever.  You feel too tired to breathe normally. Summary  Chronic obstructive pulmonary disease (COPD) is a long-term lung problem.  The way your  lungs work will never return to normal. Usually the condition gets worse over time. There are things you can do to keep yourself as healthy as possible.  Take over-the-counter and prescription medicines only as told by your doctor.  If you smoke, stop. Smoking makes the problem worse. This information is not intended to replace advice given to you by your health care provider. Make sure you discuss any questions you have with your health care provider. Document Revised: 10/21/2017 Document Reviewed: 12/13/2016 Elsevier Patient Education  2020 [employer]  

## 2020-10-25 LAB — CULTURE, BLOOD (ROUTINE X 2)
Culture: NO GROWTH
Culture: NO GROWTH
Special Requests: ADEQUATE

## 2020-10-27 DIAGNOSIS — E782 Mixed hyperlipidemia: Secondary | ICD-10-CM | POA: Diagnosis not present

## 2020-10-27 DIAGNOSIS — J449 Chronic obstructive pulmonary disease, unspecified: Secondary | ICD-10-CM | POA: Diagnosis not present

## 2020-10-27 DIAGNOSIS — R2681 Unsteadiness on feet: Secondary | ICD-10-CM | POA: Diagnosis not present

## 2020-10-27 DIAGNOSIS — G4733 Obstructive sleep apnea (adult) (pediatric): Secondary | ICD-10-CM | POA: Diagnosis not present

## 2020-10-27 DIAGNOSIS — I10 Essential (primary) hypertension: Secondary | ICD-10-CM | POA: Diagnosis not present

## 2020-10-27 DIAGNOSIS — I4891 Unspecified atrial fibrillation: Secondary | ICD-10-CM | POA: Diagnosis not present

## 2020-10-27 DIAGNOSIS — F411 Generalized anxiety disorder: Secondary | ICD-10-CM | POA: Diagnosis not present

## 2020-10-27 DIAGNOSIS — M6281 Muscle weakness (generalized): Secondary | ICD-10-CM | POA: Diagnosis not present

## 2020-10-28 DIAGNOSIS — J441 Chronic obstructive pulmonary disease with (acute) exacerbation: Secondary | ICD-10-CM | POA: Diagnosis not present

## 2020-10-28 DIAGNOSIS — J9610 Chronic respiratory failure, unspecified whether with hypoxia or hypercapnia: Secondary | ICD-10-CM | POA: Diagnosis not present

## 2020-10-28 DIAGNOSIS — G4733 Obstructive sleep apnea (adult) (pediatric): Secondary | ICD-10-CM | POA: Diagnosis not present

## 2020-10-28 DIAGNOSIS — I4891 Unspecified atrial fibrillation: Secondary | ICD-10-CM | POA: Diagnosis not present

## 2020-10-29 DIAGNOSIS — E782 Mixed hyperlipidemia: Secondary | ICD-10-CM | POA: Diagnosis not present

## 2020-10-29 DIAGNOSIS — J449 Chronic obstructive pulmonary disease, unspecified: Secondary | ICD-10-CM | POA: Diagnosis not present

## 2020-10-29 DIAGNOSIS — I10 Essential (primary) hypertension: Secondary | ICD-10-CM | POA: Diagnosis not present

## 2020-10-29 DIAGNOSIS — M6281 Muscle weakness (generalized): Secondary | ICD-10-CM | POA: Diagnosis not present

## 2020-10-29 DIAGNOSIS — G4733 Obstructive sleep apnea (adult) (pediatric): Secondary | ICD-10-CM | POA: Diagnosis not present

## 2020-10-29 DIAGNOSIS — R2681 Unsteadiness on feet: Secondary | ICD-10-CM | POA: Diagnosis not present

## 2020-10-29 DIAGNOSIS — F411 Generalized anxiety disorder: Secondary | ICD-10-CM | POA: Diagnosis not present

## 2020-10-29 DIAGNOSIS — I4891 Unspecified atrial fibrillation: Secondary | ICD-10-CM | POA: Diagnosis not present

## 2020-10-31 DIAGNOSIS — F411 Generalized anxiety disorder: Secondary | ICD-10-CM | POA: Diagnosis not present

## 2020-10-31 DIAGNOSIS — R2681 Unsteadiness on feet: Secondary | ICD-10-CM | POA: Diagnosis not present

## 2020-10-31 DIAGNOSIS — G4733 Obstructive sleep apnea (adult) (pediatric): Secondary | ICD-10-CM | POA: Diagnosis not present

## 2020-10-31 DIAGNOSIS — E782 Mixed hyperlipidemia: Secondary | ICD-10-CM | POA: Diagnosis not present

## 2020-10-31 DIAGNOSIS — J449 Chronic obstructive pulmonary disease, unspecified: Secondary | ICD-10-CM | POA: Diagnosis not present

## 2020-10-31 DIAGNOSIS — I10 Essential (primary) hypertension: Secondary | ICD-10-CM | POA: Diagnosis not present

## 2020-10-31 DIAGNOSIS — M6281 Muscle weakness (generalized): Secondary | ICD-10-CM | POA: Diagnosis not present

## 2020-10-31 DIAGNOSIS — I4891 Unspecified atrial fibrillation: Secondary | ICD-10-CM | POA: Diagnosis not present

## 2020-11-03 DIAGNOSIS — R2681 Unsteadiness on feet: Secondary | ICD-10-CM | POA: Diagnosis not present

## 2020-11-03 DIAGNOSIS — I10 Essential (primary) hypertension: Secondary | ICD-10-CM | POA: Diagnosis not present

## 2020-11-03 DIAGNOSIS — M6281 Muscle weakness (generalized): Secondary | ICD-10-CM | POA: Diagnosis not present

## 2020-11-03 DIAGNOSIS — I4891 Unspecified atrial fibrillation: Secondary | ICD-10-CM | POA: Diagnosis not present

## 2020-11-03 DIAGNOSIS — E782 Mixed hyperlipidemia: Secondary | ICD-10-CM | POA: Diagnosis not present

## 2020-11-03 DIAGNOSIS — J449 Chronic obstructive pulmonary disease, unspecified: Secondary | ICD-10-CM | POA: Diagnosis not present

## 2020-11-03 DIAGNOSIS — G4733 Obstructive sleep apnea (adult) (pediatric): Secondary | ICD-10-CM | POA: Diagnosis not present

## 2020-11-03 DIAGNOSIS — F411 Generalized anxiety disorder: Secondary | ICD-10-CM | POA: Diagnosis not present

## 2020-11-05 DIAGNOSIS — I10 Essential (primary) hypertension: Secondary | ICD-10-CM | POA: Diagnosis not present

## 2020-11-05 DIAGNOSIS — M6281 Muscle weakness (generalized): Secondary | ICD-10-CM | POA: Diagnosis not present

## 2020-11-05 DIAGNOSIS — F411 Generalized anxiety disorder: Secondary | ICD-10-CM | POA: Diagnosis not present

## 2020-11-05 DIAGNOSIS — E782 Mixed hyperlipidemia: Secondary | ICD-10-CM | POA: Diagnosis not present

## 2020-11-05 DIAGNOSIS — J9610 Chronic respiratory failure, unspecified whether with hypoxia or hypercapnia: Secondary | ICD-10-CM | POA: Diagnosis not present

## 2020-11-05 DIAGNOSIS — J441 Chronic obstructive pulmonary disease with (acute) exacerbation: Secondary | ICD-10-CM | POA: Diagnosis not present

## 2020-11-05 DIAGNOSIS — R42 Dizziness and giddiness: Secondary | ICD-10-CM | POA: Diagnosis not present

## 2020-11-05 DIAGNOSIS — G4733 Obstructive sleep apnea (adult) (pediatric): Secondary | ICD-10-CM | POA: Diagnosis not present

## 2020-11-05 DIAGNOSIS — J449 Chronic obstructive pulmonary disease, unspecified: Secondary | ICD-10-CM | POA: Diagnosis not present

## 2020-11-05 DIAGNOSIS — I4891 Unspecified atrial fibrillation: Secondary | ICD-10-CM | POA: Diagnosis not present

## 2020-11-05 DIAGNOSIS — R2681 Unsteadiness on feet: Secondary | ICD-10-CM | POA: Diagnosis not present

## 2020-11-07 DIAGNOSIS — G4733 Obstructive sleep apnea (adult) (pediatric): Secondary | ICD-10-CM | POA: Diagnosis not present

## 2020-11-07 DIAGNOSIS — M6281 Muscle weakness (generalized): Secondary | ICD-10-CM | POA: Diagnosis not present

## 2020-11-07 DIAGNOSIS — I4891 Unspecified atrial fibrillation: Secondary | ICD-10-CM | POA: Diagnosis not present

## 2020-11-07 DIAGNOSIS — J449 Chronic obstructive pulmonary disease, unspecified: Secondary | ICD-10-CM | POA: Diagnosis not present

## 2020-11-07 DIAGNOSIS — E782 Mixed hyperlipidemia: Secondary | ICD-10-CM | POA: Diagnosis not present

## 2020-11-07 DIAGNOSIS — F411 Generalized anxiety disorder: Secondary | ICD-10-CM | POA: Diagnosis not present

## 2020-11-07 DIAGNOSIS — I10 Essential (primary) hypertension: Secondary | ICD-10-CM | POA: Diagnosis not present

## 2020-11-07 DIAGNOSIS — R2681 Unsteadiness on feet: Secondary | ICD-10-CM | POA: Diagnosis not present

## 2020-11-10 DIAGNOSIS — H6590 Unspecified nonsuppurative otitis media, unspecified ear: Secondary | ICD-10-CM | POA: Diagnosis not present

## 2020-11-10 DIAGNOSIS — I10 Essential (primary) hypertension: Secondary | ICD-10-CM | POA: Diagnosis not present

## 2020-11-10 DIAGNOSIS — M6281 Muscle weakness (generalized): Secondary | ICD-10-CM | POA: Diagnosis not present

## 2020-11-10 DIAGNOSIS — G4733 Obstructive sleep apnea (adult) (pediatric): Secondary | ICD-10-CM | POA: Diagnosis not present

## 2020-11-10 DIAGNOSIS — F330 Major depressive disorder, recurrent, mild: Secondary | ICD-10-CM | POA: Diagnosis not present

## 2020-11-10 DIAGNOSIS — R42 Dizziness and giddiness: Secondary | ICD-10-CM | POA: Diagnosis not present

## 2020-11-10 DIAGNOSIS — E782 Mixed hyperlipidemia: Secondary | ICD-10-CM | POA: Diagnosis not present

## 2020-11-10 DIAGNOSIS — H6123 Impacted cerumen, bilateral: Secondary | ICD-10-CM | POA: Diagnosis not present

## 2020-11-10 DIAGNOSIS — F411 Generalized anxiety disorder: Secondary | ICD-10-CM | POA: Diagnosis not present

## 2020-11-10 DIAGNOSIS — I4891 Unspecified atrial fibrillation: Secondary | ICD-10-CM | POA: Diagnosis not present

## 2020-11-10 DIAGNOSIS — E1169 Type 2 diabetes mellitus with other specified complication: Secondary | ICD-10-CM | POA: Diagnosis not present

## 2020-11-10 DIAGNOSIS — R2681 Unsteadiness on feet: Secondary | ICD-10-CM | POA: Diagnosis not present

## 2020-11-10 DIAGNOSIS — F4320 Adjustment disorder, unspecified: Secondary | ICD-10-CM | POA: Diagnosis not present

## 2020-11-10 DIAGNOSIS — J449 Chronic obstructive pulmonary disease, unspecified: Secondary | ICD-10-CM | POA: Diagnosis not present

## 2020-11-11 DIAGNOSIS — J9610 Chronic respiratory failure, unspecified whether with hypoxia or hypercapnia: Secondary | ICD-10-CM | POA: Diagnosis not present

## 2020-11-11 DIAGNOSIS — I4891 Unspecified atrial fibrillation: Secondary | ICD-10-CM | POA: Diagnosis not present

## 2020-11-11 DIAGNOSIS — G4733 Obstructive sleep apnea (adult) (pediatric): Secondary | ICD-10-CM | POA: Diagnosis not present

## 2020-11-11 DIAGNOSIS — J441 Chronic obstructive pulmonary disease with (acute) exacerbation: Secondary | ICD-10-CM | POA: Diagnosis not present

## 2020-11-12 DIAGNOSIS — I4891 Unspecified atrial fibrillation: Secondary | ICD-10-CM | POA: Diagnosis not present

## 2020-11-12 DIAGNOSIS — F411 Generalized anxiety disorder: Secondary | ICD-10-CM | POA: Diagnosis not present

## 2020-11-12 DIAGNOSIS — E782 Mixed hyperlipidemia: Secondary | ICD-10-CM | POA: Diagnosis not present

## 2020-11-12 DIAGNOSIS — G4733 Obstructive sleep apnea (adult) (pediatric): Secondary | ICD-10-CM | POA: Diagnosis not present

## 2020-11-12 DIAGNOSIS — I10 Essential (primary) hypertension: Secondary | ICD-10-CM | POA: Diagnosis not present

## 2020-11-12 DIAGNOSIS — M6281 Muscle weakness (generalized): Secondary | ICD-10-CM | POA: Diagnosis not present

## 2020-11-12 DIAGNOSIS — J449 Chronic obstructive pulmonary disease, unspecified: Secondary | ICD-10-CM | POA: Diagnosis not present

## 2020-11-12 DIAGNOSIS — R2681 Unsteadiness on feet: Secondary | ICD-10-CM | POA: Diagnosis not present

## 2020-11-14 DIAGNOSIS — M6258 Muscle wasting and atrophy, not elsewhere classified, other site: Secondary | ICD-10-CM | POA: Diagnosis not present

## 2020-11-14 DIAGNOSIS — F39 Unspecified mood [affective] disorder: Secondary | ICD-10-CM | POA: Diagnosis not present

## 2020-11-14 DIAGNOSIS — K5791 Diverticulosis of intestine, part unspecified, without perforation or abscess with bleeding: Secondary | ICD-10-CM | POA: Diagnosis not present

## 2020-11-14 DIAGNOSIS — J441 Chronic obstructive pulmonary disease with (acute) exacerbation: Secondary | ICD-10-CM | POA: Diagnosis not present

## 2020-11-14 DIAGNOSIS — M1990 Unspecified osteoarthritis, unspecified site: Secondary | ICD-10-CM | POA: Diagnosis not present

## 2020-11-14 DIAGNOSIS — J9611 Chronic respiratory failure with hypoxia: Secondary | ICD-10-CM | POA: Diagnosis not present

## 2020-11-14 DIAGNOSIS — E119 Type 2 diabetes mellitus without complications: Secondary | ICD-10-CM | POA: Diagnosis not present

## 2020-11-14 DIAGNOSIS — I272 Pulmonary hypertension, unspecified: Secondary | ICD-10-CM | POA: Diagnosis not present

## 2020-11-14 DIAGNOSIS — R41841 Cognitive communication deficit: Secondary | ICD-10-CM | POA: Diagnosis not present

## 2020-11-14 DIAGNOSIS — I119 Hypertensive heart disease without heart failure: Secondary | ICD-10-CM | POA: Diagnosis not present

## 2020-11-14 DIAGNOSIS — D509 Iron deficiency anemia, unspecified: Secondary | ICD-10-CM | POA: Diagnosis not present

## 2020-11-14 DIAGNOSIS — E662 Morbid (severe) obesity with alveolar hypoventilation: Secondary | ICD-10-CM | POA: Diagnosis not present

## 2020-11-14 DIAGNOSIS — G4733 Obstructive sleep apnea (adult) (pediatric): Secondary | ICD-10-CM | POA: Diagnosis not present

## 2020-11-14 DIAGNOSIS — E785 Hyperlipidemia, unspecified: Secondary | ICD-10-CM | POA: Diagnosis not present

## 2020-11-14 DIAGNOSIS — K449 Diaphragmatic hernia without obstruction or gangrene: Secondary | ICD-10-CM | POA: Diagnosis not present

## 2020-11-14 DIAGNOSIS — Z9981 Dependence on supplemental oxygen: Secondary | ICD-10-CM | POA: Diagnosis not present

## 2020-11-14 DIAGNOSIS — I4821 Permanent atrial fibrillation: Secondary | ICD-10-CM | POA: Diagnosis not present

## 2020-11-14 DIAGNOSIS — F419 Anxiety disorder, unspecified: Secondary | ICD-10-CM | POA: Diagnosis not present

## 2020-11-14 DIAGNOSIS — Z6838 Body mass index (BMI) 38.0-38.9, adult: Secondary | ICD-10-CM | POA: Diagnosis not present

## 2020-11-14 DIAGNOSIS — Z87891 Personal history of nicotine dependence: Secondary | ICD-10-CM | POA: Diagnosis not present

## 2020-11-14 DIAGNOSIS — Z7984 Long term (current) use of oral hypoglycemic drugs: Secondary | ICD-10-CM | POA: Diagnosis not present

## 2020-11-14 DIAGNOSIS — G4730 Sleep apnea, unspecified: Secondary | ICD-10-CM | POA: Diagnosis not present

## 2020-11-27 ENCOUNTER — Ambulatory Visit: Payer: Medicare Other | Admitting: Cardiology

## 2020-12-23 ENCOUNTER — Institutional Professional Consult (permissible substitution): Payer: Medicare Other | Admitting: Pulmonary Disease

## 2021-07-19 NOTE — Progress Notes (Signed)
Cardiology Office Note  Date: 07/19/2021   ID: Andrea AreaYvonne H Viele, DOB December 06, 1940, MRN [id number] At 160/80.  PCP:  Ignatius SpeckingVyas, Dhruv B, MD  Cardiologist:  None Electrophysiologist:  None   Chief Complaint: Dyspnea   History of Present Illness: Andrea [name] is a [age] y.o. female with a history of permanent atrial fibrillation and hypertension.  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  She cannot take anticoagulation due to history of GI bleeding.  Previous visit with me on 04/16/2020 stated she was having issues with shortness of breath and significant exertional fatigue.  She stated she stopped both her Cardizem and metoprolol more than 6 months ago and stated the only thing that was helping her was her clonazepam.  She arrived with an EKG showing atrial fibrillation with RVR rate of 115.  Her blood pressure was elevated at 160/80.  She had been to the atrial fibrillation clinic in the past and seen Sharilyn Sitesonna Caroll.  She stated one of the reasons she stopped the medication was she felt terrible and she was having dizziness and some nausea occasionally.  She has iron deficiency anemia and receives intermittent iron infusions.  She was nable to take anticoagulants due to history of GI bleeding.  Previous visit with me on 05/28/2020.  Her heart rate was much better controlled with a rate of 61.  Stated she still had issues with fatigue and dyspnea on exertion.  She admitted to excessive daytime sleepiness.  She had oxygen available at night but was not wearing it.  She had not had follow-up regarding her sleep apnea.  She stated she had the device at home but had never been shown how to use it.  Her blood pressure continued to be elevated.  Stated she continued to have occasional palpitations without any associated presyncopal or syncopal episodes, lightheadedness, or dizziness.   At last follow-up October 2021 she initially stated she was only taking Klonopin for her palpitations.  We had previously prescribed  Toprol in addition to Cardizem for heart rate control.  Later we did reduce the dose of Toprol due to slow heart rate.  We added lisinopril 10 at a previous visit for better blood pressure control.  She had not filled either medication.  She had not filled her Toprol or her lisinopril due to cost.  She had not followed up with her PCP regarding need for CPAP therapy.  She stated she had the machine but was never instructed as how to use it.  It appears there may be some cognitive issues as well as knowledge deficit regarding why she is taking medications and the reason she needs CPAP for her sleep apnea.  She is here today to with complaints of dyspnea.  She states she has a fair amount of exertional dyspnea.  She states she gets short of breath very easily.  She is not very active on a daily basis.  She states she has issues with balance, weakness in her legs along with numbness in her legs.  She states she thinks she needs physical therapy.  She states she has had physical therapy in the past which helped her.  She has a new diagnosis of diabetes and was recently started on metformin by her PCP.  She has only 3 bottles of medications with her which include metformin, Cardizem, and Klonopin.  We have had issues with her compliance with blood pressure medicines in the past.  She is supposed to be on both lisinopril and metoprolol.  Neither of which she has with her.  She actually states the 3 bottles of medications which she poor from her pocket book with the only medication she was currently taking.  Her grandson is with her and we discussed the fact that she was not taking her lisinopril or metoprolol.  I asked him to check on these medications when they get home to make sure she had these medications at home and needed to be taking them.  I advised him if not to call us and we would prescribe the lisinopril and metoprolol if she currently did not have them available.     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     BREO ELLIPTA 100-25 MCG/INH AEPB Inhale 1 puff into the lungs daily.      citalopram (CELEXA) 20 MG tablet Take 20 mg by mouth daily.     clonazePAM (KLONOPIN) 0.5 MG tablet Take 1 tablet (0.5 mg total) by mouth 2 (two) times daily as needed for anxiety. 20 tablet 0   diltiazem (CARDIZEM CD) 360 MG 24 hr capsule Take 1 capsule (360 mg total) by mouth daily. 90 capsule 3   insulin aspart (NOVOLOG) 100 UNIT/ML injection Subcutaneously three times daily before meals based on following sliding scale:  CBG 70 - 120: 0 units CBG 121 - 150: 2 units CBG 151 - 200: 3 units CBG 201 - 250: 5 units CBG 251 - 300: 8 units CBG 301 - 350: 11 units CBG 351 - 400: 15 units CBG > 400: call MD and obtain STAT lab verification 10 mL 11   insulin glargine (LANTUS) 100 UNIT/ML injection Inject 0.15 mLs (15 Units total) into the skin at bedtime. 10 mL 11   ipratropium-albuterol (DUONEB) 0.5-2.5 (3) MG/3ML SOLN Take 3 mLs by nebulization every 4 (four) hours as needed. 360 mL    ipratropium-albuterol (DUONEB) 0.5-2.5 (3) MG/3ML SOLN Take 3 mLs by nebulization 2 (two) times daily. 360 mL    lisinopril (ZESTRIL) 20 MG tablet Take 1  tablet (20 mg total) by mouth daily. 90 tablet 3   metoprolol tartrate (LOPRESSOR) 25 MG tablet Take 1 tablet (25 mg total) by mouth 2 (two) times daily.     OXYGEN Inhale 3 L into the lungs at bedtime.     polyethylene glycol (MIRALAX / GLYCOLAX) 17 g packet Take 17 g by mouth daily. 14 each 0   predniSONE (DELTASONE) 20 MG tablet Take 3 tablets once daily for 3 days followed by 2 tablets once daily for 3 days followed by 1 tablet once daily for 3 days  and then stop 18 tablet 0   senna (SENOKOT) 8.6 MG TABS tablet Take 2 tablets (17.2 mg total) by mouth at bedtime. 120 tablet 0   tetrahydrozoline-zinc (VISINE-AC) 0.05-0.25 % ophthalmic solution Place 2 drops into both eyes 3 (three) times daily as needed (Red Itchy Eyes).     No current facility-administered medications for this visit.   Allergies:  Shellfish allergy   Social History: The patient  reports that she quit smoking about 9 years ago. Her smoking use included cigarettes. She has a 10.00 pack-year smoking history. She has never used smokeless tobacco. She reports that she does not drink alcohol and does not use drugs.   Family History: The patient's family history includes Hypertension in her brother.   ROS:  Please see the history of present illness. Otherwise, complete review of systems is positive for none.  All other systems are reviewed and negative.   Physical Exam: VS:  There were no vitals taken for this visit., BMI There is no height or weight on file to calculate BMI.  Wt Readings from Last 3 Encounters:  08/22/20 229 lb 9.6 oz (104.1 kg)  05/28/20 223 lb 12.8 oz (101.5 kg)  04/16/20 223 lb (101.2 kg)    General: Obese patient appears comfortable at rest. Neck: Supple, no elevated JVP or carotid bruits, no thyromegaly. Lungs: Clear to auscultation, nonlabored breathing at rest. Cardiac: Irregularly irregular rate and rhythm, no S3 or significant systolic murmur, no pericardial rub. Extremities: No pitting edema,  distal pulses 2+. Skin: Warm and dry. Musculoskeletal: No kyphosis. Neuropsychiatric: Alert and oriented x3, affect grossly appropriate.  ECG:  An ECG dated 04/16/2020 was personally reviewed today and demonstrated:  Atrial fibrillation with RVR rate of 115.  Recent Labwork: 10/20/2020: ALT 12; AST 16; B Natriuretic Peptide 270.6; Magnesium 2.3 10/21/2020: TSH 0.353 10/24/2020: BUN 30; Creatinine, Ser 0.96; Hemoglobin 11.3; Platelets 369; Potassium 3.7; Sodium 138  No results found for: CHOL, TRIG, HDL, CHOLHDL, VLDL, LDLCALC, LDLDIRECT  Other Studies Reviewed Today:  Echocardiogram 05/21/2020   1. Left ventricular ejection fraction, by estimation, is 60 to 65%. The left ventricle has normal function. The left ventricle has no regional wall motion abnormalities. There is moderate left ventricular hypertrophy. Left ventricular diastolic parameters are indeterminate. 2. Right ventricular systolic function is normal. The right ventricular size is normal. There is normal pulmonary artery systolic pressure. 3. Left atrial size was severely dilated. 4. Right atrial size was mildly dilated. 5. The mitral valve is normal in structure. Trivial mitral valve regurgitation. No evidence of mitral stenosis. 6. The aortic valve is tricuspid. Aortic valve regurgitation is mild. No aortic stenosis is present. 7. The inferior vena cava is normal in size with greater than 50% respiratory variability, suggesting right atrial pressure of 3 mmHg. Comparison(s): Echocardiogram done 09/27/18 showed an EF of 60-65%.  Assessment and Plan:   1. Permanent atrial fibrillation (HCC) History of permanent atrial fibrillation.  At a prior visit visit patient stated she stopped taking her medications on her own greater than 6 months  stating the medications made her feel dizzy and nauseated.  We restarted her diltiazem 360 mg daily at previous visit.  Heart rate today 88 she states when she gets up to form any activity  she can feel her heart rate racing.  She is not on anticoagulants or aspirin due to history of GI bleed.   2. Essential hypertension Blood pressure is elevated today BP today 170/100.  At last visit we had started  lisinopril 20 mg daily and ordered a follow-up BMP and magnesium.  Today she does not have lisinopril and her medications.  She also does not have metoprolol in her medication bottles which she is supposed to be taking.  I spoke to her grandson [place] and asked him to check her medications when he gets home to make sure she has lisinopril and metoprolol.  I advised him if not to call us and we will refill these medications.  Blood pressures need to be under much better control.   3. OSA (obstructive sleep apnea)/COPD History of OSA but not compliant with CPAP.  She is on home oxygen 3 L at bedtime for COPD.   At last visit she stated she did have diagnosed sleep apnea and had the CPAP device at home but had never been shown how to use it.  She continued to complain of excessive fatigue and excessive daytime hypersomnolence.  At last visit she was not using her CPAP.   This was likely exacerbating her atrial fibrillation and explains her significant fatigue and activity intolerance along with deconditioning and morbid obesity.  Today she states she does not have sleep apnea.  She states she uses oxygen only at home.  4. History of GI bleed History of GI bleeding.  Therefore not on anticoagulants.  States she receives intermittent iron infusions due to history of IDA.  No recent GI bleeding.  5. Moderate pulmonary hypertension (HCC) History of elevated pulmonary pressures which may be contributing to her dyspnea as well as history of COPD.  Recent echocardiogram last year showed normal PASP.  Complaining of increased shortness of breath.  Please get a follow-up echocardiogram.  6.  Leg weakness, balance issues. Complaining of leg weakness and balance issues.  She states she is previously had  physical therapy for this problem.  Please refer to physical therapy at [facility] in [place], [place] to help with strength training/balance issues.  Medication Adjustments/Labs and Tests Ordered: Current medicines are reviewed at length with the patient today.  Concerns regarding medicines are outlined above.   Disposition: Follow-up with Dr. Wyline Mood to establish  Rennis Harding, NP 07/19/2021 9:27 PM    [facility] at [facility] [address] [place], [place], [place] [address] Phone: [phone]; Fax: [phone]

## 2021-07-20 ENCOUNTER — Ambulatory Visit (INDEPENDENT_AMBULATORY_CARE_PROVIDER_SITE_OTHER): Payer: Medicare HMO | Admitting: Family Medicine

## 2021-07-20 ENCOUNTER — Encounter: Payer: Self-pay | Admitting: Family Medicine

## 2021-07-20 ENCOUNTER — Other Ambulatory Visit: Payer: Self-pay

## 2021-07-20 VITALS — BP 170/100 | HR 88 | Ht 63.0 in | Wt 223.2 lb

## 2021-07-20 DIAGNOSIS — R0602 Shortness of breath: Secondary | ICD-10-CM

## 2021-07-20 DIAGNOSIS — I4821 Permanent atrial fibrillation: Secondary | ICD-10-CM | POA: Diagnosis not present

## 2021-07-20 DIAGNOSIS — G4733 Obstructive sleep apnea (adult) (pediatric): Secondary | ICD-10-CM

## 2021-07-20 DIAGNOSIS — I10 Essential (primary) hypertension: Secondary | ICD-10-CM | POA: Diagnosis not present

## 2021-07-20 DIAGNOSIS — Z8719 Personal history of other diseases of the digestive system: Secondary | ICD-10-CM | POA: Diagnosis not present

## 2021-07-20 DIAGNOSIS — I2721 Secondary pulmonary arterial hypertension: Secondary | ICD-10-CM

## 2021-07-20 DIAGNOSIS — R29898 Other symptoms and signs involving the musculoskeletal system: Secondary | ICD-10-CM

## 2021-07-20 NOTE — Patient Instructions (Addendum)
Medication Instructions:  Your physician recommends that you continue on your current medications as directed. Please refer to the Current Medication list given to you today. Please call our office with medication clarification  Labwork: none  Testing/Procedures: Your physician has requested that you have an echocardiogram. Echocardiography is a painless test that uses sound waves to create images of your heart. It provides your doctor with information about the size and shape of your heart and how well your heart's chambers and valves are working. This procedure takes approximately one hour. There are no restrictions for this procedure.  Follow-Up: Your physician recommends that you schedule a follow-up appointment in: 3 months  Any Other Special Instructions Will Be Listed Below (If Applicable). You have been referred to Physical Therapy  If you need a refill on your cardiac medications before your next appointment, please call your pharmacy.

## 2021-07-21 ENCOUNTER — Other Ambulatory Visit: Payer: Self-pay | Admitting: *Deleted

## 2021-07-21 ENCOUNTER — Telehealth: Payer: Self-pay | Admitting: Family Medicine

## 2021-07-21 MED ORDER — LISINOPRIL 20 MG PO TABS
20.0000 mg | ORAL_TABLET | Freq: Every day | ORAL | 2 refills | Status: DC
Start: 2021-07-21 — End: 2022-08-27

## 2021-07-21 MED ORDER — METOPROLOL TARTRATE 25 MG PO TABS
25.0000 mg | ORAL_TABLET | Freq: Two times a day (BID) | ORAL | 2 refills | Status: DC
Start: 2021-07-21 — End: 2022-03-30

## 2021-07-21 NOTE — Telephone Encounter (Signed)
Called to report that she did not have lisinopril or metoprolol at home. Advised that refills will be sent to her pharmacy now. Aware to start them and be compliant.

## 2021-07-21 NOTE — Telephone Encounter (Signed)
Checking percert on the following patient for testing scheduled at [facility].     ECHO   08/11/2021

## 2021-08-11 ENCOUNTER — Other Ambulatory Visit: Payer: Self-pay

## 2021-08-11 ENCOUNTER — Ambulatory Visit (HOSPITAL_COMMUNITY)
Admission: RE | Admit: 2021-08-11 | Discharge: 2021-08-11 | Disposition: A | Discharge status: Home / Self Care | Payer: Medicare HMO | Source: Ambulatory Visit | Attending: Family Medicine | Admitting: Family Medicine

## 2021-08-11 DIAGNOSIS — R0602 Shortness of breath: Secondary | ICD-10-CM | POA: Insufficient documentation

## 2021-08-11 LAB — ECHOCARDIOGRAM COMPLETE
AR max vel: 2.23 cm2
AV Peak grad: 8.1 mmHg
Ao pk vel: 1.42 m/s
Area-P 1/2: 3.06 cm2
S' Lateral: 3.1 cm
Single Plane A4C EF: 54.9 %

## 2021-08-11 NOTE — Progress Notes (Signed)
  Echocardiogram 2D Echocardiogram has been performed.  Andrea [name] 08/11/2021, 10:59 AM

## 2021-08-14 ENCOUNTER — Telehealth: Payer: Self-pay | Admitting: *Deleted

## 2021-08-14 NOTE — Telephone Encounter (Signed)
Lesle Chris, LPN  3/36/1224  9:04 AM EDT Back to Top    Notified, copy to pcp.

## 2021-08-14 NOTE — Telephone Encounter (Signed)
-----   Message from Netta Neat., NP sent at 08/12/2021  2:34 PM EDT ----- Please call the patient and let her know the echocardiogram shows she has good pumping function of the heart. The main pumping chamber is moderately more muscular than normal. The best management is to keep BP at or below 130/80 consistently and manage all other risk factors. 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 This is common with the aging process. There are no significant changes from previous echocardiogram. Thanks    Netta Neat, NP  08/12/2021 2:28 PM

## 2021-09-23 ENCOUNTER — Encounter (HOSPITAL_COMMUNITY): Payer: Self-pay

## 2021-09-23 ENCOUNTER — Emergency Department (HOSPITAL_COMMUNITY)
Admission: EM | Admit: 2021-09-23 | Discharge: 2021-09-23 | Disposition: A | Discharge status: Home / Self Care | Payer: Medicare HMO | Attending: Emergency Medicine | Admitting: Emergency Medicine

## 2021-09-23 ENCOUNTER — Other Ambulatory Visit: Payer: Self-pay

## 2021-09-23 ENCOUNTER — Emergency Department (HOSPITAL_COMMUNITY): Payer: Medicare HMO

## 2021-09-23 DIAGNOSIS — H6121 Impacted cerumen, right ear: Secondary | ICD-10-CM | POA: Diagnosis not present

## 2021-09-23 DIAGNOSIS — I10 Essential (primary) hypertension: Secondary | ICD-10-CM | POA: Diagnosis not present

## 2021-09-23 DIAGNOSIS — Z79899 Other long term (current) drug therapy: Secondary | ICD-10-CM | POA: Diagnosis not present

## 2021-09-23 DIAGNOSIS — J449 Chronic obstructive pulmonary disease, unspecified: Secondary | ICD-10-CM | POA: Diagnosis not present

## 2021-09-23 DIAGNOSIS — R1032 Left lower quadrant pain: Secondary | ICD-10-CM

## 2021-09-23 DIAGNOSIS — K5792 Diverticulitis of intestine, part unspecified, without perforation or abscess without bleeding: Secondary | ICD-10-CM | POA: Insufficient documentation

## 2021-09-23 DIAGNOSIS — Z87891 Personal history of nicotine dependence: Secondary | ICD-10-CM | POA: Diagnosis not present

## 2021-09-23 DIAGNOSIS — R42 Dizziness and giddiness: Secondary | ICD-10-CM | POA: Diagnosis not present

## 2021-09-23 DIAGNOSIS — M25552 Pain in left hip: Secondary | ICD-10-CM | POA: Diagnosis not present

## 2021-09-23 LAB — BASIC METABOLIC PANEL
Anion gap: 10 (ref 5–15)
BUN: 14 mg/dL (ref 8–23)
CO2: 25 mmol/L (ref 22–32)
Calcium: 9.2 mg/dL (ref 8.9–10.3)
Chloride: 103 mmol/L (ref 98–111)
Creatinine, Ser: 0.97 mg/dL (ref 0.44–1.00)
GFR, Estimated: 58 mL/min — ABNORMAL LOW (ref >60)
Glucose, Bld: 224 mg/dL — ABNORMAL HIGH (ref 70–99)
Potassium: 4.2 mmol/L (ref 3.5–5.1)
Sodium: 138 mmol/L (ref 135–145)

## 2021-09-23 LAB — CBC
HCT: 34.7 % — ABNORMAL LOW (ref 36.0–46.0)
Hemoglobin: 10.2 g/dL — ABNORMAL LOW (ref 12.0–15.0)
MCH: 22.2 pg — ABNORMAL LOW (ref 26.0–34.0)
MCHC: 29.4 g/dL — ABNORMAL LOW (ref 30.0–36.0)
MCV: 75.4 fL — ABNORMAL LOW (ref 80.0–100.0)
Platelets: 437 10*3/uL — ABNORMAL HIGH (ref 150–400)
RBC: 4.6 MIL/uL (ref 3.87–5.11)
RDW: 18.5 % — ABNORMAL HIGH (ref 11.5–15.5)
WBC: 9 10*3/uL (ref 4.0–10.5)
nRBC: 0 % (ref 0.0–0.2)

## 2021-09-23 LAB — CBG MONITORING, ED: Glucose-Capillary: 241 mg/dL — ABNORMAL HIGH (ref 70–99)

## 2021-09-23 MED ORDER — AMOXICILLIN-POT CLAVULANATE 500-125 MG PO TABS: 1.0000 | ORAL_TABLET | Freq: Three times a day (TID) | ORAL | 0 refills | Status: DC

## 2021-09-23 MED ORDER — HYDROGEN PEROXIDE 3 % EX SOLN
Freq: Once | CUTANEOUS | Status: AC
  Filled 2021-09-23: qty 473

## 2021-09-23 MED ORDER — AMOXICILLIN-POT CLAVULANATE 875-125 MG PO TABS
1.0000 | ORAL_TABLET | Freq: Once | ORAL | Status: AC
  Administered 2021-09-23: 1 via ORAL
  Filled 2021-09-23: qty 1

## 2021-09-23 MED ORDER — SODIUM CHLORIDE 0.9 % IV BOLUS
500.0000 mL | Freq: Once | INTRAVENOUS | Status: AC
  Administered 2021-09-23: 500 mL via INTRAVENOUS

## 2021-09-23 NOTE — ED Triage Notes (Addendum)
Pt presents to ED with complaints of left hip pain started yesterday, denies injury. Pt also states she has been dizzy x 1 week and off balance when she walks, thinks it is coming from her left ear being stopped up.

## 2021-09-23 NOTE — Discharge Instructions (Addendum)
You are seen in the ER for abdominal pain.  The pain is on the left side, we suspect that you most likely have diverticulitis.  We are giving you antibiotics for it.  Take the medications prescribed, follow-up with your primary care doctor in 7 days.  Please return to the ER if your symptoms worsen; you have increased pain, fevers, chills, inability to keep any medications down, confusion. Otherwise see the outpatient doctor as requested.

## 2021-09-23 NOTE — ED Provider Notes (Signed)
[facility] EMERGENCY DEPARTMENT Provider Note   CSN: [id number] Arrival date & time: 09/23/21  1217     History Chief Complaint  Patient presents with   Hip Pain   Dizziness    Andrea [name] is a 80 y.o. female.  HPI     80 year old female comes in with chief complaint of hip pain and dizziness.  She has history of A. Fib and osteoarthritis.  She reports that her hip pain has been present for 2 days.  The pain is left-sided and moves up towards her abdomen.  The pain feels to be deep.  She has no history of hip pain.  She suspects the pain is because of arthritis.  Patient also reports having dizziness and headache, but neither of those symptoms are new and have been present for several months.  She is indicating that her left ear is clogged up, she has some hearing loss on that side without any ringing in the ear.  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   Obesity    Osteoarthritis    Sleep apnea 07/08/2014   Tobacco abuse     Patient Active Problem List   Diagnosis Date Noted   Hyperlipidemia 10/20/2020   Chronic respiratory failure with hypoxia (HCC) 10/20/2020   COPD with acute exacerbation (HCC) 10/20/2020   Prediabetes 10/20/2020   COPD exacerbation (HCC) 10/20/2020   Anxiety 08/12/2016   Permanent atrial fibrillation with rapid ventricular response (HCC) 08/12/2016   History of iron deficiency anemia 08/12/2016   History of GI bleed 08/12/2016   Dyspnea    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     Family History  Problem Relation Age of Onset   Hypertension Brother     Social History   Tobacco Use   Smoking status: Former    Packs/day: 0.50    Years: 20.00    Pack years: 10.00    Types: Cigarettes    Quit date: 06/22/2012    Years since quitting: 9.2   Smokeless tobacco: Never  Vaping Use   Vaping Use: Never used  Substance Use Topics   Alcohol use: No   Drug use: No    Home Medications Prior to Admission medications   Medication Sig Start Date End Date Taking? Authorizing Provider  amoxicillin-clavulanate (AUGMENTIN) 500-125 MG tablet Take 1 tablet (500 mg total) by mouth every 8 (eight) hours. 09/23/21  Yes Vishwa Dais, MD  BREO ELLIPTA 100-25 MCG/INH AEPB Inhale 1 puff into the lungs daily.  07/12/16  Yes [provider]  clonazePAM (KLONOPIN) 0.5 MG tablet Take 0.5 mg by mouth daily as needed for anxiety.  Yes [provider]  lisinopril (ZESTRIL) 20 MG tablet Take 1 tablet (20 mg total) by mouth daily. 07/21/21  Yes Netta Neat., NP  metFORMIN (GLUCOPHAGE) 850 MG tablet Take 850 mg by mouth daily as needed. 07/02/21  Yes [provider]  metoprolol tartrate (LOPRESSOR) 25 MG tablet Take 1 tablet (25 mg total) by mouth 2 (two) times daily. 07/21/21  Yes Netta Neat., NP  OXYGEN Inhale 3 L into the lungs at bedtime.   Yes [provider]  tetrahydrozoline-zinc (VISINE-AC) 0.05-0.25 % ophthalmic solution Place 2 drops into both eyes 3 (three) times daily as needed (Red Itchy Eyes).   Yes [provider]  acetaminophen (TYLENOL) 500 MG tablet Take 1,000 mg by mouth every 6 (six) hours as needed for mild pain.    [provider]  diltiazem (CARDIZEM CD) 360 MG 24 hr capsule Take 1 capsule (360 mg total) by mouth daily. Patient not taking: No sig reported 04/16/20   Netta Neat., NP  ipratropium-albuterol (DUONEB) 0.5-2.5 (3) MG/3ML SOLN Take 3 mLs by nebulization every 4 (four) hours as needed. Patient not taking: No sig reported 10/24/20   Osvaldo Shipper, MD    Allergies    Shellfish allergy  Review of Systems   Review of Systems  Constitutional:  Positive for activity change.  HENT:  Negative for ear discharge.   Respiratory:  Negative for shortness of breath.   Cardiovascular:  Negative for chest pain.  Gastrointestinal:  Positive for abdominal pain.  Genitourinary:  Negative for dysuria.  Musculoskeletal:  Positive for arthralgias.  Neurological:  Positive for dizziness.  All other systems reviewed and are negative.  Physical Exam Updated Vital Signs BP 119/74   Pulse 73   Temp 98.6 F (37 C) (Oral)   Resp (!) 24   Ht 5\' 3"  (1.6 m)   Wt 101.2 kg   SpO2 97%   BMI 39.50 kg/m   Physical Exam Vitals and nursing note reviewed.  Constitutional:      Appearance: She is well-developed.  HENT:     Head: Atraumatic.     Right Ear: There is impacted cerumen.  Eyes:     Extraocular Movements: Extraocular movements intact.     Pupils: Pupils are equal, round, and reactive to light.  Cardiovascular:     Rate and Rhythm: Normal rate.  Pulmonary:     Effort: Pulmonary effort is normal.  Abdominal:     Tenderness: There is abdominal tenderness.     Comments: Left inguinal and lower quadrant abdominal tenderness without rebound or guarding  Musculoskeletal:     Cervical back: Normal range of motion and neck supple.  Skin:    General: Skin is warm and dry.  Neurological:     Mental Status: She is alert and oriented to person, place, and time.     Cranial Nerves: No cranial nerve deficit.     Sensory: No sensory deficit.     Motor: No weakness.     Coordination: Coordination normal.     ED Results / Procedures / Treatments   Labs (all labs ordered are listed, but only abnormal results are displayed) Labs Reviewed  BASIC METABOLIC PANEL - Abnormal; Notable for the following components:      Result Value   Glucose, Bld 224 (*)    GFR, Estimated 58 (*)    All other components within normal limits  CBC - Abnormal; Notable for the following components:   Hemoglobin  10.2 (*)    HCT 34.7 (*)    MCV 75.4 (*)    MCH 22.2 (*)    MCHC 29.4 (*)    RDW 18.5 (*)    Platelets 437 (*)    All other components within normal limits  CBG MONITORING, ED - Abnormal; Notable for the following components:   Glucose-Capillary 241 (*)    All other components within normal limits  URINALYSIS, ROUTINE W REFLEX MICROSCOPIC    EKG EKG Interpretation  Date/Time:  Wednesday September 23 2021 12:47:25 EDT Ventricular Rate:  62 PR Interval:    QRS Duration: 82 QT Interval:  394 QTC Calculation: 399 R Axis:   -16 Text Interpretation: Atrial fibrillation Anterior infarct , age undetermined Abnormal ECG No acute changes No significant change since last tracing Confirmed by Derwood Kaplan [id number]) on 09/23/2021 3:12:18 PM  Radiology DG Hip Unilat W or Wo Pelvis 2-3 Views Left  Result Date: 09/23/2021 CLINICAL DATA:  Left hip pain since yesterday EXAM: DG HIP (WITH OR WITHOUT PELVIS) 2-3V LEFT COMPARISON:  None. FINDINGS: There is no evidence of hip fracture or dislocation. Degenerative narrowing right greater than left acetabulum and right greater than left sacroiliac joint. Vascular calcifications. Evaluation of sacrum is limited by overlying bowel gas IMPRESSION: No acute fracture or dislocation. Electronically Signed   By: Wiliam Ke M.D.   On: 09/23/2021 13:04    Procedures Procedures   Medications Ordered in ED Medications  amoxicillin-clavulanate (AUGMENTIN) 875-125 MG per tablet 1 tablet (has no administration in time range)  sodium chloride 0.9 % bolus 500 mL (0 mLs  Intravenous Stopped 09/23/21 1812)  hydrogen peroxide 3 % external solution ( Topical Given 09/23/21 1633)    ED Course  I have reviewed the triage vital signs and the nursing notes.  Pertinent labs & imaging results that were available during my care of the patient were reviewed by me and considered in my medical decision making (see chart for details).  Clinical Course as of 09/23/21 1826  Wed Sep 23, 2021  7341 Patient reassessed.  Continues to have no significant changes to the abdominal exam.  Clinically suspicion is high for diverticulitis.  Patient has not given [place] urine sample, but she denies any burning with urination, urinary frequency.  There is no back pain.  We will discharge with Augmentin at this time [AN]    Clinical Course User Index [AN] Derwood Kaplan, MD   MDM Rules/Calculators/A&P                           80 year old female with history of A. Fib, COPD and diverticulosis comes in with chief complaint of left-sided hip pain.  She is reporting that the pain is radiating up to her left side of the abdomen and it is a a deep pain.  Pain is not worse with hip flexion and extension.  There is some abdominal tenderness without any rebound or guarding.  Patient exam, suspicion for osteoarthritis or musculoskeletal pain of the hip is lower. She has history of diverticulosis, clinically it appears that diverticulitis is possible along with UTI.  Basic labs have been reviewed and are reassuring.  No elevated white count.  UA pending at this time.  I do not think a CT is needed as I think most likely this will be a case of diverticulitis without complications.  It might be safe for patient to be put on antibiotics with wait and watch approach on  return precautions and a PCP follow-up within few days.  She also has impacted left ear which will be irrigated in the ED. Her headache, dizziness are not new.  Final Clinical Impression(s) / ED Diagnoses Final diagnoses:   Diverticulitis  Left lower quadrant abdominal pain    Rx / DC Orders ED Discharge Orders          Ordered    amoxicillin-clavulanate (AUGMENTIN) 500-125 MG tablet  Every 8 hours        09/23/21 1823             Derwood Kaplan, MD 09/23/21 1826

## 2021-09-23 NOTE — ED Notes (Signed)
Ma and NT JG pulled pt up in bed

## 2021-11-03 ENCOUNTER — Ambulatory Visit: Payer: Medicare HMO | Admitting: Cardiology

## 2021-12-15 ENCOUNTER — Encounter (HOSPITAL_COMMUNITY): Payer: Self-pay | Admitting: Hematology

## 2021-12-15 ENCOUNTER — Other Ambulatory Visit (HOSPITAL_COMMUNITY): Payer: Self-pay | Admitting: *Deleted

## 2021-12-15 ENCOUNTER — Inpatient Hospital Stay (HOSPITAL_COMMUNITY): Payer: Medicare HMO | Attending: Hematology | Admitting: Hematology

## 2021-12-15 ENCOUNTER — Other Ambulatory Visit: Payer: Self-pay

## 2021-12-15 ENCOUNTER — Inpatient Hospital Stay (HOSPITAL_COMMUNITY): Payer: Medicare HMO

## 2021-12-15 VITALS — BP 185/91 | HR 95 | Temp 98.8°F | Resp 20 | Ht 62.99 in | Wt 212.4 lb

## 2021-12-15 DIAGNOSIS — R5383 Other fatigue: Secondary | ICD-10-CM | POA: Diagnosis not present

## 2021-12-15 DIAGNOSIS — Z87891 Personal history of nicotine dependence: Secondary | ICD-10-CM | POA: Diagnosis not present

## 2021-12-15 DIAGNOSIS — D509 Iron deficiency anemia, unspecified: Secondary | ICD-10-CM | POA: Insufficient documentation

## 2021-12-15 DIAGNOSIS — Z862 Personal history of diseases of the blood and blood-forming organs and certain disorders involving the immune mechanism: Secondary | ICD-10-CM

## 2021-12-15 LAB — COMPREHENSIVE METABOLIC PANEL
ALT: 11 U/L (ref 0–44)
AST: 16 U/L (ref 15–41)
Albumin: 4.2 g/dL (ref 3.5–5.0)
Alkaline Phosphatase: 134 U/L — ABNORMAL HIGH (ref 38–126)
Anion gap: 11 (ref 5–15)
BUN: 15 mg/dL (ref 8–23)
CO2: 23 mmol/L (ref 22–32)
Calcium: 9 mg/dL (ref 8.9–10.3)
Chloride: 103 mmol/L (ref 98–111)
Creatinine, Ser: 0.82 mg/dL (ref 0.44–1.00)
GFR, Estimated: >60 mL/min (ref >60)
Glucose, Bld: 130 mg/dL — ABNORMAL HIGH (ref 70–99)
Potassium: 3.6 mmol/L (ref 3.5–5.1)
Sodium: 137 mmol/L (ref 135–145)
Total Bilirubin: 0.7 mg/dL (ref 0.3–1.2)
Total Protein: 7.7 g/dL (ref 6.5–8.1)

## 2021-12-15 LAB — RETICULOCYTES
Immature Retic Fract: 25.9 % — ABNORMAL HIGH (ref 2.3–15.9)
RBC.: 4.64 MIL/uL (ref 3.87–5.11)
Retic Count, Absolute: 84 10*3/uL (ref 19.0–186.0)
Retic Ct Pct: 1.8 % (ref 0.4–3.1)

## 2021-12-15 LAB — FOLATE: Folate: 7.3 ng/mL (ref >5.9)

## 2021-12-15 LAB — VITAMIN B12: Vitamin B-12: 189 pg/mL (ref 180–914)

## 2021-12-15 LAB — LACTATE DEHYDROGENASE: LDH: 176 U/L (ref 98–192)

## 2021-12-15 NOTE — Patient Instructions (Addendum)
[facility] Discharge Instructions   You were seen and examined today by Dr. Ellin Saba.  We will check additional blood work today to make sure you don't have any vitamin deficiencies causing your anemia.  We will schedule you for 2 iron infusions, 1 week apart.  We will see you back in 2 months with repeat lab work and office visit.    Thank you for choosing  [facility] at [facility] to provide your oncology and hematology care.  To afford each patient quality time with our provider, please arrive at least 15 minutes before your scheduled appointment time.   If you have a lab appointment with the Cancer Center please come in thru the Main Entrance and check in at the main information desk.  You need to re-schedule your appointment should you arrive 10 or more minutes late.  We strive to give you quality time with our providers, and arriving late affects you and other patients whose appointments are after yours.  Also, if you no show three or more times for appointments you may be dismissed from the clinic at the providers discretion.     Again, thank you for choosing [facility].  Our hope is that these requests will decrease the amount of time that you wait before being seen by our physicians.       _____________________________________________________________  Should you have questions after your visit to [facility], please contact our office at [phone] and follow the prompts.  Our office hours are 8:00 a.m. and 4:30 p.m. Monday - Friday.  Please note that voicemails left after 4:00 p.m. may not be returned until the following business day.  We are closed weekends and major holidays.  You do have access to a nurse 24-7, just call the main number to the clinic [phone] and do not press any options, hold on the line and a nurse will answer the phone.    For prescription refill requests, have  your pharmacy contact our office and allow 72 hours.    Due to Covid, you will need to wear a mask upon entering the hospital. If you do not have a mask, a mask will be given to you at the Main Entrance upon arrival. For doctor visits, patients may have 1 support person age 9 or older with them. For treatment visits, patients can not have anyone with them due to social distancing guidelines and our immunocompromised population.

## 2021-12-15 NOTE — Progress Notes (Signed)
Intravenous Iron Formulation Change  Andrea [name] has insurance that requires a change in intravenous iron product from Feraheme to Venofer. Orders have been updated to reflect this change and scheduling message sent to adjust infusion appointments. Dr Ellin Saba notified and agrees with the plan.  Allergies:  Allergies  Allergen Reactions   Sulfa Antibiotics Itching and Other (See Comments)    Burning   Shellfish Allergy Hives, Itching and Swelling    The plan for iron therapy is as follows: Venofer 300 mg IVPB x 2 and Venofer 400 mg x 1 with appropriate premedication.  Stephens Shire, PharmD 12/15/2021

## 2021-12-15 NOTE — Progress Notes (Signed)
[place] [address] [place] [address]   CLINIC:  Medical Oncology/Hematology  Patient Care Team: Glenda Chroman, MD as PCP - General (Internal Medicine)  CHIEF COMPLAINTS/PURPOSE OF CONSULTATION:  Evaluation of severe microcytic anemia  HISTORY OF PRESENTING ILLNESS:  Andrea [name] 81 y.o. female is here because of evaluation of anemia, at the request of Vyas.  Today she reports feeling well. She denies any current bleeding or black stools. She is not taking iron tablets, and she cannot recall if she has previously taken iron tablets. She reports ice pica. She reports fatigue, dizziness, and feeling off balance. She previously receiving regular iron infusions in 2017. She denies significant weight loss in the past 6 months. Her appetite is good and she denies abdominal pain and leg swellings.  She currently lives at home, and she is able to do her typical home activities independently. 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 She quit smoking 10 years ago after smoking 1 pack a week for 68 years. She denies family history of anemia. Her maternal aunt had breast cancer.   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   Obesity    Osteoarthritis    Sleep apnea 07/08/2014   Tobacco abuse     SURGICAL HISTORY: Past Surgical History:  Procedure Laterality Date   ABDOMINAL HYSTERECTOMY     GIVENS CAPSULE STUDY N/A 02/19/2013   Procedure: GIVENS CAPSULE STUDY;  Surgeon: Rogene Houston, MD;  Location: [facility];  Service: Endoscopy;  Laterality: N/A;  730    SOCIAL HISTORY: Social History   Socioeconomic History   Marital status: Widowed     Spouse name: Not on file   Number of children: Not on file   Years of education: Not on file   Highest education level: Not on file  Occupational History   Not on file  Tobacco Use   Smoking status: Former    Packs/day: 0.50    Years: 20.00    Pack years: 10.00    Types: Cigarettes    Quit date: 06/22/2012    Years since quitting: 9.4   Smokeless tobacco: Never  Vaping Use   Vaping Use: Never used  Substance and Sexual Activity   Alcohol use: No   Drug use: No   Sexual activity: Not on file  Other Topics Concern   Not on file  Social History Narrative   Not on file   Social Determinants of Health   Financial Resource Strain: Not on file  Food Insecurity: Not on file  Transportation Needs: Not on file  Physical Activity: Not on file  Stress: Not on file  Social Connections: Not on file  Intimate Partner Violence: Not on file    FAMILY HISTORY: Family History  Problem Relation Age of Onset   Hypertension Brother     ALLERGIES:  is allergic to shellfish allergy.  MEDICATIONS:  Current Outpatient Medications  Medication Sig Dispense Refill   acetaminophen (TYLENOL) 500 MG tablet Take 1,000 mg by mouth every 6 (six) hours as needed for mild pain.     amoxicillin-clavulanate (  AUGMENTIN) 500-125 MG tablet Take 1 tablet (500 mg total) by mouth every 8 (eight) hours. 20 tablet 0   BREO ELLIPTA 100-25 MCG/INH AEPB Inhale 1 puff into the lungs daily.      clonazePAM (KLONOPIN) 0.5 MG tablet Take 0.5 mg by mouth daily as needed for anxiety.     diltiazem (CARDIZEM CD) 360 MG 24 hr capsule Take 1 capsule (360 mg total) by mouth daily. (Patient not taking: No sig reported) 90 capsule 3   ipratropium-albuterol (DUONEB) 0.5-2.5 (3) MG/3ML SOLN Take 3 mLs by nebulization every 4 (four) hours as needed. (Patient not taking: No sig reported) 360 mL    lisinopril (ZESTRIL) 20 MG tablet Take 1 tablet (20 mg total) by mouth daily. 90 tablet 2   metFORMIN (GLUCOPHAGE) 850 MG tablet  Take 850 mg by mouth daily as needed.     metoprolol tartrate (LOPRESSOR) 25 MG tablet Take 1 tablet (25 mg total) by mouth 2 (two) times daily. 180 tablet 2   OXYGEN Inhale 3 L into the lungs at bedtime.     tetrahydrozoline-zinc (VISINE-AC) 0.05-0.25 % ophthalmic solution Place 2 drops into both eyes 3 (three) times daily as needed (Red Itchy Eyes).     No current facility-administered medications for this visit.    REVIEW OF SYSTEMS:   Review of Systems  Constitutional:  Positive for fatigue. Negative for appetite change and unexpected weight change.  HENT:   Negative for nosebleeds.   Respiratory:  Positive for cough and shortness of breath. Negative for hemoptysis.   Cardiovascular:  Positive for palpitations. Negative for leg swelling.  Gastrointestinal:  Positive for diarrhea and nausea. Negative for abdominal pain and blood in stool.  Genitourinary:  Positive for difficulty urinating (urgency). Negative for hematuria.   Musculoskeletal:  Positive for gait problem (off balance).  Neurological:  Positive for dizziness, gait problem (off balance) and headaches.  Hematological:  Does not bruise/bleed easily.  Psychiatric/Behavioral:  Positive for depression. The patient is nervous/anxious.   All other systems reviewed and are negative.   PHYSICAL EXAMINATION: ECOG PERFORMANCE STATUS: 2 - Symptomatic, <50% confined to bed  Vitals:   12/15/21 0937  BP: (!) 185/91  Pulse: 95  Resp: 20  Temp: 98.8 F (37.1 C)  SpO2: 97%   Filed Weights   12/15/21 0937  Weight: 212 lb 6.4 oz (96.3 kg)   Physical Exam Vitals reviewed.  Constitutional:      Appearance: Normal appearance.     Comments: In wheelchair  Cardiovascular:     Rate and Rhythm: Normal rate and regular rhythm.     Pulses: Normal pulses.     Heart sounds: Normal heart sounds.  Pulmonary:     Effort: Pulmonary effort is normal.     Breath sounds: Normal breath sounds.  Abdominal:     Palpations: Abdomen is soft.  There is no hepatomegaly, splenomegaly or mass.     Tenderness: There is no abdominal tenderness.  Musculoskeletal:     Right lower leg: No edema.     Left lower leg: No edema.  Lymphadenopathy:     Cervical: No cervical adenopathy.     Right cervical: No superficial cervical adenopathy.    Left cervical: No superficial cervical adenopathy.     Upper Body:     Right upper body: No supraclavicular, axillary or pectoral adenopathy.     Left upper body: No supraclavicular, axillary or pectoral adenopathy.  Neurological:     General: No focal deficit present.  Mental Status: She is alert and oriented to person, place, and time.  Psychiatric:        Mood and Affect: Mood normal.        Behavior: Behavior normal.     LABORATORY DATA:  I have reviewed the data as listed Recent Results (from the past 2160 hour(s))  CBG monitoring, ED     Status: Abnormal   Collection Time: 09/23/21 12:47 PM  Result Value Ref Range   Glucose-Capillary 241 (H) 70 - 99 mg/dL    Comment: Glucose reference range applies only to samples taken after fasting for at least 8 hours.  Basic metabolic panel     Status: Abnormal   Collection Time: 09/23/21  1:08 PM  Result Value Ref Range   Sodium 138 135 - 145 mmol/L   Potassium 4.2 3.5 - 5.1 mmol/L   Chloride 103 98 - 111 mmol/L   CO2 25 22 - 32 mmol/L   Glucose, Bld 224 (H) 70 - 99 mg/dL    Comment: Glucose reference range applies only to samples taken after fasting for at least 8 hours.   BUN 14 8 - 23 mg/dL   Creatinine, Ser 4.090.97 0.44 - 1.00 mg/dL   Calcium 9.2 8.9 - 81.110.3 mg/dL   GFR, Estimated 58 (L) >60 mL/min    Comment: (NOTE) Calculated using the CKD-EPI Creatinine Equation (2021)    Anion gap 10 5 - 15    Comment: Performed at [facility], [address] [place], [place] [address]  CBC     Status: Abnormal   Collection Time: 09/23/21  1:08 PM  Result Value Ref Range   WBC 9.0 4.0 - 10.5 K/uL   RBC 4.60 3.87 - 5.11 MIL/uL   Hemoglobin 10.2  (L) 12.0 - 15.0 g/dL   HCT 29.534.7 (L) 62.136.0 - 30.846.0 %   MCV 75.4 (L) 80.0 - 100.0 fL   MCH 22.2 (L) 26.0 - 34.0 pg   MCHC 29.4 (L) 30.0 - 36.0 g/dL   RDW 65.718.5 (H) 84.611.5 - 96.215.5 %   Platelets 437 (H) 150 - 400 K/uL   nRBC 0.0 0.0 - 0.2 %    Comment: Performed at [facility], [address] [place], [place] [address]    RADIOGRAPHIC STUDIES: I have personally reviewed the radiological images as listed and agreed with the findings in the report. No results found.  ASSESSMENT:  Severe microcytic anemia: - Patient seen at the request of Dr. Sherril CroonVyas - Labs on 12/01/2021 with hemoglobin 8.8, MCV 72 and low MCH, MCHC and high RDW.  WBC and PLT are normal. - Iron studies show percent saturation 3, TIBC 446, ferritin 22. - She has a history of receiving parenteral iron therapy, last time in 2017. - [place] 2 [place] ago  [place] reportedly normal other than 2 polyps removed.  History of 2 units of blood transfusion. - She reports some ice pica and severe fatigue.  Denies any bleeding per rectum or melena.   Social/family history: - She lives at home with her grandson.  She worked at [employer] prior to retirement.  She had exposure to some chemicals.  She quit smoking 10 years ago.  She smoked 1 pack/week for 50 years. - Maternal aunt had breast cancer.   PLAN:  Severe microcytic anemia: - Her iron indices and CBC indicates severe iron deficiency. - Will check for other nutritional deficiencies including B12, folate, MMA, copper.  We will also check LDH, reticulocyte count and SPEP. - We  will schedule her for Feraheme weekly x2 due to severe fatigue. - We discussed side effects including rare chance of anaphylactic reaction. - RTC 8 weeks for follow-up with repeat CBC, ferritin and iron panel.   All questions were answered. The patient knows to call the clinic with any problems, questions or concerns.  Derek Jack, MD 12/15/21 9:45 AM  [place] [phone]   I, Thana Ates, am acting as a scribe for Dr. Derek Jack.  I, Derek Jack MD, have reviewed the above documentation for accuracy and completeness, and I agree with the above.

## 2021-12-16 ENCOUNTER — Encounter (HOSPITAL_COMMUNITY): Payer: Medicare HMO | Admitting: Hematology

## 2021-12-16 ENCOUNTER — Encounter (HOSPITAL_COMMUNITY): Payer: Self-pay | Admitting: Hematology

## 2021-12-17 LAB — PROTEIN ELECTROPHORESIS, SERUM
A/G Ratio: 1 (ref 0.7–1.7)
Albumin ELP: 3.7 g/dL (ref 2.9–4.4)
Alpha-1-Globulin: 0.3 g/dL (ref 0.0–0.4)
Alpha-2-Globulin: 0.9 g/dL (ref 0.4–1.0)
Beta Globulin: 1.4 g/dL — ABNORMAL HIGH (ref 0.7–1.3)
Gamma Globulin: 1.1 g/dL (ref 0.4–1.8)
Globulin, Total: 3.7 g/dL (ref 2.2–3.9)
Total Protein ELP: 7.4 g/dL (ref 6.0–8.5)

## 2021-12-17 LAB — METHYLMALONIC ACID, SERUM: Methylmalonic Acid, Quantitative: 212 nmol/L (ref 0–378)

## 2021-12-17 LAB — COPPER, SERUM: Copper: 148 ug/dL (ref 80–158)

## 2021-12-22 ENCOUNTER — Other Ambulatory Visit: Payer: Self-pay

## 2021-12-22 ENCOUNTER — Inpatient Hospital Stay (HOSPITAL_COMMUNITY): Payer: Medicare HMO

## 2021-12-22 VITALS — BP 148/91 | HR 85 | Temp 98.3°F | Resp 18 | Ht 63.0 in | Wt 215.6 lb

## 2021-12-22 DIAGNOSIS — D500 Iron deficiency anemia secondary to blood loss (chronic): Secondary | ICD-10-CM

## 2021-12-22 DIAGNOSIS — D509 Iron deficiency anemia, unspecified: Secondary | ICD-10-CM | POA: Diagnosis not present

## 2021-12-22 MED ORDER — SODIUM CHLORIDE 0.9 % IV SOLN: Freq: Once | INTRAVENOUS | Status: AC

## 2021-12-22 MED ORDER — ACETAMINOPHEN 325 MG PO TABS
650.0000 mg | ORAL_TABLET | Freq: Once | ORAL | Status: AC
  Administered 2021-12-22: 650 mg via ORAL
  Filled 2021-12-22: qty 2

## 2021-12-22 MED ORDER — SODIUM CHLORIDE 0.9 % IV SOLN
300.0000 mg | Freq: Once | INTRAVENOUS | Status: AC
  Administered 2021-12-22: 300 mg via INTRAVENOUS
  Filled 2021-12-22: qty 300

## 2021-12-22 MED ORDER — LORATADINE 10 MG PO TABS
10.0000 mg | ORAL_TABLET | Freq: Once | ORAL | Status: AC
  Administered 2021-12-22: 10 mg via ORAL
  Filled 2021-12-22: qty 1

## 2021-12-22 NOTE — Patient Instructions (Signed)
[facility]  Discharge Instructions: Thank you for choosing [facility] to provide your oncology and hematology care.  If you have a lab appointment with the Cancer Center, please come in thru the Main Entrance and check in at the main information desk.  Wear comfortable clothing and clothing appropriate for easy access to any Portacath or PICC line.   We strive to give you quality time with your provider. You may need to reschedule your appointment if you arrive late (15 or more minutes).  Arriving late affects you and other patients whose appointments are after yours.  Also, if you miss three or more appointments without notifying the office, you may be dismissed from the clinic at the provider's discretion.      For prescription refill requests, have your pharmacy contact our office and allow 72 hours for refills to be completed.    Today you received Venofer IV iron infusion.     BELOW ARE SYMPTOMS THAT SHOULD BE REPORTED IMMEDIATELY: *FEVER GREATER THAN 100.4 F (38 C) OR HIGHER *CHILLS OR SWEATING *NAUSEA AND VOMITING THAT IS NOT CONTROLLED WITH YOUR NAUSEA MEDICATION *UNUSUAL SHORTNESS OF BREATH *UNUSUAL BRUISING OR BLEEDING *URINARY PROBLEMS (pain or burning when urinating, or frequent urination) *BOWEL PROBLEMS (unusual diarrhea, constipation, pain near the anus) TENDERNESS IN MOUTH AND THROAT WITH OR WITHOUT PRESENCE OF ULCERS (sore throat, sores in mouth, or a toothache) UNUSUAL RASH, SWELLING OR PAIN  UNUSUAL VAGINAL DISCHARGE OR ITCHING   Items with * indicate a potential emergency and should be followed up as soon as possible or go to the Emergency Department if any problems should occur.  Please show the CHEMOTHERAPY ALERT CARD or IMMUNOTHERAPY ALERT CARD at check-in to the Emergency Department and triage nurse.  Should you have questions after your visit or need to cancel or reschedule your appointment, please contact [facility]  [phone]  and follow the prompts.  Office hours are 8:00 a.m. to 4:30 p.m. Monday - Friday. Please note that voicemails left after 4:00 p.m. may not be returned until the following business day.  We are closed weekends and major holidays. You have access to a nurse at all times for urgent questions. Please call the main number to the clinic [phone] and follow the prompts.  For any non-urgent questions, you may also contact your provider using MyChart. We now offer e-Visits for anyone 18 and older to request care online for non-urgent symptoms. For details visit [email or web address].   Also download the MyChart app! Go to the app store, search "MyChart", open the app, select [facility], and log in with your MyChart username and password.  Due to Covid, a mask is required upon entering the hospital/clinic. If you do not have a mask, one will be given to you upon arrival. For doctor visits, patients may have 1 support person aged 18 or older with them. For treatment visits, patients cannot have anyone with them due to current Covid guidelines and our immunocompromised population.  

## 2021-12-22 NOTE — Progress Notes (Signed)
Pt presents today for Venofer IV iron infusion per provider's order. Vital signs stable and pt voiced no new complaints at this time.  ? ?Peripheral IV started with good blood return pre and post infusion. ? ?Venofer 300 mg given today per MD orders. Tolerated infusion without adverse affects. Vital signs stable. No complaints at this time. Discharged from clinic via wheelchair in stable condition. Alert and oriented x 3. F/U with [facility] as scheduled.   ?

## 2021-12-29 ENCOUNTER — Other Ambulatory Visit: Payer: Self-pay

## 2021-12-29 ENCOUNTER — Inpatient Hospital Stay (HOSPITAL_COMMUNITY): Payer: Medicare HMO | Attending: Hematology

## 2021-12-29 ENCOUNTER — Encounter (HOSPITAL_COMMUNITY): Payer: Self-pay

## 2021-12-29 VITALS — BP 126/69 | HR 79 | Temp 98.6°F | Resp 18

## 2021-12-29 DIAGNOSIS — D500 Iron deficiency anemia secondary to blood loss (chronic): Secondary | ICD-10-CM

## 2021-12-29 DIAGNOSIS — D509 Iron deficiency anemia, unspecified: Secondary | ICD-10-CM | POA: Diagnosis not present

## 2021-12-29 DIAGNOSIS — Z87891 Personal history of nicotine dependence: Secondary | ICD-10-CM | POA: Diagnosis not present

## 2021-12-29 MED ORDER — SODIUM CHLORIDE 0.9 % IV SOLN: Freq: Once | INTRAVENOUS | Status: AC

## 2021-12-29 MED ORDER — LORATADINE 10 MG PO TABS
10.0000 mg | ORAL_TABLET | Freq: Once | ORAL | Status: AC
  Administered 2021-12-29: 10 mg via ORAL
  Filled 2021-12-29: qty 1

## 2021-12-29 MED ORDER — ACETAMINOPHEN 325 MG PO TABS
650.0000 mg | ORAL_TABLET | Freq: Once | ORAL | Status: AC
  Administered 2021-12-29: 650 mg via ORAL
  Filled 2021-12-29: qty 2

## 2021-12-29 MED ORDER — SODIUM CHLORIDE 0.9 % IV SOLN
300.0000 mg | Freq: Once | INTRAVENOUS | Status: AC
  Administered 2021-12-29: 300 mg via INTRAVENOUS
  Filled 2021-12-29: qty 300

## 2021-12-29 NOTE — Progress Notes (Signed)
Patient tolerated iron infusion with no complaints voiced.  Peripheral IV site clean and dry with good blood return noted before and after infusion.  Band aid applied.  VSS with discharge and left in satisfactory condition with no s/s of distress noted.   

## 2021-12-29 NOTE — Patient Instructions (Signed)
[facility]  Discharge Instructions: Thank you for choosing [facility] to provide your oncology and hematology care.  If you have a lab appointment with the Cancer Center, please come in thru the Main Entrance and check in at the main information desk.  Wear comfortable clothing and clothing appropriate for easy access to any Portacath or PICC line.   We strive to give you quality time with your provider. You may need to reschedule your appointment if you arrive late (15 or more minutes).  Arriving late affects you and other patients whose appointments are after yours.  Also, if you miss three or more appointments without notifying the office, you may be dismissed from the clinic at the provider's discretion.      For prescription refill requests, have your pharmacy contact our office and allow 72 hours for refills to be completed.        To help prevent nausea and vomiting after your treatment, we encourage you to take your nausea medication as directed.  BELOW ARE SYMPTOMS THAT SHOULD BE REPORTED IMMEDIATELY: *FEVER GREATER THAN 100.4 F (38 C) OR HIGHER *CHILLS OR SWEATING *NAUSEA AND VOMITING THAT IS NOT CONTROLLED WITH YOUR NAUSEA MEDICATION *UNUSUAL SHORTNESS OF BREATH *UNUSUAL BRUISING OR BLEEDING *URINARY PROBLEMS (pain or burning when urinating, or frequent urination) *BOWEL PROBLEMS (unusual diarrhea, constipation, pain near the anus) TENDERNESS IN MOUTH AND THROAT WITH OR WITHOUT PRESENCE OF ULCERS (sore throat, sores in mouth, or a toothache) UNUSUAL RASH, SWELLING OR PAIN  UNUSUAL VAGINAL DISCHARGE OR ITCHING   Items with * indicate a potential emergency and should be followed up as soon as possible or go to the Emergency Department if any problems should occur.  Please show the CHEMOTHERAPY ALERT CARD or IMMUNOTHERAPY ALERT CARD at check-in to the Emergency Department and triage nurse.  Should you have questions after your visit or need to cancel  or reschedule your appointment, please contact  CANCER CENTER [phone]  and follow the prompts.  Office hours are 8:00 a.m. to 4:30 p.m. Monday - Friday. Please note that voicemails left after 4:00 p.m. may not be returned until the following business day.  We are closed weekends and major holidays. You have access to a nurse at all times for urgent questions. Please call the main number to the clinic [phone] and follow the prompts.  For any non-urgent questions, you may also contact your provider using MyChart. We now offer e-Visits for anyone 18 and older to request care online for non-urgent symptoms. For details visit [email or web address].   Also download the MyChart app! Go to the app store, search "MyChart", open the app, select [facility], and log in with your MyChart username and password.  Due to Covid, a mask is required upon entering the hospital/clinic. If you do not have a mask, one will be given to you upon arrival. For doctor visits, patients may have 1 support person aged 18 or older with them. For treatment visits, patients cannot have anyone with them due to current Covid guidelines and our immunocompromised population.  

## 2022-01-01 ENCOUNTER — Ambulatory Visit: Payer: Medicare HMO | Admitting: Cardiology

## 2022-01-05 ENCOUNTER — Other Ambulatory Visit: Payer: Self-pay

## 2022-01-05 ENCOUNTER — Inpatient Hospital Stay (HOSPITAL_COMMUNITY): Payer: Medicare HMO

## 2022-01-05 VITALS — BP 146/88 | HR 85 | Temp 96.7°F | Resp 18

## 2022-01-05 DIAGNOSIS — D509 Iron deficiency anemia, unspecified: Secondary | ICD-10-CM | POA: Diagnosis not present

## 2022-01-05 DIAGNOSIS — D500 Iron deficiency anemia secondary to blood loss (chronic): Secondary | ICD-10-CM

## 2022-01-05 MED ORDER — ACETAMINOPHEN 325 MG PO TABS
650.0000 mg | ORAL_TABLET | Freq: Once | ORAL | Status: AC
  Administered 2022-01-05: 650 mg via ORAL
  Filled 2022-01-05: qty 2

## 2022-01-05 MED ORDER — SODIUM CHLORIDE 0.9 % IV SOLN: Freq: Once | INTRAVENOUS | Status: AC

## 2022-01-05 MED ORDER — LORATADINE 10 MG PO TABS
10.0000 mg | ORAL_TABLET | Freq: Once | ORAL | Status: AC
  Administered 2022-01-05: 10 mg via ORAL
  Filled 2022-01-05: qty 1

## 2022-01-05 MED ORDER — SODIUM CHLORIDE 0.9 % IV SOLN
400.0000 mg | Freq: Once | INTRAVENOUS | Status: AC
  Administered 2022-01-05: 400 mg via INTRAVENOUS
  Filled 2022-01-05: qty 20

## 2022-01-05 NOTE — Progress Notes (Signed)
Patient presents today for Venofer infusion per providers order.  Vital signs WNL.  Patient has no new complaints at this time.  Peripheral IV started and blood return noted pre infusion. 

## 2022-01-05 NOTE — Progress Notes (Signed)
Patient tolerated Venofer well with no complaints voiced.  Patient left via wheelchair in stable condition.  Vital signs stable at discharge.  Follow up as scheduled.

## 2022-01-05 NOTE — Patient Instructions (Signed)
[facility]  Discharge Instructions: Thank you for choosing [facility] to provide your oncology and hematology care.  If you have a lab appointment with the Cancer Center, please come in thru the Main Entrance and check in at the main information desk.  Wear comfortable clothing and clothing appropriate for easy access to any Portacath or PICC line.   We strive to give you quality time with your provider. You may need to reschedule your appointment if you arrive late (15 or more minutes).  Arriving late affects you and other patients whose appointments are after yours.  Also, if you miss three or more appointments without notifying the office, you may be dismissed from the clinic at the provider's discretion.      For prescription refill requests, have your pharmacy contact our office and allow 72 hours for refills to be completed.        To help prevent nausea and vomiting after your treatment, we encourage you to take your nausea medication as directed.  BELOW ARE SYMPTOMS THAT SHOULD BE REPORTED IMMEDIATELY: *FEVER GREATER THAN 100.4 F (38 C) OR HIGHER *CHILLS OR SWEATING *NAUSEA AND VOMITING THAT IS NOT CONTROLLED WITH YOUR NAUSEA MEDICATION *UNUSUAL SHORTNESS OF BREATH *UNUSUAL BRUISING OR BLEEDING *URINARY PROBLEMS (pain or burning when urinating, or frequent urination) *BOWEL PROBLEMS (unusual diarrhea, constipation, pain near the anus) TENDERNESS IN MOUTH AND THROAT WITH OR WITHOUT PRESENCE OF ULCERS (sore throat, sores in mouth, or a toothache) UNUSUAL RASH, SWELLING OR PAIN  UNUSUAL VAGINAL DISCHARGE OR ITCHING   Items with * indicate a potential emergency and should be followed up as soon as possible or go to the Emergency Department if any problems should occur.  Please show the CHEMOTHERAPY ALERT CARD or IMMUNOTHERAPY ALERT CARD at check-in to the Emergency Department and triage nurse.  Should you have questions after your visit or need to cancel  or reschedule your appointment, please contact [facility] [phone]  and follow the prompts.  Office hours are 8:00 a.m. to 4:30 p.m. Monday - Friday. Please note that voicemails left after 4:00 p.m. may not be returned until the following business day.  We are closed weekends and major holidays. You have access to a nurse at all times for urgent questions. Please call the main number to the clinic [phone] and follow the prompts.  For any non-urgent questions, you may also contact your provider using MyChart. We now offer e-Visits for anyone 18 and older to request care online for non-urgent symptoms. For details visit [email or web address].   Also download the MyChart app! Go to the app store, search "MyChart", open the app, select [place], and log in with your MyChart username and password.  Due to Covid, a mask is required upon entering the hospital/clinic. If you do not have a mask, one will be given to you upon arrival. For doctor visits, patients may have 1 support person aged 18 or older with them. For treatment visits, patients cannot have anyone with them due to current Covid guidelines and our immunocompromised population.  

## 2022-01-26 ENCOUNTER — Ambulatory Visit: Payer: Medicare HMO | Admitting: Cardiology

## 2022-02-09 ENCOUNTER — Inpatient Hospital Stay (HOSPITAL_COMMUNITY): Payer: Medicare HMO | Attending: Hematology

## 2022-02-09 DIAGNOSIS — Z862 Personal history of diseases of the blood and blood-forming organs and certain disorders involving the immune mechanism: Secondary | ICD-10-CM

## 2022-02-09 DIAGNOSIS — D509 Iron deficiency anemia, unspecified: Secondary | ICD-10-CM | POA: Diagnosis present

## 2022-02-09 LAB — CBC WITH DIFFERENTIAL/PLATELET
Abs Immature Granulocytes: 0.02 10*3/uL (ref 0.00–0.07)
Basophils Absolute: 0.1 10*3/uL (ref 0.0–0.1)
Basophils Relative: 1 %
Eosinophils Absolute: 0.2 10*3/uL (ref 0.0–0.5)
Eosinophils Relative: 2 %
HCT: 45.8 % (ref 36.0–46.0)
Hemoglobin: 14.1 g/dL (ref 12.0–15.0)
Immature Granulocytes: 0 %
Lymphocytes Relative: 16 %
Lymphs Abs: 1.2 10*3/uL (ref 0.7–4.0)
MCH: 27.3 pg (ref 26.0–34.0)
MCHC: 30.8 g/dL (ref 30.0–36.0)
MCV: 88.8 fL (ref 80.0–100.0)
Monocytes Absolute: 0.4 10*3/uL (ref 0.1–1.0)
Monocytes Relative: 5 %
Neutro Abs: 5.3 10*3/uL (ref 1.7–7.7)
Neutrophils Relative %: 76 %
Platelets: DECREASED 10*3/uL (ref 150–400)
RBC: 5.16 MIL/uL — ABNORMAL HIGH (ref 3.87–5.11)
RDW: 23.8 % — ABNORMAL HIGH (ref 11.5–15.5)
Smear Review: UNDETERMINED
WBC: 7.1 10*3/uL (ref 4.0–10.5)
nRBC: 0 % (ref 0.0–0.2)

## 2022-02-09 LAB — IRON AND TIBC
Iron: 43 ug/dL (ref 28–170)
Saturation Ratios: 9 % — ABNORMAL LOW (ref 10.4–31.8)
TIBC: 476 ug/dL — ABNORMAL HIGH (ref 250–450)
UIBC: 433 ug/dL

## 2022-02-09 LAB — FERRITIN: Ferritin: 44 ng/mL (ref 11–307)

## 2022-02-16 ENCOUNTER — Ambulatory Visit (HOSPITAL_COMMUNITY): Payer: Medicare HMO | Admitting: Hematology

## 2022-02-25 ENCOUNTER — Ambulatory Visit (HOSPITAL_COMMUNITY): Payer: Medicare HMO | Admitting: Physician Assistant

## 2022-03-05 ENCOUNTER — Encounter (HOSPITAL_COMMUNITY): Payer: Self-pay | Admitting: Hematology

## 2022-03-11 NOTE — Progress Notes (Signed)
? ?[facility] ?[address] ?[place], [place] [address] ? ? ?CLINIC:  ?Medical Oncology/Hematology ? ?PCP:  ?Ignatius Specking, MD ?[address] ?[place] [address] ?[phone] ? ? ?REASON FOR VISIT:  ?Follow-up for iron deficiency anemia ? ?CURRENT THERAPY: Intermittent IV iron ? ?INTERVAL HISTORY:  ?Ms. Parkerson 81 y.o. female returns for routine follow-up of her iron deficiency anemia.  She was last seen by Dr. Ellin Saba on 12/15/2021. ? ?At today's visit, she reports feeling poorly due to fatigue.  No recent hospitalizations, surgeries, or changes in baseline health status. ? ?She reports that she felt only very minimally improved after her IV iron in January and February.  She remains extremely fatigued, but notes that she is not having as much ice pica.  She continues to have symptoms of dizziness, restless legs, headaches, palpitations, and dyspnea on exertion.  She denies any chest pain, lightheadedness, or syncope.  She denies any obvious hematochezia or melena. ? ?She has 10% energy and 100% appetite. She endorses that she is maintaining a stable weight. ? ? ?REVIEW OF SYSTEMS:  ?Review of Systems  ?Constitutional:  Positive for fatigue. Negative for appetite change, chills, diaphoresis, fever and unexpected weight change.  ?HENT:   Negative for lump/mass and nosebleeds.   ?Eyes:  Negative for eye problems.  ?Respiratory:  Positive for shortness of breath (With exertion). Negative for cough and hemoptysis.   ?Cardiovascular:  Positive for palpitations. Negative for chest pain and leg swelling.  ?Gastrointestinal:  Positive for diarrhea. Negative for abdominal pain, blood in stool, constipation, nausea and vomiting.  ?Genitourinary:  Negative for hematuria.   ?Skin: Negative.   ?Neurological:  Positive for dizziness and numbness. Negative for headaches and light-headedness.  ?Hematological:  Does not bruise/bleed easily.   ? ? ?PAST MEDICAL/SURGICAL HISTORY:  ?Past Medical History:  ?Diagnosis Date  ?  Anemia   ? secondary to acute on chronic GI bleed  ? Anxiety disorder   ? Atrial fibrillation (HCC)   ? Diverticulosis   ? Exertional dyspnea   ? Normal LVF, 2-D echo, 09/2012  ? Hiatal hernia   ? Hypercholesterolemia   ? Hypertension   ? 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 Obesity   ? Osteoarthritis   ? Sleep apnea 07/08/2014  ? Tobacco abuse   ? ?Past Surgical History:  ?Procedure Laterality Date  ? ABDOMINAL HYSTERECTOMY    ? GIVENS CAPSULE STUDY N/A 02/19/2013  ? Procedure: GIVENS CAPSULE STUDY;  Surgeon: Malissa Hippo, MD;  Location: [facility];  Service: Endoscopy;  Laterality: N/A;  730  ? ? ? ?SOCIAL HISTORY:  ?Social History  ? ?Socioeconomic History  ? Marital status: Widowed  ?  Spouse name: Not on file  ? Number of children: Not on file  ? Years of education: Not on file  ? Highest education level: Not on file  ?Occupational History  ? Not on file  ?Tobacco Use  ? Smoking status: Former  ?  Packs/day: 0.50  ?  Years: 20.00  ?  Pack years: 10.00  ?  Types: Cigarettes  ?  Quit date: 06/22/2012  ?  Years since quitting: 9.7  ? Smokeless tobacco: Never  ?Vaping Use  ? Vaping Use: Never used  ?Substance and Sexual Activity  ? Alcohol use: No  ? Drug use: No  ? Sexual activity: Not on file  ?Other Topics Concern  ? Not on file  ?Social History Narrative  ? Not on file  ? ?Social  Determinants of Health  ? ?Financial Resource Strain: Not on file  ?Food Insecurity: Not on file  ?Transportation Needs: Not on file  ?Physical Activity: Not on file  ?Stress: Not on file  ?Social Connections: Not on file  ?Intimate Partner Violence: Not on file  ? ? ?FAMILY HISTORY:  ?Family History  ?Problem Relation Age of Onset  ? Hypertension Brother   ? ? ?CURRENT MEDICATIONS:  ?Outpatient Encounter Medications as of 03/12/2022  ?Medication Sig  ? acetaminophen (TYLENOL) 500 MG tablet Take 1,000 mg by mouth every 6 (six) hours as needed for mild pain.  ? BREO ELLIPTA 100-25 MCG/INH AEPB Inhale 1 puff into the lungs daily.    ? clonazePAM (KLONOPIN) 0.5 MG tablet Take 0.5 mg by mouth daily as needed for anxiety.  ? diltiazem (CARDIZEM CD) 360 MG 24 hr capsule Take 1 capsule (360 mg total) by mouth daily.  ? ipratropium-albuterol (DUONEB) 0.5-2.5 (3) MG/3ML SOLN Take 3 mLs by nebulization every 4 (four) hours as needed.  ? lisinopril (ZESTRIL) 20 MG tablet Take 1 tablet (20 mg total) by mouth daily.  ? metFORMIN (GLUCOPHAGE) 850 MG tablet Take 850 mg by mouth daily as needed.  ? metoprolol tartrate (LOPRESSOR) 25 MG tablet Take 1 tablet (25 mg total) by mouth 2 (two) times daily.  ? OXYGEN Inhale 3 L into the lungs at bedtime.  ? tetrahydrozoline-zinc (VISINE-AC) 0.05-0.25 % ophthalmic solution Place 2 drops into both eyes 3 (three) times daily as needed ([place] Eyes).  ? ?No facility-administered encounter medications on file as of 03/12/2022.  ? ? ?ALLERGIES:  ?Allergies  ?Allergen Reactions  ? Sulfa Antibiotics Itching and Other (See Comments)  ?  Burning  ? Shellfish Allergy Hives, Itching and Swelling  ? ? ? ?PHYSICAL EXAM:  ?ECOG PERFORMANCE STATUS: 2 - Symptomatic, <50% confined to bed ? ?There were no vitals filed for this visit. ?There were no vitals filed for this visit. ?Physical Exam ?Constitutional:   ?   Appearance: Normal appearance. She is obese.  ?HENT:  ?   Head: Normocephalic and atraumatic.  ?   Mouth/Throat:  ?   Mouth: Mucous membranes are moist.  ?Eyes:  ?   Extraocular Movements: Extraocular movements intact.  ?   Pupils: Pupils are equal, round, and reactive to light.  ?Cardiovascular:  ?   Rate and Rhythm: Normal rate and regular rhythm.  ?   Pulses: Normal pulses.  ?   Heart sounds: Normal heart sounds.  ?Pulmonary:  ?   Effort: Pulmonary effort is normal.  ?   Breath sounds: Normal breath sounds.  ?Abdominal:  ?   General: Bowel sounds are normal.  ?   Palpations: Abdomen is soft.  ?   Tenderness: There is no abdominal tenderness.  ?Musculoskeletal:     ?   General: No swelling.  ?   Right lower leg: No  edema.  ?   Left lower leg: No edema.  ?Lymphadenopathy:  ?   Cervical: No cervical adenopathy.  ?Skin: ?   General: Skin is warm and dry.  ?Neurological:  ?   General: No focal deficit present.  ?   Mental Status: She is alert and oriented to person, place, and time.  ?Psychiatric:     ?   Mood and Affect: Mood normal.     ?   Behavior: Behavior normal.  ? ? ? ?LABORATORY DATA:  ?I have reviewed the labs as listed.  ?CBC ?   ?Component Value Date/Time  ?  WBC 7.1 02/09/2022 1108  ? RBC 5.16 (H) 02/09/2022 1108  ? HGB 14.1 02/09/2022 1108  ? HCT 45.8 02/09/2022 1108  ? PLT PLATELETS APPEAR DECREASED 02/09/2022 1108  ? MCV 88.8 02/09/2022 1108  ? MCH 27.3 02/09/2022 1108  ? MCHC 30.8 02/09/2022 1108  ? RDW 23.8 (H) 02/09/2022 1108  ? LYMPHSABS 1.2 02/09/2022 1108  ? MONOABS 0.4 02/09/2022 1108  ? EOSABS 0.2 02/09/2022 1108  ? BASOSABS 0.1 02/09/2022 1108  ? ? ?  Latest Ref Rng & Units 12/15/2021  ? 11:02 AM 09/23/2021  ?  1:08 PM 10/24/2020  ?  4:48 AM  ?CMP  ?Glucose 70 - 99 mg/dL 130   224   127    ?BUN 8 - 23 mg/dL 15   14   30     ?Creatinine 0.44 - 1.00 mg/dL 0.82   0.97   0.96    ?Sodium 135 - 145 mmol/L 137   138   138    ?Potassium 3.5 - 5.1 mmol/L 3.6   4.2   3.7    ?Chloride 98 - 111 mmol/L 103   103   101    ?CO2 22 - 32 mmol/L 23   25   25     ?Calcium 8.9 - 10.3 mg/dL 9.0   9.2   9.4    ?Total Protein 6.5 - 8.1 g/dL 7.7      ?Total Bilirubin 0.3 - 1.2 mg/dL 0.7      ?Alkaline Phos 38 - 126 U/L 134      ?AST 15 - 41 U/L 16      ?ALT 0 - 44 U/L 11      ? ? ?DIAGNOSTIC IMAGING:  ?I have independently reviewed the relevant imaging and discussed with the patient. ? ?ASSESSMENT & PLAN: ?1.  Iron deficiency anemia ?- Patient seen at the request of Dr. Woody Seller ?- Labs on 12/01/2021 with hemoglobin 8.8, MCV 72 and low MCH, MCHC and high RDW.  WBC and PLT are normal Iron studies show percent saturation 3, TIBC 446, ferritin 22. ?- She has a history of receiving parenteral iron therapy, last time in 2017. ?- Colonoscopy 2  years ago in [place] reportedly normal other than 2 polyps removed.  History of 2 units of blood transfusion. ?- She was symptomatic with ice pica and severe fatigue. ?- Received IV iron with Venofer

## 2022-03-12 ENCOUNTER — Encounter (HOSPITAL_COMMUNITY): Payer: Self-pay | Admitting: Hematology

## 2022-03-12 ENCOUNTER — Inpatient Hospital Stay (HOSPITAL_COMMUNITY): Payer: 59 | Attending: Hematology | Admitting: Physician Assistant

## 2022-03-12 VITALS — BP 144/90 | HR 62 | Temp 98.5°F | Resp 18 | Ht 63.0 in | Wt 215.2 lb

## 2022-03-12 DIAGNOSIS — Z803 Family history of malignant neoplasm of breast: Secondary | ICD-10-CM | POA: Diagnosis not present

## 2022-03-12 DIAGNOSIS — R5383 Other fatigue: Secondary | ICD-10-CM | POA: Insufficient documentation

## 2022-03-12 DIAGNOSIS — D500 Iron deficiency anemia secondary to blood loss (chronic): Secondary | ICD-10-CM

## 2022-03-12 DIAGNOSIS — Z87891 Personal history of nicotine dependence: Secondary | ICD-10-CM | POA: Insufficient documentation

## 2022-03-12 DIAGNOSIS — D509 Iron deficiency anemia, unspecified: Secondary | ICD-10-CM | POA: Insufficient documentation

## 2022-03-12 NOTE — Patient Instructions (Signed)
[facility] at [facility] ?Discharge Instructions ? ?You were seen today by Rojelio Brenner PA-C for your iron deficiency anemia. ? ?Your blood levels look much better after the iron that we gave you in January.  However, your iron levels are still slightly low, since the iron that we gave you was used to make new blood cells.  Your low iron may be part of why you are still feeling fatigued. ? ?We will schedule you for IV iron every week for 3 weeks. ? ? ?FOLLOW-UP APPOINTMENT: Repeat labs and office visit in 3 months ? ? ?Thank you for choosing [facility] to provide your oncology and hematology care.  To afford each patient quality time with our provider, please arrive at least 15 minutes before your scheduled appointment time.  ? ?If you have a lab appointment with the Cancer Center please come in thru the Main Entrance and check in at the main information desk. ? ?You need to re-schedule your appointment should you arrive 10 or more minutes late.  We strive to give you quality time with our providers, and arriving late affects you and other patients whose appointments are after yours.  Also, if you no show three or more times for appointments you may be dismissed from the clinic at the providers discretion.     ?Again, thank you for choosing [facility].  Our hope is that these requests will decrease the amount of time that you wait before being seen by our physicians.       ?_____________________________________________________________ ? ?Should you have questions after your visit to [facility], please contact our office at [phone] and follow the prompts.  Our office hours are 8:00 a.m. and 4:30 p.m. Monday - Friday.  Please note that voicemails left after 4:00 p.m. may not be returned until the following business day.  We are closed weekends and major holidays.  You do have access to a nurse 24-7, just call the  main number to the clinic [phone] and do not press any options, hold on the line and a nurse will answer the phone.   ? ?For prescription refill requests, have your pharmacy contact our office and allow 72 hours.   ? ?Due to Covid, you will need to wear a mask upon entering the hospital. If you do not have a mask, a mask will be given to you at the Main Entrance upon arrival. For doctor visits, patients may have 1 support person age 90 or older with them. For treatment visits, patients can not have anyone with them due to social distancing guidelines and our immunocompromised population.  ? ? ? ?

## 2022-03-30 ENCOUNTER — Encounter: Payer: Self-pay | Admitting: Cardiology

## 2022-03-30 ENCOUNTER — Ambulatory Visit (INDEPENDENT_AMBULATORY_CARE_PROVIDER_SITE_OTHER): Payer: 59 | Admitting: Cardiology

## 2022-03-30 VITALS — BP 138/78 | HR 86 | Ht 62.0 in | Wt 217.0 lb

## 2022-03-30 DIAGNOSIS — I4821 Permanent atrial fibrillation: Secondary | ICD-10-CM

## 2022-03-30 DIAGNOSIS — I10 Essential (primary) hypertension: Secondary | ICD-10-CM

## 2022-03-30 MED ORDER — METOPROLOL TARTRATE 25 MG PO TABS: 37.5000 mg | ORAL_TABLET | Freq: Two times a day (BID) | ORAL | 2 refills | Status: DC

## 2022-03-30 NOTE — Patient Instructions (Addendum)
Medication Instructions:  ?Your physician has recommended you make the following change in your medication:  ?Increase metoprolol tartrate to 37.5 mg twice daily (take 1.5 of your 25 mg tablet twice daily) ?Continue other medications the same ? ?Labwork: ?none ? ?Testing/Procedures: ?none ? ?Follow-Up: ?Your physician recommends that you schedule a follow-up appointment in: 6 months ? ?Any Other Special Instructions Will Be Listed Below (If Applicable). ? ?If you need a refill on your cardiac medications before your next appointment, please call your pharmacy. ?

## 2022-03-30 NOTE — Progress Notes (Signed)
? ? ? ?Clinical Summary ?[name] is a 81 y.o.female former patient of Dr Purvis Sheffield, this is our first visit together. Seen for the following medical problems ? ? ?1.Permanent afib ? ?-not on anticoag due to history of GI bleeding, not candidate for watchman as cannot tolerate short duration of anticoag ?- daily palpitations, lasts a few minutes. ?- compliant with diltiazem and lopressor ? ? ?2. HTN ?- compliant with meds ? ? 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 ?4. OSA ?- not using cpap machine ? ?5. Chronic iron deficient anemia ?- followed by hematology ?- has been on IV iron.  ? ?Past Medical History:  ?Diagnosis Date  ? Anemia   ? secondary to acute on chronic GI bleed  ? Anxiety disorder   ? Atrial fibrillation (HCC)   ? Diverticulosis   ? Exertional dyspnea   ? Normal LVF, 2-D echo, 09/2012  ? Hiatal hernia   ? Hypercholesterolemia   ? Hypertension   ? 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 Obesity   ? Osteoarthritis   ? Sleep apnea 07/08/2014  ? Tobacco abuse   ? ? ? ?Allergies  ?Allergen Reactions  ? Sulfa Antibiotics Itching and Other (See Comments)  ?  Burning  ? Shellfish Allergy Hives, Itching and Swelling  ? ? ? ?Current Outpatient Medications  ?Medication Sig Dispense Refill  ? acetaminophen (TYLENOL) 500 MG tablet Take 1,000 mg by mouth every 6 (six) hours as needed for mild pain.    ? BREO ELLIPTA 100-25 MCG/INH AEPB Inhale 1 puff into the lungs daily.     ? clonazePAM (KLONOPIN) 0.5 MG tablet Take 0.5 mg by mouth daily as needed for anxiety.    ? diltiazem (CARDIZEM CD) 360 MG 24 hr capsule Take 1 capsule (360 mg total) by mouth daily. 90 capsule 3  ? ipratropium-albuterol (DUONEB) 0.5-2.5 (3) MG/3ML SOLN Take 3 mLs by nebulization every 4 (four) hours as needed. 360 mL   ? lisinopril (ZESTRIL) 20 MG tablet Take 1 tablet (20 mg total) by mouth daily. 90 tablet 2  ? metFORMIN (GLUCOPHAGE) 850 MG tablet Take 850 mg by mouth daily as needed.    ? metoprolol tartrate (LOPRESSOR) 25 MG tablet Take  1 tablet (25 mg total) by mouth 2 (two) times daily. 180 tablet 2  ? OXYGEN Inhale 3 L into the lungs at bedtime.    ? tetrahydrozoline-zinc (VISINE-AC) 0.05-0.25 % ophthalmic solution Place 2 drops into both eyes 3 (three) times daily as needed ([place]).    ? ?No current facility-administered medications for this visit.  ? ? ? ?Past Surgical History:  ?Procedure Laterality Date  ? ABDOMINAL HYSTERECTOMY    ? GIVENS CAPSULE STUDY N/A 02/19/2013  ? Procedure: GIVENS CAPSULE STUDY;  Surgeon: Andrea Hippo, MD;  Location: [facility];  Service: Endoscopy;  Laterality: N/A;  730  ? ? ? ?Allergies  ?Allergen Reactions  ? Sulfa Antibiotics Itching and Other (See Comments)  ?  Burning  ? Shellfish Allergy Hives, Itching and Swelling  ? ? ? ? ?Family History  ?Problem Relation Age of Onset  ? Hypertension Brother   ? ? ? ?Social History ?[name] reports that she quit smoking about 9 years ago. Her smoking use included cigarettes. She has a 10.00 pack-year smoking history. She has never used smokeless tobacco. ?[name] reports no history of alcohol use. ? ? ?Review of Systems ?CONSTITUTIONAL: No weight loss, fever, chills, weakness or fatigue.  ?HEENT: Eyes: No  visual loss, blurred vision, double vision or yellow sclerae.No hearing loss, sneezing, congestion, runny nose or sore throat.  ?SKIN: No rash or itching.  ?CARDIOVASCULAR: per hpi ?RESPIRATORY: No shortness of breath, cough or sputum.  ?GASTROINTESTINAL: No anorexia, nausea, vomiting or diarrhea. No abdominal pain or blood.  ?GENITOURINARY: No burning on urination, no polyuria ?NEUROLOGICAL: No headache, dizziness, syncope, paralysis, ataxia, numbness or tingling in the extremities. No change in bowel or bladder control.  ?MUSCULOSKELETAL: No muscle, back pain, joint pain or stiffness.  ?LYMPHATICS: No enlarged nodes. No history of splenectomy.  ?PSYCHIATRIC: No history of depression or anxiety.  ?ENDOCRINOLOGIC: No reports of sweating, cold or heat  intolerance. No polyuria or polydipsia.  ?. ? ? ?Physical Examination ?Today's Vitals  ? 03/30/22 1013  ?BP: 138/78  ?Pulse: 86  ?SpO2: 96%  ?Weight: 217 lb (98.4 kg)  ?Height: 5\' 2"  (1.575 m)  ? ?Body mass index is 39.69 kg/m?. ? ?Gen: resting comfortably, no acute distress ?HEENT: no scleral icterus, pupils equal round and reactive, no palptable cervical adenopathy,  ?CV: irreg, no m/rg no jvd ?Resp: Clear to auscultation bilaterally ?GI: abdomen is soft, non-tender, non-distended, normal bowel sounds, no hepatosplenomegaly ?MSK: extremities are warm, no edema.  ?Skin: warm, no rash ?Neuro:  no focal deficits ?Psych: appropriate affect ? ? ?Diagnostic Studies ? ?07/2021 echo  ?IMPRESSIONS  ? ? ? 1. Left ventricular ejection fraction, by estimation, is 55 to 60%. The  ?left ventricle has normal function. The left ventricle has no regional  ?wall motion abnormalities. There is moderate left ventricular hypertrophy.  ?Left ventricular diastolic  ?parameters are indeterminate. Elevated left atrial pressure.  ? 2. Right ventricular systolic function is normal. The right ventricular  ?size is normal. There is mildly elevated pulmonary artery systolic  ?pressure.  ? 3. Left atrial size was moderately dilated.  ? 4. A small pericardial effusion is present. The pericardial effusion is  ?posterior to the left ventricle.  ? 5. The mitral valve is grossly normal. Mild mitral valve regurgitation.  ? 6. The aortic valve is tricuspid. There is mild calcification of the  ?aortic valve. Aortic valve regurgitation is mild. No aortic stenosis is  ?present.  ? 7. The inferior vena cava is normal in size with greater than 50%  ?respiratory variability, suggesting right atrial pressure of 3 mmHg.  ? ? ?Assessment and Plan  ?1.Permanent afib ?- recent palpitations, will continue dilt and increase lopressor to 37.5mg  bid ?- not able to tolerate anticoag due to GI bleed ? ?2. HTN ?- above goal, monitor with adjustment in lopressor today.   ? ? ?F/u 6 months ? ? ?08/2021, M.D. ?

## 2022-04-15 ENCOUNTER — Other Ambulatory Visit (HOSPITAL_COMMUNITY): Payer: Self-pay | Admitting: Physician Assistant

## 2022-04-15 ENCOUNTER — Inpatient Hospital Stay (HOSPITAL_COMMUNITY): Payer: 59 | Attending: Hematology

## 2022-04-15 VITALS — BP 128/85 | HR 96 | Temp 98.0°F | Resp 18

## 2022-04-15 DIAGNOSIS — Z87891 Personal history of nicotine dependence: Secondary | ICD-10-CM | POA: Insufficient documentation

## 2022-04-15 DIAGNOSIS — D509 Iron deficiency anemia, unspecified: Secondary | ICD-10-CM | POA: Insufficient documentation

## 2022-04-15 DIAGNOSIS — R5383 Other fatigue: Secondary | ICD-10-CM | POA: Diagnosis not present

## 2022-04-15 DIAGNOSIS — D500 Iron deficiency anemia secondary to blood loss (chronic): Secondary | ICD-10-CM

## 2022-04-15 DIAGNOSIS — Z803 Family history of malignant neoplasm of breast: Secondary | ICD-10-CM | POA: Diagnosis not present

## 2022-04-15 MED ORDER — SODIUM CHLORIDE 0.9 % IV SOLN
300.0000 mg | Freq: Once | INTRAVENOUS | Status: AC
  Administered 2022-04-15: 300 mg via INTRAVENOUS
  Filled 2022-04-15: qty 300

## 2022-04-15 MED ORDER — ACETAMINOPHEN 325 MG PO TABS
650.0000 mg | ORAL_TABLET | Freq: Once | ORAL | Status: AC
  Administered 2022-04-15: 650 mg via ORAL
  Filled 2022-04-15: qty 2

## 2022-04-15 MED ORDER — SODIUM CHLORIDE 0.9 % IV SOLN: Freq: Once | INTRAVENOUS | Status: AC

## 2022-04-15 MED ORDER — LORATADINE 10 MG PO TABS
10.0000 mg | ORAL_TABLET | Freq: Once | ORAL | Status: AC
  Administered 2022-04-15: 10 mg via ORAL
  Filled 2022-04-15: qty 1

## 2022-04-15 NOTE — Progress Notes (Signed)
IV Pepcid added to iron premedications due to mild headache experienced during most recent Venofer infusion.

## 2022-04-15 NOTE — Progress Notes (Signed)
Patient presents today for Venofer infusion per providers order.  Vital signs WNL.  Patients only complaint is that she is sore from a fall last Thursday.  Peripheral IV started and blood return noted per and post infusion.  Patient had approximately 15 minutes left on her infusion and was complaining of a headache and slight dizziness,  infusion stopped and saline running.   Rojelio Brenner PA notified.  Instructed to hold the rest of the infusion and see if patient is feeling better.  Vital signs WNL.  After 30 minutes patient feeling better and discharged per PA's instruction.    Per PA additional Premedications will be added to the next infusion.    Vital signs stable.  No complaints at this time.  Discharge from clinic via wheelchair in stable condition.  Alert and oriented X 3.  Follow up with [facility] as scheduled.

## 2022-04-15 NOTE — Patient Instructions (Signed)
[facility]  Discharge Instructions: Thank you for choosing [facility] to provide your oncology and hematology care.  If you have a lab appointment with the Cancer Center, please come in thru the Main Entrance and check in at the main information desk.  Wear comfortable clothing and clothing appropriate for easy access to any Portacath or PICC line.   We strive to give you quality time with your provider. You may need to reschedule your appointment if you arrive late (15 or more minutes).  Arriving late affects you and other patients whose appointments are after yours.  Also, if you miss three or more appointments without notifying the office, you may be dismissed from the clinic at the provider's discretion.      For prescription refill requests, have your pharmacy contact our office and allow 72 hours for refills to be completed.    Today you received the following chemotherapy and/or immunotherapy agents Venofer      To help prevent nausea and vomiting after your treatment, we encourage you to take your nausea medication as directed.  BELOW ARE SYMPTOMS THAT SHOULD BE REPORTED IMMEDIATELY: *FEVER GREATER THAN 100.4 F (38 C) OR HIGHER *CHILLS OR SWEATING *NAUSEA AND VOMITING THAT IS NOT CONTROLLED WITH YOUR NAUSEA MEDICATION *UNUSUAL SHORTNESS OF BREATH *UNUSUAL BRUISING OR BLEEDING *URINARY PROBLEMS (pain or burning when urinating, or frequent urination) *BOWEL PROBLEMS (unusual diarrhea, constipation, pain near the anus) TENDERNESS IN MOUTH AND THROAT WITH OR WITHOUT PRESENCE OF ULCERS (sore throat, sores in mouth, or a toothache) UNUSUAL RASH, SWELLING OR PAIN  UNUSUAL VAGINAL DISCHARGE OR ITCHING   Items with * indicate a potential emergency and should be followed up as soon as possible or go to the Emergency Department if any problems should occur.  Please show the CHEMOTHERAPY ALERT CARD or IMMUNOTHERAPY ALERT CARD at check-in to the Emergency  Department and triage nurse.  Should you have questions after your visit or need to cancel or reschedule your appointment, please contact [facility] [phone]  and follow the prompts.  Office hours are 8:00 a.m. to 4:30 p.m. Monday - Friday. Please note that voicemails left after 4:00 p.m. may not be returned until the following business day.  We are closed weekends and major holidays. You have access to a nurse at all times for urgent questions. Please call the main number to the clinic [phone] and follow the prompts.  For any non-urgent questions, you may also contact your provider using MyChart. We now offer e-Visits for anyone 18 and older to request care online for non-urgent symptoms. For details visit [email or web address].   Also download the MyChart app! Go to the app store, search "MyChart", open the app, select [facility], and log in with your MyChart username and password.  Due to Covid, a mask is required upon entering the hospital/clinic. If you do not have a mask, one will be given to you upon arrival. For doctor visits, patients may have 1 support person aged 18 or older with them. For treatment visits, patients cannot have anyone with them due to current Covid guidelines and our immunocompromised population.  

## 2022-04-20 ENCOUNTER — Inpatient Hospital Stay (HOSPITAL_COMMUNITY): Payer: 59

## 2022-04-20 VITALS — BP 164/85 | HR 80 | Temp 97.5°F | Resp 20

## 2022-04-20 DIAGNOSIS — D500 Iron deficiency anemia secondary to blood loss (chronic): Secondary | ICD-10-CM

## 2022-04-20 DIAGNOSIS — D509 Iron deficiency anemia, unspecified: Secondary | ICD-10-CM | POA: Diagnosis not present

## 2022-04-20 MED ORDER — SODIUM CHLORIDE 0.9 % IV SOLN: Freq: Once | INTRAVENOUS | Status: AC

## 2022-04-20 MED ORDER — FAMOTIDINE IN NACL 20-0.9 MG/50ML-% IV SOLN
20.0000 mg | Freq: Once | INTRAVENOUS | Status: AC
  Administered 2022-04-20: 20 mg via INTRAVENOUS
  Filled 2022-04-20: qty 50

## 2022-04-20 MED ORDER — SODIUM CHLORIDE 0.9 % IV SOLN
300.0000 mg | Freq: Once | INTRAVENOUS | Status: AC
  Administered 2022-04-20: 300 mg via INTRAVENOUS
  Filled 2022-04-20: qty 300

## 2022-04-20 MED ORDER — LORATADINE 10 MG PO TABS
10.0000 mg | ORAL_TABLET | Freq: Once | ORAL | Status: AC
  Administered 2022-04-20: 10 mg via ORAL
  Filled 2022-04-20: qty 1

## 2022-04-20 MED ORDER — ACETAMINOPHEN 325 MG PO TABS
650.0000 mg | ORAL_TABLET | Freq: Once | ORAL | Status: AC
  Administered 2022-04-20: 650 mg via ORAL
  Filled 2022-04-20: qty 2

## 2022-04-20 NOTE — Progress Notes (Signed)
Pt presents today for Venofer IV iron infusion per provider's order. Vital signs stable and pt voiced no new complaints at this time.  ? ?Peripheral IV started with good blood return pre and post infusion. ? ?Venofer 300 mg given today per MD orders. Tolerated infusion without adverse affects. Vital signs stable. No complaints at this time. Discharged from clinic via wheelchair in stable condition. Alert and oriented x 3. F/U with [facility] as scheduled.   ?

## 2022-04-20 NOTE — Patient Instructions (Signed)
[facility]  Discharge Instructions: Thank you for choosing [facility] to provide your oncology and hematology care.  If you have a lab appointment with the Cancer Center, please come in thru the Main Entrance and check in at the main information desk.  Wear comfortable clothing and clothing appropriate for easy access to any Portacath or PICC line.   We strive to give you quality time with your provider. You may need to reschedule your appointment if you arrive late (15 or more minutes).  Arriving late affects you and other patients whose appointments are after yours.  Also, if you miss three or more appointments without notifying the office, you may be dismissed from the clinic at the provider's discretion.      For prescription refill requests, have your pharmacy contact our office and allow 72 hours for refills to be completed.    Today you received Venofer IV iron infusion.     BELOW ARE SYMPTOMS THAT SHOULD BE REPORTED IMMEDIATELY: *FEVER GREATER THAN 100.4 F (38 C) OR HIGHER *CHILLS OR SWEATING *NAUSEA AND VOMITING THAT IS NOT CONTROLLED WITH YOUR NAUSEA MEDICATION *UNUSUAL SHORTNESS OF BREATH *UNUSUAL BRUISING OR BLEEDING *URINARY PROBLEMS (pain or burning when urinating, or frequent urination) *BOWEL PROBLEMS (unusual diarrhea, constipation, pain near the anus) TENDERNESS IN MOUTH AND THROAT WITH OR WITHOUT PRESENCE OF ULCERS (sore throat, sores in mouth, or a toothache) UNUSUAL RASH, SWELLING OR PAIN  UNUSUAL VAGINAL DISCHARGE OR ITCHING   Items with * indicate a potential emergency and should be followed up as soon as possible or go to the Emergency Department if any problems should occur.  Please show the CHEMOTHERAPY ALERT CARD or IMMUNOTHERAPY ALERT CARD at check-in to the Emergency Department and triage nurse.  Should you have questions after your visit or need to cancel or reschedule your appointment, please contact [facility]  [phone]  and follow the prompts.  Office hours are 8:00 a.m. to 4:30 p.m. Monday - Friday. Please note that voicemails left after 4:00 p.m. may not be returned until the following business day.  We are closed weekends and major holidays. You have access to a nurse at all times for urgent questions. Please call the main number to the clinic [phone] and follow the prompts.  For any non-urgent questions, you may also contact your provider using MyChart. We now offer e-Visits for anyone 18 and older to request care online for non-urgent symptoms. For details visit [email or web address].   Also download the MyChart app! Go to the app store, search "MyChart", open the app, select [facility], and log in with your MyChart username and password.  Due to Covid, a mask is required upon entering the hospital/clinic. If you do not have a mask, one will be given to you upon arrival. For doctor visits, patients may have 1 support person aged 18 or older with them. For treatment visits, patients cannot have anyone with them due to current Covid guidelines and our immunocompromised population.  

## 2022-04-27 ENCOUNTER — Inpatient Hospital Stay (HOSPITAL_COMMUNITY): Payer: 59 | Attending: Hematology

## 2022-04-27 VITALS — BP 154/82 | HR 64 | Temp 98.1°F | Resp 18

## 2022-04-27 DIAGNOSIS — Z87891 Personal history of nicotine dependence: Secondary | ICD-10-CM | POA: Diagnosis not present

## 2022-04-27 DIAGNOSIS — Z803 Family history of malignant neoplasm of breast: Secondary | ICD-10-CM | POA: Insufficient documentation

## 2022-04-27 DIAGNOSIS — R5383 Other fatigue: Secondary | ICD-10-CM | POA: Insufficient documentation

## 2022-04-27 DIAGNOSIS — D500 Iron deficiency anemia secondary to blood loss (chronic): Secondary | ICD-10-CM

## 2022-04-27 DIAGNOSIS — D509 Iron deficiency anemia, unspecified: Secondary | ICD-10-CM | POA: Insufficient documentation

## 2022-04-27 MED ORDER — ACETAMINOPHEN 325 MG PO TABS
650.0000 mg | ORAL_TABLET | Freq: Once | ORAL | Status: AC
  Administered 2022-04-27: 650 mg via ORAL
  Filled 2022-04-27: qty 2

## 2022-04-27 MED ORDER — FAMOTIDINE IN NACL 20-0.9 MG/50ML-% IV SOLN
20.0000 mg | Freq: Once | INTRAVENOUS | Status: AC
  Administered 2022-04-27: 20 mg via INTRAVENOUS
  Filled 2022-04-27: qty 50

## 2022-04-27 MED ORDER — SODIUM CHLORIDE 0.9 % IV SOLN: Freq: Once | INTRAVENOUS | Status: AC

## 2022-04-27 MED ORDER — SODIUM CHLORIDE 0.9 % IV SOLN
300.0000 mg | Freq: Once | INTRAVENOUS | Status: AC
  Administered 2022-04-27: 300 mg via INTRAVENOUS
  Filled 2022-04-27: qty 300

## 2022-04-27 MED ORDER — LORATADINE 10 MG PO TABS
10.0000 mg | ORAL_TABLET | Freq: Once | ORAL | Status: AC
  Administered 2022-04-27: 10 mg via ORAL
  Filled 2022-04-27: qty 1

## 2022-04-27 NOTE — Patient Instructions (Signed)
[facility]  Discharge Instructions: Thank you for choosing [facility] to provide your oncology and hematology care.  If you have a lab appointment with the Cancer Center, please come in thru the Main Entrance and check in at the main information desk.  Wear comfortable clothing and clothing appropriate for easy access to any Portacath or PICC line.   We strive to give you quality time with your provider. You may need to reschedule your appointment if you arrive late (15 or more minutes).  Arriving late affects you and other patients whose appointments are after yours.  Also, if you miss three or more appointments without notifying the office, you may be dismissed from the clinic at the provider's discretion.      For prescription refill requests, have your pharmacy contact our office and allow 72 hours for refills to be completed.    Today you received the following: Venofer, return as scheduled.   To help prevent nausea and vomiting after your treatment, we encourage you to take your nausea medication as directed.  BELOW ARE SYMPTOMS THAT SHOULD BE REPORTED IMMEDIATELY: *FEVER GREATER THAN 100.4 F (38 C) OR HIGHER *CHILLS OR SWEATING *NAUSEA AND VOMITING THAT IS NOT CONTROLLED WITH YOUR NAUSEA MEDICATION *UNUSUAL SHORTNESS OF BREATH *UNUSUAL BRUISING OR BLEEDING *URINARY PROBLEMS (pain or burning when urinating, or frequent urination) *BOWEL PROBLEMS (unusual diarrhea, constipation, pain near the anus) TENDERNESS IN MOUTH AND THROAT WITH OR WITHOUT PRESENCE OF ULCERS (sore throat, sores in mouth, or a toothache) UNUSUAL RASH, SWELLING OR PAIN  UNUSUAL VAGINAL DISCHARGE OR ITCHING   Items with * indicate a potential emergency and should be followed up as soon as possible or go to the Emergency Department if any problems should occur.  Please show the CHEMOTHERAPY ALERT CARD or IMMUNOTHERAPY ALERT CARD at check-in to the Emergency Department and triage  nurse.  Should you have questions after your visit or need to cancel or reschedule your appointment, please contact [facility] [phone]  and follow the prompts.  Office hours are 8:00 a.m. to 4:30 p.m. Monday - Friday. Please note that voicemails left after 4:00 p.m. may not be returned until the following business day.  We are closed weekends and major holidays. You have access to a nurse at all times for urgent questions. Please call the main number to the clinic [phone] and follow the prompts.  For any non-urgent questions, you may also contact your provider using MyChart. We now offer e-Visits for anyone 18 and older to request care online for non-urgent symptoms. For details visit [email or web address].   Also download the MyChart app! Go to the app store, search "MyChart", open the app, select [facility], and log in with your MyChart username and password.  Due to Covid, a mask is required upon entering the hospital/clinic. If you do not have a mask, one will be given to you upon arrival. For doctor visits, patients may have 1 support person aged 18 or older with them. For treatment visits, patients cannot have anyone with them due to current Covid guidelines and our immunocompromised population.  

## 2022-04-27 NOTE — Progress Notes (Signed)
Patient tolerated iron infusion with no complaints voiced.  Peripheral IV site clean and dry with good blood return noted before and after infusion.  Band aid applied.  VSS with discharge and left in satisfactory condition with no s/s of distress noted.   

## 2022-06-08 ENCOUNTER — Other Ambulatory Visit (HOSPITAL_COMMUNITY): Payer: 59

## 2022-06-15 ENCOUNTER — Inpatient Hospital Stay (HOSPITAL_COMMUNITY): Payer: 59 | Attending: Hematology

## 2022-06-15 ENCOUNTER — Ambulatory Visit (HOSPITAL_COMMUNITY): Payer: 59 | Admitting: Physician Assistant

## 2022-06-15 DIAGNOSIS — D509 Iron deficiency anemia, unspecified: Secondary | ICD-10-CM | POA: Insufficient documentation

## 2022-06-15 DIAGNOSIS — Z87891 Personal history of nicotine dependence: Secondary | ICD-10-CM | POA: Insufficient documentation

## 2022-06-15 DIAGNOSIS — Z803 Family history of malignant neoplasm of breast: Secondary | ICD-10-CM | POA: Diagnosis not present

## 2022-06-15 DIAGNOSIS — R5383 Other fatigue: Secondary | ICD-10-CM | POA: Diagnosis not present

## 2022-06-15 DIAGNOSIS — D500 Iron deficiency anemia secondary to blood loss (chronic): Secondary | ICD-10-CM

## 2022-06-15 LAB — CBC WITH DIFFERENTIAL/PLATELET
Abs Immature Granulocytes: 0.03 10*3/uL (ref 0.00–0.07)
Basophils Absolute: 0.1 10*3/uL (ref 0.0–0.1)
Basophils Relative: 1 %
Eosinophils Absolute: 0.1 10*3/uL (ref 0.0–0.5)
Eosinophils Relative: 2 %
HCT: 47.6 % — ABNORMAL HIGH (ref 36.0–46.0)
Hemoglobin: 16 g/dL — ABNORMAL HIGH (ref 12.0–15.0)
Immature Granulocytes: 1 %
Lymphocytes Relative: 19 %
Lymphs Abs: 1.1 10*3/uL (ref 0.7–4.0)
MCH: 31.5 pg (ref 26.0–34.0)
MCHC: 33.6 g/dL (ref 30.0–36.0)
MCV: 93.7 fL (ref 80.0–100.0)
Monocytes Absolute: 0.3 10*3/uL (ref 0.1–1.0)
Monocytes Relative: 6 %
Neutro Abs: 4.3 10*3/uL (ref 1.7–7.7)
Neutrophils Relative %: 71 %
Platelets: 212 10*3/uL (ref 150–400)
RBC: 5.08 MIL/uL (ref 3.87–5.11)
RDW: 15 % (ref 11.5–15.5)
WBC: 5.9 10*3/uL (ref 4.0–10.5)
nRBC: 0 % (ref 0.0–0.2)

## 2022-06-15 LAB — IRON AND TIBC
Iron: 114 ug/dL (ref 28–170)
Saturation Ratios: 29 % (ref 10.4–31.8)
TIBC: 395 ug/dL (ref 250–450)
UIBC: 281 ug/dL

## 2022-06-15 LAB — FERRITIN: Ferritin: 179 ng/mL (ref 11–307)

## 2022-06-21 NOTE — Progress Notes (Deleted)
LATE TO APPT - RESCHEDULED

## 2022-06-22 ENCOUNTER — Inpatient Hospital Stay: Payer: 59 | Admitting: Physician Assistant

## 2022-06-24 NOTE — Progress Notes (Unsigned)
Virtual Visit via Telephone Note [facility]  I connected with Andrea [name]  on 06/25/2022 at 2:30 PM by telephone and verified that I am speaking with the correct person using two identifiers.  Location: Patient: Home Provider: [facility]   I discussed the limitations, risks, security and privacy concerns of performing an evaluation and management service by telephone and the availability of in person appointments. I also discussed with the patient that there may be a patient responsible charge related to this service. The patient expressed understanding and agreed to proceed.  REASON FOR VISIT:  Follow-up for iron deficiency anemia  CURRENT THERAPY: Intermittent IV iron  INTERVAL HISTORY:  Andrea [name] 81 y.o. female returns for routine follow-up of her iron deficiency anemia.  She was last seen by Rojelio Brenner PA-C on 03/12/2022.  At today's visit, she reports feeling poorly due to fatigue.  She reports increased fatigue and weakness has been progressive since her last visit.  She is frustrated that she did not feel any better after the IV iron in May and June 2023.  She wants answers as to why she is so fatigued. She continues to have symptoms of dizziness, restless legs, headaches, palpitations, and dyspnea on exertion.  She denies any chest pain, lightheadedness, or syncope.  She denies any obvious hematochezia or melena.  She does not take any iron tablet at home.  She has little to no energy and 100% appetite. She endorses that she is maintaining a stable weight.    OBSERVATIONS/OBJECTIVE: Review of Systems  Constitutional:  Positive for malaise/fatigue. Negative for chills, diaphoresis, fever and weight loss.  Respiratory:  Positive for shortness of breath. Negative for cough.   Cardiovascular:  Positive for palpitations. Negative for chest pain.  Gastrointestinal:  Positive for diarrhea and nausea. Negative for abdominal pain, blood in stool,  melena and vomiting.  Neurological:  Positive for dizziness, tingling and headaches.  Psychiatric/Behavioral:  Positive for depression. The patient is nervous/anxious.      PHYSICAL EXAM (per limitations of virtual telephone visit): The patient is alert and oriented x 3, exhibiting adequate mentation, good mood, and ability to speak in full sentences and execute sound judgement.   ASSESSMENT & PLAN: 1.  Iron deficiency anemia - Patient seen at the request of Dr. Sherril Croon - Labs on 12/01/2021 showed hemoglobin 8.8, MCV 72 and low MCH, MCHC and high RDW.  WBC and PLT are normal.  Iron studies show percent saturation 3, TIBC 446, ferritin 22. - She has a history of receiving parenteral iron therapy, last time in 2017. - Colonoscopy 2 years ago in [place] reportedly normal other than 2 polyps removed.  History of 2 units of blood transfusion. - She was symptomatic with severe fatigue, which was not improved after her iron -Most recent IV Venofer 300 mg x 3 from 04/15/2022 through 04/27/2022  - Denies any bleeding per rectum or melena    - Most recent labs (02/09/2022): Hgb 14.1/MCV 88.8, ferritin 44, iron saturation 9% with TIBC 476 - Normal reticulocytes, creatinine, LDH, SPEP, copper, methylmalonic acid, folate, and B12 when checked in January 2023  - Most recent labs (06/15/2022): Hgb 16.0/MCV 93.7, ferritin 179, 29% iron saturation - PLAN: No indication for IV iron at this time - Repeat labs and RTC in 6 months  - If any significant drops in Hgb and iron, would consider the stool work-up for occult GI blood loss. - Patient advised to make appointment with PCP for  further evaluation and work-up of her ongoing fatigue and other symptoms.  2.  Social/family history: - She lives at home with her grandson.  She worked at [employer] prior to retirement.  She had exposure to some chemicals.  She quit smoking 10 years ago.  She smoked 1 pack/week for 50 years. - Maternal aunt had breast cancer.    I  discussed the assessment and treatment plan with the patient. The patient was provided an opportunity to ask questions and all were answered. The patient agreed with the plan and demonstrated an understanding of the instructions.   The patient was advised to call back or seek an in-person evaluation if the symptoms worsen or if the condition fails to improve as anticipated.  I provided 23 minutes of non-face-to-face time during this encounter.   Carnella Guadalajara, PA-C 06/26/2022 6:58 AM

## 2022-06-25 ENCOUNTER — Inpatient Hospital Stay: Payer: 59 | Attending: Physician Assistant | Admitting: Physician Assistant

## 2022-06-25 DIAGNOSIS — R5382 Chronic fatigue, unspecified: Secondary | ICD-10-CM | POA: Diagnosis not present

## 2022-06-25 DIAGNOSIS — D500 Iron deficiency anemia secondary to blood loss (chronic): Secondary | ICD-10-CM | POA: Diagnosis not present

## 2022-06-26 ENCOUNTER — Encounter (HOSPITAL_COMMUNITY): Payer: Self-pay | Admitting: Hematology

## 2022-07-05 ENCOUNTER — Ambulatory Visit: Payer: 59 | Admitting: Physician Assistant

## 2022-08-27 ENCOUNTER — Other Ambulatory Visit: Payer: Self-pay | Admitting: *Deleted

## 2022-08-27 MED ORDER — LISINOPRIL 20 MG PO TABS: 20.0000 mg | ORAL_TABLET | Freq: Every day | ORAL | 1 refills | Status: DC

## 2022-09-30 ENCOUNTER — Ambulatory Visit: Payer: 59 | Attending: Cardiology | Admitting: Cardiology

## 2022-09-30 ENCOUNTER — Encounter: Payer: Self-pay | Admitting: Cardiology

## 2022-09-30 VITALS — BP 165/90 | HR 114 | Ht 62.0 in | Wt 213.2 lb

## 2022-09-30 DIAGNOSIS — I10 Essential (primary) hypertension: Secondary | ICD-10-CM

## 2022-09-30 DIAGNOSIS — I4821 Permanent atrial fibrillation: Secondary | ICD-10-CM | POA: Diagnosis not present

## 2022-09-30 MED ORDER — METOPROLOL SUCCINATE ER 50 MG PO TB24: 50.0000 mg | ORAL_TABLET | Freq: Every day | ORAL | 1 refills | Status: DC

## 2022-09-30 NOTE — Patient Instructions (Addendum)
Medication Instructions:  Your physician has recommended you make the following change in your medication:  Stop metoprolol tartrate Start metoprolol succinate 50 mg once a day. Continue all other medications as directed  Labwork: none  Testing/Procedures: none  Follow-Up:  Your physician recommends that you schedule a follow-up appointment in: 6 months  Any Other Special Instructions Will Be Listed Below (If Applicable).  If you need a refill on your cardiac medications before your next appointment, please call your pharmacy.

## 2022-09-30 NOTE — Progress Notes (Signed)
Clinical Summary Andrea [name] is a 81 y.o.female seen today for follow up of the following medical problems.   1.Permanent afib   -not on anticoag due to history of GI bleeding, not candidate for watchman as cannot tolerate short duration of anticoag   -some recent palpitations. Has not been compliant with meds, only takes about 3-4 days out of the week.     2. HTN - mixed compliance with meds     3.COPD on home O2 just as needed   4. OSA - not using cpap machine   5. Chronic iron deficient anemia - followed by hematology - has been on IV iron.    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     BREO ELLIPTA 100-25 MCG/INH AEPB Inhale 1 puff into the lungs daily.      clonazePAM (KLONOPIN) 0.5 MG tablet Take 0.5 mg by mouth daily as needed for anxiety.     diltiazem (CARDIZEM CD) 360 MG 24 hr capsule Take 1 capsule (360 mg total) by mouth daily. 90 capsule 3   ipratropium-albuterol (DUONEB) 0.5-2.5 (3) MG/3ML SOLN Take 3 mLs by nebulization every 4 (four) hours as needed. 360 mL    lisinopril (ZESTRIL) 20 MG tablet Take 1 tablet (20 mg total) by mouth daily. 90 tablet 1   metFORMIN (GLUCOPHAGE) 850 MG tablet Take 850 mg by mouth daily as needed.     metoprolol tartrate (LOPRESSOR) 25 MG tablet Take 1.5  tablets (37.5 mg total) by mouth 2 (two) times daily. 270 tablet 2   OXYGEN Inhale 3 L into the lungs at bedtime.     tetrahydrozoline-zinc (VISINE-AC) 0.05-0.25 % ophthalmic solution Place 2 drops into both eyes 3 (three) times daily as needed (Red Itchy Eyes).     No current facility-administered medications for this visit.     Past Surgical History:  Procedure Laterality Date   ABDOMINAL HYSTERECTOMY     GIVENS CAPSULE STUDY N/A 02/19/2013   Procedure: GIVENS CAPSULE STUDY;  Surgeon: Malissa Hippo, MD;  Location: [facility];  Service: Endoscopy;  Laterality: N/A;  730     Allergies  Allergen Reactions   Sulfa Antibiotics Itching and Other (See Comments)    Burning   Shellfish Allergy Hives, Itching and Swelling      Family History  Problem Relation Age of Onset   Hypertension Brother      Social History [name] reports that she quit smoking about 10 years ago. Her smoking use included cigarettes. She has a 10.00 pack-year smoking history. She has never used smokeless tobacco. [name] reports no history of alcohol use.   Review of Systems CONSTITUTIONAL: No weight  loss, fever, chills, weakness or fatigue.  HEENT: Eyes: No visual loss, blurred vision, double vision or yellow sclerae.No hearing loss, sneezing, congestion, runny nose or sore throat.  SKIN: No rash or itching.  CARDIOVASCULAR: per hpi RESPIRATORY: No shortness of breath, cough or sputum.  GASTROINTESTINAL: No anorexia, nausea, vomiting or diarrhea. No abdominal pain or blood.  GENITOURINARY: No burning on urination, no polyuria NEUROLOGICAL: No headache, dizziness, syncope, paralysis, ataxia, numbness or tingling in the extremities. No change in bowel or bladder control.  MUSCULOSKELETAL: No muscle, back pain, joint pain or stiffness.  LYMPHATICS: No enlarged nodes. No history of splenectomy.  PSYCHIATRIC: No history of depression or anxiety.  ENDOCRINOLOGIC: No reports of sweating, cold or heat  intolerance. No polyuria or polydipsia.  Marland Kitchen   Physical Examination Today's Vitals   09/30/22 1057  Pulse: (!) 114  SpO2: 96%  Weight: 213 lb 3.2 oz (96.7 kg)  Height: 5\' 2"  (1.575 m)   Body mass index is 38.99 kg/m.  Gen: resting comfortably, no acute distress HEENT: no scleral icterus, pupils equal round and reactive, no palptable cervical adenopathy,  CV: irreg, tachy, no mrg, no jvd Resp: Clear to auscultation bilaterally GI: abdomen is soft, non-tender, non-distended, normal bowel sounds, no hepatosplenomegaly MSK: extremities are warm, no edema.  Skin: warm, no rash Neuro:  no focal deficits Psych: appropriate affect   Diagnostic Studies  07/2021 echo  IMPRESSIONS     1. Left ventricular ejection fraction, by estimation, is 55 to 60%. The  left ventricle has normal function. The left ventricle has no regional  wall motion abnormalities. There is moderate left ventricular hypertrophy.  Left ventricular diastolic  parameters are indeterminate. Elevated left atrial pressure.   2. Right ventricular systolic function is normal. The right ventricular  size is normal. There is mildly elevated pulmonary artery systolic  pressure.   3. Left atrial size was moderately dilated.   4. A small pericardial effusion is present. The pericardial effusion is  posterior to the left ventricle.   5. The mitral valve is grossly normal. Mild mitral valve regurgitation.   6. The aortic valve is tricuspid. There is mild calcification of the  aortic valve. Aortic valve regurgitation is mild. No aortic stenosis is  present.   7. The inferior vena cava is normal in size with greater than 50%  respiratory variability, suggesting right atrial pressure of 3 mmHg.    Assessment and Plan  1.Permanent afib - elevated heart rates and palpitaitons but has not been compliant with her diltiazem and lopressor. Difficult to take lopressor bid and also for her to cut the tablet. Will change lopressor  to toprol 50mg  daily, encouraged increased compliance with diltiazem - not on anticoag due to chronic anemia, GI bleed history.    2. HTN - above goal but not compliant with meds, encouraged increased compliance.   F/u 6 months  08/2021, M.D.

## 2022-11-11 IMAGING — CR DG HIP (WITH OR WITHOUT PELVIS) 2-3V*L*
3 series · 3 of 3 positions shown · non-contrast
Comparison: None.

CLINICAL DATA: Left hip pain since yesterday

EXAM:
DG HIP (WITH OR WITHOUT PELVIS) 2-3V LEFT

[t ap pelvis]
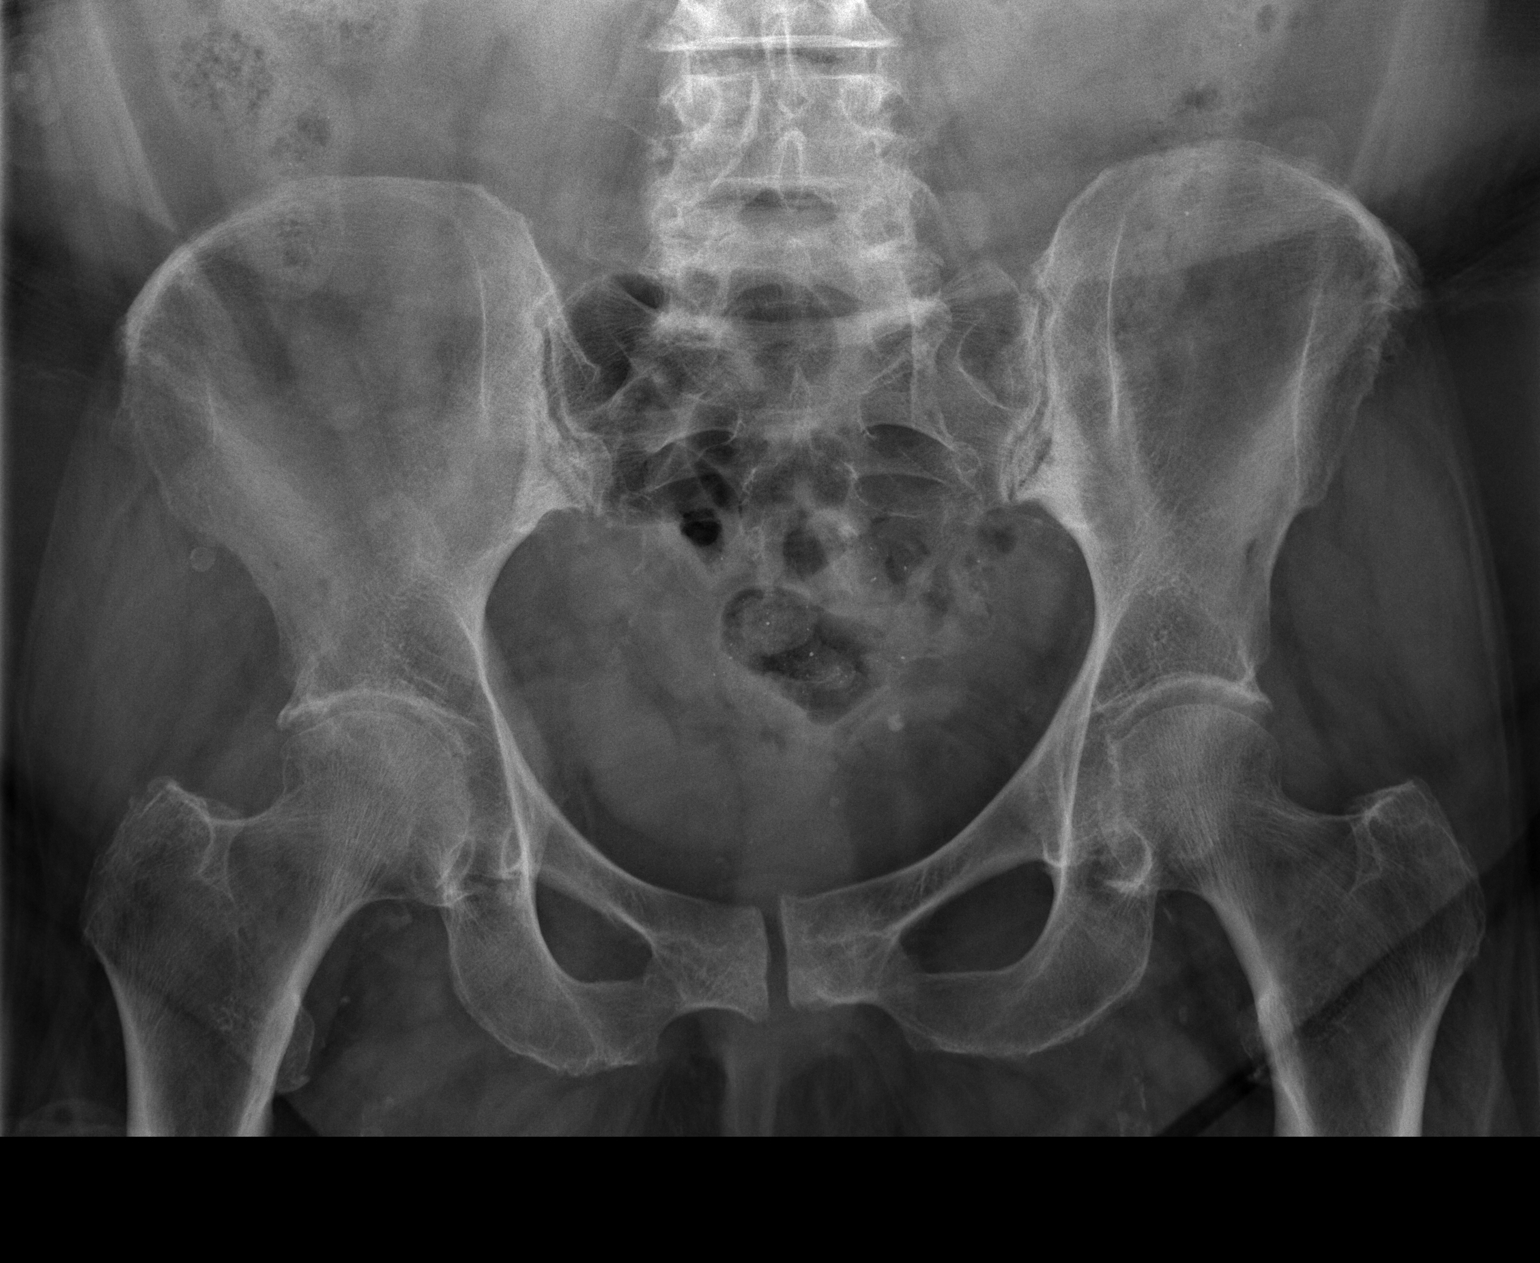

[t hip ap left]
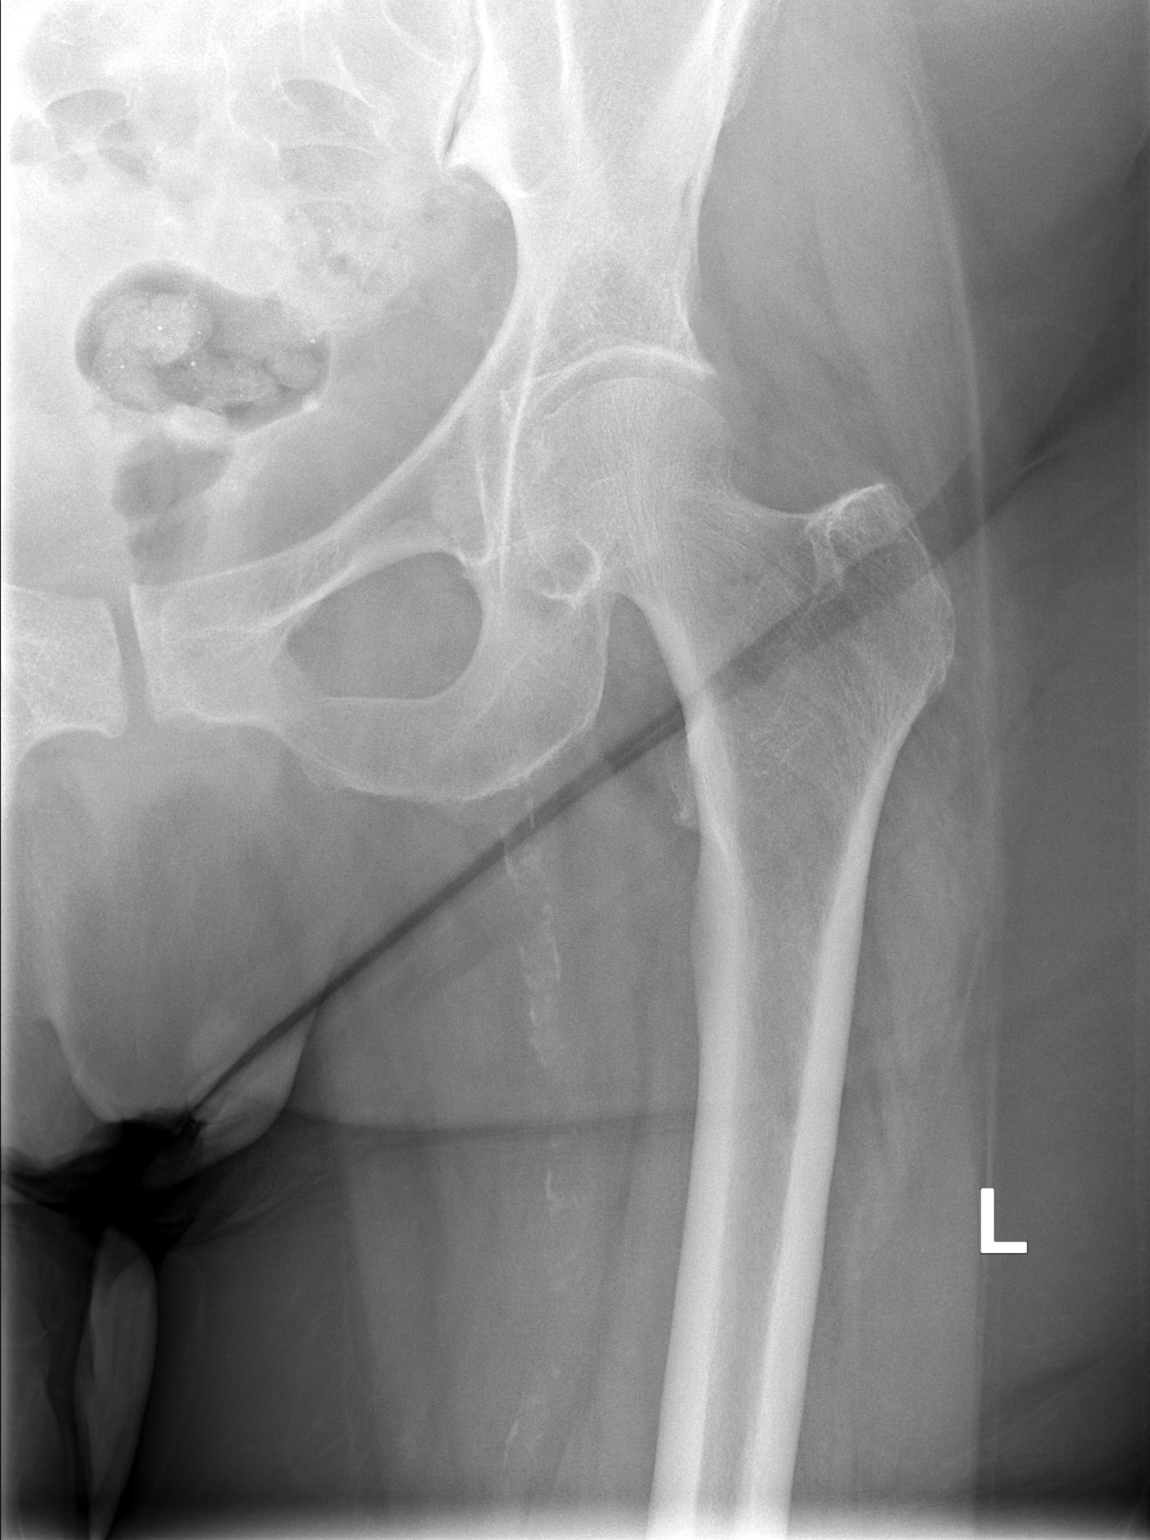

[t hip frog leg left]
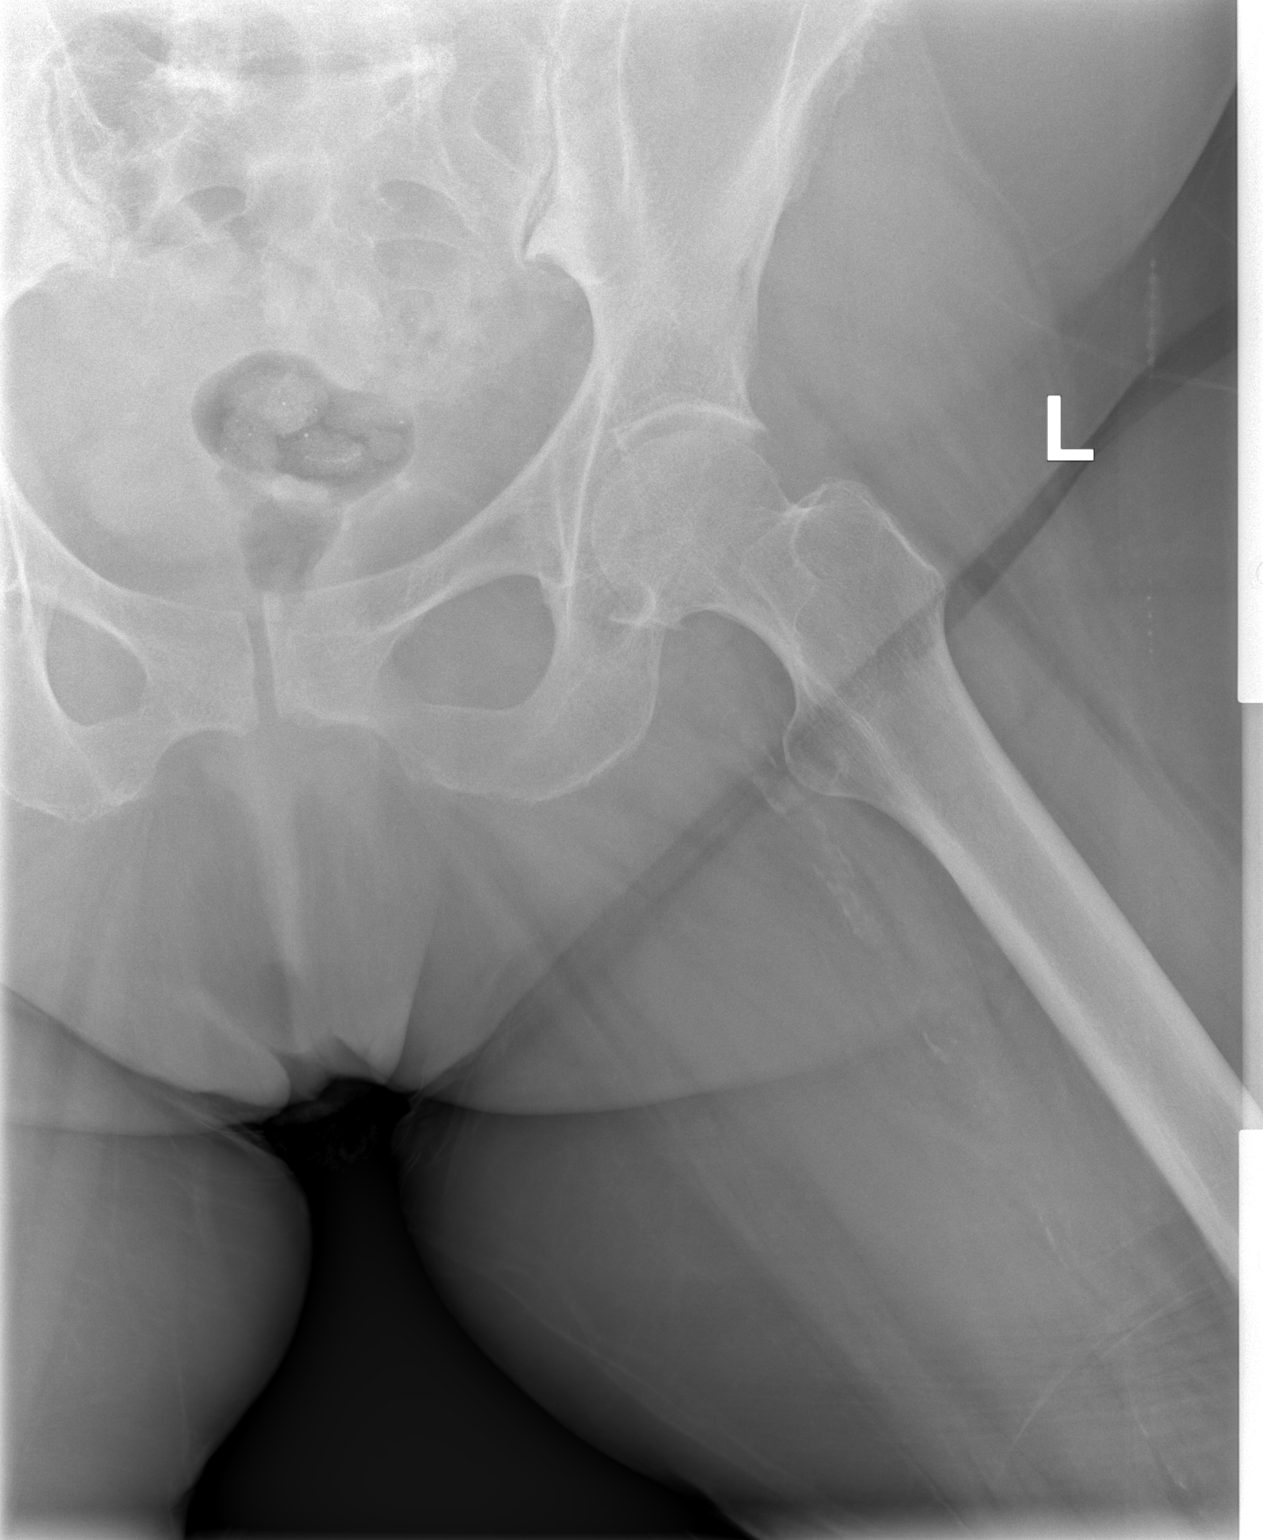

[3 of 3 positions shown; findings below may reference images not displayed]

FINDINGS: There is no evidence of hip fracture or dislocation. Degenerative
narrowing right greater than left acetabulum and right greater than
left sacroiliac joint. Vascular calcifications. Evaluation of sacrum
is limited by overlying bowel gas
IMPRESSION: No acute fracture or dislocation.

## 2022-12-23 ENCOUNTER — Inpatient Hospital Stay: Payer: 59

## 2022-12-24 ENCOUNTER — Inpatient Hospital Stay: Payer: 59 | Attending: Hematology

## 2022-12-24 DIAGNOSIS — Z803 Family history of malignant neoplasm of breast: Secondary | ICD-10-CM | POA: Diagnosis not present

## 2022-12-24 DIAGNOSIS — R5383 Other fatigue: Secondary | ICD-10-CM | POA: Diagnosis not present

## 2022-12-24 DIAGNOSIS — E538 Deficiency of other specified B group vitamins: Secondary | ICD-10-CM | POA: Insufficient documentation

## 2022-12-24 DIAGNOSIS — D500 Iron deficiency anemia secondary to blood loss (chronic): Secondary | ICD-10-CM

## 2022-12-24 DIAGNOSIS — R42 Dizziness and giddiness: Secondary | ICD-10-CM | POA: Insufficient documentation

## 2022-12-24 DIAGNOSIS — E559 Vitamin D deficiency, unspecified: Secondary | ICD-10-CM | POA: Diagnosis not present

## 2022-12-24 DIAGNOSIS — Z87891 Personal history of nicotine dependence: Secondary | ICD-10-CM | POA: Diagnosis not present

## 2022-12-24 DIAGNOSIS — D509 Iron deficiency anemia, unspecified: Secondary | ICD-10-CM | POA: Diagnosis not present

## 2022-12-24 DIAGNOSIS — D751 Secondary polycythemia: Secondary | ICD-10-CM | POA: Insufficient documentation

## 2022-12-24 DIAGNOSIS — R519 Headache, unspecified: Secondary | ICD-10-CM | POA: Insufficient documentation

## 2022-12-24 DIAGNOSIS — R5382 Chronic fatigue, unspecified: Secondary | ICD-10-CM

## 2022-12-24 LAB — CBC WITH DIFFERENTIAL/PLATELET
Abs Immature Granulocytes: 0.05 10*3/uL (ref 0.00–0.07)
Basophils Absolute: 0.1 10*3/uL (ref 0.0–0.1)
Basophils Relative: 1 %
Eosinophils Absolute: 0.1 10*3/uL (ref 0.0–0.5)
Eosinophils Relative: 1 %
HCT: 48.9 % — ABNORMAL HIGH (ref 36.0–46.0)
Hemoglobin: 16.2 g/dL — ABNORMAL HIGH (ref 12.0–15.0)
Immature Granulocytes: 1 %
Lymphocytes Relative: 17 %
Lymphs Abs: 1.1 10*3/uL (ref 0.7–4.0)
MCH: 31.9 pg (ref 26.0–34.0)
MCHC: 33.1 g/dL (ref 30.0–36.0)
MCV: 96.3 fL (ref 80.0–100.0)
Monocytes Absolute: 0.4 10*3/uL (ref 0.1–1.0)
Monocytes Relative: 6 %
Neutro Abs: 4.9 10*3/uL (ref 1.7–7.7)
Neutrophils Relative %: 74 %
Platelets: 241 10*3/uL (ref 150–400)
RBC: 5.08 MIL/uL (ref 3.87–5.11)
RDW: 12.3 % (ref 11.5–15.5)
WBC: 6.5 10*3/uL (ref 4.0–10.5)
nRBC: 0 % (ref 0.0–0.2)

## 2022-12-24 LAB — COMPREHENSIVE METABOLIC PANEL
ALT: 9 U/L (ref 0–44)
AST: 18 U/L (ref 15–41)
Albumin: 4.1 g/dL (ref 3.5–5.0)
Alkaline Phosphatase: 101 U/L (ref 38–126)
Anion gap: 11 (ref 5–15)
BUN: 16 mg/dL (ref 8–23)
CO2: 21 mmol/L — ABNORMAL LOW (ref 22–32)
Calcium: 9.2 mg/dL (ref 8.9–10.3)
Chloride: 106 mmol/L (ref 98–111)
Creatinine, Ser: 0.84 mg/dL (ref 0.44–1.00)
GFR, Estimated: >60 mL/min (ref >60)
Glucose, Bld: 151 mg/dL — ABNORMAL HIGH (ref 70–99)
Potassium: 3.8 mmol/L (ref 3.5–5.1)
Sodium: 138 mmol/L (ref 135–145)
Total Bilirubin: 0.9 mg/dL (ref 0.3–1.2)
Total Protein: 7.3 g/dL (ref 6.5–8.1)

## 2022-12-24 LAB — FOLATE: Folate: 8.8 ng/mL (ref >5.9)

## 2022-12-24 LAB — IRON AND TIBC
Iron: 93 ug/dL (ref 28–170)
Saturation Ratios: 23 % (ref 10.4–31.8)
TIBC: 404 ug/dL (ref 250–450)
UIBC: 311 ug/dL

## 2022-12-24 LAB — FERRITIN: Ferritin: 59 ng/mL (ref 11–307)

## 2022-12-24 LAB — VITAMIN B12: Vitamin B-12: 158 pg/mL — ABNORMAL LOW (ref 180–914)

## 2022-12-25 LAB — MISC LABCORP TEST (SEND OUT): Labcorp test code: 81950

## 2022-12-29 LAB — METHYLMALONIC ACID, SERUM: Methylmalonic Acid, Quantitative: 344 nmol/L (ref 0–378)

## 2022-12-29 NOTE — Progress Notes (Unsigned)
VIRTUAL VISIT via [place]   I connected with Andrea [name]  on 12/30/22 at  10:44 AM by telephone and verified that I am speaking with the correct person using two identifiers.  Location: Patient: Home Provider: [facility]   I discussed the limitations, risks, security and privacy concerns of performing an evaluation and management service by telephone and the availability of in person appointments. I also discussed with the patient that there may be a patient responsible charge related to this service. The patient expressed understanding and agreed to proceed.  REASON FOR VISIT:  Follow-up for iron deficiency anemia   CURRENT THERAPY: Intermittent IV iron  INTERVAL HISTORY:  Andrea [name] is contacted today for follow-up of iron deficiency anemia.  She was last evaluated via telemedicine visit by Tarri Abernethy PA-C on 06/25/2022.  At today's visit, she reports feeling poorly due to fatigue.  She continues to have increased fatigue and weakness has been progressive since her last visit.  She continues to have symptoms of dizziness, restless legs, headaches, palpitations, and dyspnea on exertion.  She denies any chest pain, lightheadedness, or syncope.  She denies any obvious hematochezia or melena. She does not take any iron tablet at home.  Regarding her erythrocytosis, she denies any history of sleep apnea but reports that she is supposed to be wearing 3 L supplemental oxygen at night, but does not like to use it every night.  She is not on any diuretic medications and is a non-smoker.  She has some general itchiness but denies any aquagenic pruritus.  She continues to have chronic dizziness and headaches.  No other vasomotor symptoms  She reports 40% energy and 100% appetite.  She reports that she is maintaining a stable weight.  REVIEW OF SYSTEMS:   Review of Systems  Constitutional:  Positive for malaise/fatigue. Negative for  chills, diaphoresis, fever and weight loss.  Respiratory:  Positive for shortness of breath. Negative for cough.   Cardiovascular:  Negative for chest pain and palpitations.  Gastrointestinal:  Positive for diarrhea and nausea. Negative for abdominal pain, blood in stool, melena and vomiting.  Neurological:  Positive for dizziness, tingling and headaches.  Psychiatric/Behavioral:  Positive for depression. The patient is nervous/anxious.      PHYSICAL EXAM: (per limitations of virtual telephone visit)  The patient is alert and oriented x 3, exhibiting adequate mentation, good mood, and ability to speak in full sentences and execute sound judgement.  ASSESSMENT & PLAN:  1.  Iron deficiency anemia + vitamin B12 deficiency - Patient seen at the request of Dr. Woody Seller - Labs on 12/01/2021 showed hemoglobin 8.8, MCV 72 and low MCH, MCHC and high RDW.  WBC and PLT are normal.  Iron studies show percent saturation 3, TIBC 446, ferritin 22. - She has a history of receiving parenteral iron therapy, last time in 2017. - Colonoscopy 2 years ago in [place] reportedly normal other than 2 polyps removed.  History of 2 units of blood transfusion. - She was symptomatic with severe fatigue, which was not improved after her iron - Most recent IV Venofer 300 mg x 3 from 04/15/2022 through 04/27/2022  - Denies any bleeding per rectum or melena    - Normal reticulocytes, creatinine, LDH, SPEP, copper, methylmalonic acid, folate, and B12 when checked in January 2023  - Most recent labs (12/24/2022): Hgb 16.2/MCV 96.3.  Ferritin 59, iron saturation 23%.  Low vitamin B12 158.  Normal folate. -  PLAN: Patient eligible for IV iron due to severe fatigue with ferritin <100.  She declines at this time and would like to wait and see how her labs look at next visit, since "iron did nothing to help her in the past" - Recommend vitamin B12 repletion with weekly injections x 4 followed by monthly injections. - Labs in 3 months  (CBC/D, ferritin, iron/TIBC) - OFFICE visit in 3 months, 2 weeks after labs  - If any significant drops in Hgb and iron, would consider the stool work-up for occult GI blood loss.   2.  Severe Vitamin D deficiency - Labs from 12/24/2022 shows severe vitamin D deficiency at 7.1 - She does not currently take any vitamin D supplement - PLAN: Prescription sent to pharmacy for vitamin D 50,000 units once a week.  3.  Vitamin B12 deficiency - Labs from 12/24/2022 show low vitamin B12 at 158, normal MMA. - She does not currently take any vitamin B12 supplement. - PLAN: We will start her on vitamin B12 repletion with vitamin B12 injection weekly x 4 weeks followed by monthly injections  4.  Erythrocytosis - Mild erythrocytosis since July 2023 with most recent CBC (12/24/2022): Hgb 16.2/hematocrit 48.9 - She  denies any history of sleep apnea but reports that she is supposed to be wearing 3 L supplemental oxygen at night, but does not like to use it every night. - Non-smoker.  Not on any diuretic medications. - She has some general itchiness but denies any aquagenic pruritus.  She continues to have chronic dizziness and headaches.  No other vasomotor symptoms - PLAN: Patient encouraged to wear oxygen nightly. - We will check erythropoietin and JAK2 with reflex at next visit  5.  Social/family history: - She lives at home with her grandson.  She worked at [employer] prior to retirement.  She had exposure to some chemicals.  She quit smoking 10 years ago.  She smoked 1 pack/week for 50 years. - Maternal aunt had breast cancer.    PLAN SUMMARY: >> Vitamin B12 injection weekly x 4 weeks followed by monthly vitamin B12 injection  >> Labs in 3 months (CBC/D, ferritin, iron/TIBC, B12, MMA, erythropoietin, JAK2 with reflex, vitamin D) >> Office visit in 3 months (TWO weeks after labs)     I discussed the assessment and treatment plan with the patient. The patient was provided an opportunity to ask questions  and all were answered. The patient agreed with the plan and demonstrated an understanding of the instructions.   The patient was advised to call back or seek an in-person evaluation if the symptoms worsen or if the condition fails to improve as anticipated.  I provided 22 minutes of non-face-to-face time during this encounter.   Harriett Rush, PA-C 12/30/22 11:15 AM

## 2022-12-30 ENCOUNTER — Inpatient Hospital Stay (HOSPITAL_BASED_OUTPATIENT_CLINIC_OR_DEPARTMENT_OTHER): Payer: 59 | Admitting: Physician Assistant

## 2022-12-30 ENCOUNTER — Other Ambulatory Visit: Payer: Self-pay

## 2022-12-30 DIAGNOSIS — E559 Vitamin D deficiency, unspecified: Secondary | ICD-10-CM | POA: Insufficient documentation

## 2022-12-30 DIAGNOSIS — R5382 Chronic fatigue, unspecified: Secondary | ICD-10-CM | POA: Diagnosis not present

## 2022-12-30 DIAGNOSIS — E538 Deficiency of other specified B group vitamins: Secondary | ICD-10-CM

## 2022-12-30 DIAGNOSIS — D500 Iron deficiency anemia secondary to blood loss (chronic): Secondary | ICD-10-CM

## 2022-12-30 DIAGNOSIS — D509 Iron deficiency anemia, unspecified: Secondary | ICD-10-CM

## 2022-12-30 DIAGNOSIS — Z862 Personal history of diseases of the blood and blood-forming organs and certain disorders involving the immune mechanism: Secondary | ICD-10-CM

## 2022-12-30 MED ORDER — ERGOCALCIFEROL 1.25 MG (50000 UT) PO CAPS: 50000.0000 [IU] | ORAL_CAPSULE | ORAL | 1 refills | Status: DC

## 2023-01-11 ENCOUNTER — Inpatient Hospital Stay: Payer: 59

## 2023-01-11 VITALS — BP 183/64 | HR 109 | Temp 97.9°F | Resp 18

## 2023-01-11 DIAGNOSIS — D500 Iron deficiency anemia secondary to blood loss (chronic): Secondary | ICD-10-CM

## 2023-01-11 DIAGNOSIS — E538 Deficiency of other specified B group vitamins: Secondary | ICD-10-CM | POA: Diagnosis not present

## 2023-01-11 MED ORDER — CYANOCOBALAMIN 1000 MCG/ML IJ SOLN
1000.0000 ug | Freq: Once | INTRAMUSCULAR | Status: AC
  Administered 2023-01-11: 1000 ug via INTRAMUSCULAR
  Filled 2023-01-11: qty 1

## 2023-01-11 NOTE — Patient Instructions (Signed)
[facility]  Discharge Instructions: Thank you for choosing [facility] to provide your oncology and hematology care.  If you have a lab appointment with the [facility], please come in thru the Main Entrance and check in at the main information desk.  Wear comfortable clothing and clothing appropriate for easy access to any Portacath or PICC line.   We strive to give you quality time with your provider. You may need to reschedule your appointment if you arrive late (15 or more minutes).  Arriving late affects you and other patients whose appointments are after yours.  Also, if you miss three or more appointments without notifying the office, you may be dismissed from the clinic at the provider's discretion.      For prescription refill requests, have your pharmacy contact our office and allow 72 hours for refills to be completed.    Today you received your first B12 injection   To help prevent nausea and vomiting after your treatment, we encourage you to take your nausea medication as directed.  BELOW ARE SYMPTOMS THAT SHOULD BE REPORTED IMMEDIATELY: *FEVER GREATER THAN 100.4 F (38 C) OR HIGHER *CHILLS OR SWEATING *NAUSEA AND VOMITING THAT IS NOT CONTROLLED WITH YOUR NAUSEA MEDICATION *UNUSUAL SHORTNESS OF BREATH *UNUSUAL BRUISING OR BLEEDING *URINARY PROBLEMS (pain or burning when urinating, or frequent urination) *BOWEL PROBLEMS (unusual diarrhea, constipation, pain near the anus) TENDERNESS IN MOUTH AND THROAT WITH OR WITHOUT PRESENCE OF ULCERS (sore throat, sores in mouth, or a toothache) UNUSUAL RASH, SWELLING OR PAIN  UNUSUAL VAGINAL DISCHARGE OR ITCHING   Items with * indicate a potential emergency and should be followed up as soon as possible or go to the Emergency Department if any problems should occur.  Please show the CHEMOTHERAPY ALERT CARD or IMMUNOTHERAPY ALERT CARD at check-in to the Emergency Department and triage nurse.  Should  you have questions after your visit or need to cancel or reschedule your appointment, please contact [facility] [phone]  and follow the prompts.  Office hours are 8:00 a.m. to 4:30 p.m. Monday - Friday. Please note that voicemails left after 4:00 p.m. may not be returned until the following business day.  We are closed weekends and major holidays. You have access to a nurse at all times for urgent questions. Please call the main number to the clinic [phone] and follow the prompts.  For any non-urgent questions, you may also contact your provider using MyChart. We now offer e-Visits for anyone 83 and older to request care online for non-urgent symptoms. For details visit [email or web address].   Also download the MyChart app! Go to the app store, search "MyChart", open the app, select [facility], and log in with your MyChart username and password.

## 2023-01-11 NOTE — Progress Notes (Signed)
Patient here for first b12 injection today. Blood Pressure slightly elevated. Patient stated she had not taken any of her blood pressure medications today. She did take them at the bedside while here.   B12 injection given per orders. Patient tolerated it well without problems. Vitals stable and discharged home from clinic via wheelchair. Follow up as scheduled.

## 2023-01-18 ENCOUNTER — Inpatient Hospital Stay: Payer: 59

## 2023-01-20 ENCOUNTER — Inpatient Hospital Stay: Payer: 59

## 2023-01-20 VITALS — BP 155/82 | HR 83 | Temp 98.3°F | Resp 18

## 2023-01-20 DIAGNOSIS — E538 Deficiency of other specified B group vitamins: Secondary | ICD-10-CM | POA: Diagnosis not present

## 2023-01-20 DIAGNOSIS — D500 Iron deficiency anemia secondary to blood loss (chronic): Secondary | ICD-10-CM

## 2023-01-20 MED ORDER — CYANOCOBALAMIN 1000 MCG/ML IJ SOLN
1000.0000 ug | Freq: Once | INTRAMUSCULAR | Status: AC
  Administered 2023-01-20: 1000 ug via INTRAMUSCULAR
  Filled 2023-01-20: qty 1

## 2023-01-20 NOTE — Patient Instructions (Signed)
[facility]  Discharge Instructions: Thank you for choosing [facility] to provide your oncology and hematology care.  If you have a lab appointment with the [facility], please come in thru the Main Entrance and check in at the main information desk.  Wear comfortable clothing and clothing appropriate for easy access to any Portacath or PICC line.   We strive to give you quality time with your provider. You may need to reschedule your appointment if you arrive late (15 or more minutes).  Arriving late affects you and other patients whose appointments are after yours.  Also, if you miss three or more appointments without notifying the office, you may be dismissed from the clinic at the provider's discretion.      For prescription refill requests, have your pharmacy contact our office and allow 72 hours for refills to be completed.    Today you received the following chemotherapy and/or immunotherapy agents B12 injection. Vitamin B12 Injection What is this medication? Vitamin B12 (VAHY tuh min B12) prevents and treats low vitamin B12 levels in your body. It is used in people who do not get enough vitamin B12 from their diet or when their digestive tract does not absorb enough. Vitamin B12 plays an important role in maintaining the health of your nervous system and red blood cells. This medicine may be used for other purposes; ask your health care provider or pharmacist if you have questions. COMMON BRAND NAME(S): B-12 Compliance Kit, B-12 Injection Kit, Cyomin, Dodex, LA-12, Nutri-Twelve, Physicians EZ Use B-12, Primabalt What should I tell my care team before I take this medication? They need to know if you have any of these conditions: Kidney disease Leber's disease Megaloblastic anemia An unusual or allergic reaction to cyanocobalamin, cobalt, other medications, foods, dyes, or preservatives Pregnant or trying to get pregnant Breast-feeding How should I  use this medication? This medication is injected into a muscle or deeply under the skin. It is usually given in a clinic or care team's office. However, your care team may teach you how to inject yourself. Follow all instructions. Talk to your care team about the use of this medication in children. Special care may be needed. Overdosage: If you think you have taken too much of this medicine contact a poison control center or emergency room at once. NOTE: This medicine is only for you. Do not share this medicine with others. What if I miss a dose? If you are given your dose at a clinic or care team's office, call to reschedule your appointment. If you give your own injections, and you miss a dose, take it as soon as you can. If it is almost time for your next dose, take only that dose. Do not take double or extra doses. What may interact with this medication? Alcohol Colchicine This list may not describe all possible interactions. Give your health care provider a list of all the medicines, herbs, non-prescription drugs, or dietary supplements you use. Also tell them if you smoke, drink alcohol, or use illegal drugs. Some items may interact with your medicine. What should I watch for while using this medication? Visit your care team regularly. You may need blood work done while you are taking this medication. You may need to follow a special diet. Talk to your care team. Limit your alcohol intake and avoid smoking to get the best benefit. What side effects may I notice from receiving this medication? Side effects that you should report  to your care team as soon as possible: Allergic reactions--skin rash, itching, hives, swelling of the face, lips, tongue, or throat Swelling of the ankles, hands, or feet Trouble breathing Side effects that usually do not require medical attention (report to your care team if they continue or are bothersome): Diarrhea This list may not describe all possible side  effects. Call your doctor for medical advice about side effects. You may report side effects to FDA at 1-800-FDA-1088. Where should I keep my medication? Keep out of the reach of children. Store at room temperature between 15 and 30 degrees C (59 and 85 degrees F). Protect from light. Throw away any unused medication after the expiration date. NOTE: This sheet is a summary. It may not cover all possible information. If you have questions about this medicine, talk to your doctor, pharmacist, or health care provider.  2023 Elsevier/Gold Standard (2021-07-21 00:00:00)       To help prevent nausea and vomiting after your treatment, we encourage you to take your nausea medication as directed.  BELOW ARE SYMPTOMS THAT SHOULD BE REPORTED IMMEDIATELY: *FEVER GREATER THAN 100.4 F (38 C) OR HIGHER *CHILLS OR SWEATING *NAUSEA AND VOMITING THAT IS NOT CONTROLLED WITH YOUR NAUSEA MEDICATION *UNUSUAL SHORTNESS OF BREATH *UNUSUAL BRUISING OR BLEEDING *URINARY PROBLEMS (pain or burning when urinating, or frequent urination) *BOWEL PROBLEMS (unusual diarrhea, constipation, pain near the anus) TENDERNESS IN MOUTH AND THROAT WITH OR WITHOUT PRESENCE OF ULCERS (sore throat, sores in mouth, or a toothache) UNUSUAL RASH, SWELLING OR PAIN  UNUSUAL VAGINAL DISCHARGE OR ITCHING   Items with * indicate a potential emergency and should be followed up as soon as possible or go to the Emergency Department if any problems should occur.  Please show the CHEMOTHERAPY ALERT CARD or IMMUNOTHERAPY ALERT CARD at check-in to the Emergency Department and triage nurse.  Should you have questions after your visit or need to cancel or reschedule your appointment, please contact [facility] [phone]  and follow the prompts.  Office hours are 8:00 a.m. to 4:30 p.m. Monday - Friday. Please note that voicemails left after 4:00 p.m. may not be returned until the following business day.  We are closed  weekends and major holidays. You have access to a nurse at all times for urgent questions. Please call the main number to the clinic [phone] and follow the prompts.  For any non-urgent questions, you may also contact your provider using MyChart. We now offer e-Visits for anyone 75 and older to request care online for non-urgent symptoms. For details visit [email or web address].   Also download the MyChart app! Go to the app store, search "MyChart", open the app, select [facility], and log in with your MyChart username and password.

## 2023-01-20 NOTE — Progress Notes (Signed)
Andrea [name] presents today for injection per the provider's orders.  B12 administration without incident; injection site WNL; see MAR for injection details.  Patient tolerated procedure well and without incident.  No questions or complaints noted at this time. Blood pressure elevated on arrival. Patient has taken blood pressure medication this morning.   Vital signs stable. No complaints at this time. Discharged from clinic by wheel chair in stable condition. Alert and oriented x 3. F/U with [facility] as scheduled.

## 2023-01-25 ENCOUNTER — Inpatient Hospital Stay: Payer: 59

## 2023-01-27 ENCOUNTER — Inpatient Hospital Stay: Payer: 59 | Attending: Physician Assistant

## 2023-01-27 VITALS — BP 159/89 | HR 75 | Temp 98.5°F | Resp 20

## 2023-01-27 DIAGNOSIS — E538 Deficiency of other specified B group vitamins: Secondary | ICD-10-CM | POA: Diagnosis not present

## 2023-01-27 DIAGNOSIS — D500 Iron deficiency anemia secondary to blood loss (chronic): Secondary | ICD-10-CM

## 2023-01-27 MED ORDER — CYANOCOBALAMIN 1000 MCG/ML IJ SOLN
1000.0000 ug | Freq: Once | INTRAMUSCULAR | Status: AC
  Administered 2023-01-27: 1000 ug via INTRAMUSCULAR
  Filled 2023-01-27: qty 1

## 2023-02-01 ENCOUNTER — Inpatient Hospital Stay: Payer: 59

## 2023-02-03 ENCOUNTER — Inpatient Hospital Stay: Payer: 59

## 2023-02-03 VITALS — BP 136/70 | HR 51 | Temp 99.0°F | Resp 20

## 2023-02-03 DIAGNOSIS — D500 Iron deficiency anemia secondary to blood loss (chronic): Secondary | ICD-10-CM

## 2023-02-03 DIAGNOSIS — E538 Deficiency of other specified B group vitamins: Secondary | ICD-10-CM | POA: Diagnosis not present

## 2023-02-03 MED ORDER — CYANOCOBALAMIN 1000 MCG/ML IJ SOLN
1000.0000 ug | Freq: Once | INTRAMUSCULAR | Status: AC
  Administered 2023-02-03: 1000 ug via INTRAMUSCULAR
  Filled 2023-02-03: qty 1

## 2023-02-03 NOTE — Patient Instructions (Signed)
[facility]  Discharge Instructions: Thank you for choosing [facility] to provide your oncology and hematology care.  If you have a lab appointment with the [facility], please come in thru the Main Entrance and check in at the main information desk.  Wear comfortable clothing and clothing appropriate for easy access to any Portacath or PICC line.   We strive to give you quality time with your provider. You may need to reschedule your appointment if you arrive late (15 or more minutes).  Arriving late affects you and other patients whose appointments are after yours.  Also, if you miss three or more appointments without notifying the office, you may be dismissed from the clinic at the provider's discretion.      For prescription refill requests, have your pharmacy contact our office and allow 72 hours for refills to be completed.    Today you received the following B12 injection.  To help prevent nausea and vomiting after your treatment, we encourage you to take your nausea medication as directed.  BELOW ARE SYMPTOMS THAT SHOULD BE REPORTED IMMEDIATELY: *FEVER GREATER THAN 100.4 F (38 C) OR HIGHER *CHILLS OR SWEATING *NAUSEA AND VOMITING THAT IS NOT CONTROLLED WITH YOUR NAUSEA MEDICATION *UNUSUAL SHORTNESS OF BREATH *UNUSUAL BRUISING OR BLEEDING *URINARY PROBLEMS (pain or burning when urinating, or frequent urination) *BOWEL PROBLEMS (unusual diarrhea, constipation, pain near the anus) TENDERNESS IN MOUTH AND THROAT WITH OR WITHOUT PRESENCE OF ULCERS (sore throat, sores in mouth, or a toothache) UNUSUAL RASH, SWELLING OR PAIN  UNUSUAL VAGINAL DISCHARGE OR ITCHING   Items with * indicate a potential emergency and should be followed up as soon as possible or go to the Emergency Department if any problems should occur.  Please show the CHEMOTHERAPY ALERT CARD or IMMUNOTHERAPY ALERT CARD at check-in to the Emergency Department and triage  nurse.  Should you have questions after your visit or need to cancel or reschedule your appointment, please contact [facility] [phone]  and follow the prompts.  Office hours are 8:00 a.m. to 4:30 p.m. Monday - Friday. Please note that voicemails left after 4:00 p.m. may not be returned until the following business day.  We are closed weekends and major holidays. You have access to a nurse at all times for urgent questions. Please call the main number to the clinic [phone] and follow the prompts.  For any non-urgent questions, you may also contact your provider using MyChart. We now offer e-Visits for anyone 29 and older to request care online for non-urgent symptoms. For details visit [email or web address].   Also download the MyChart app! Go to the app store, search "MyChart", open the app, select [facility], and log in with your MyChart username and password.

## 2023-02-03 NOTE — Progress Notes (Signed)
Patient arrived today for B12 injectio. Patient in satisfactory condition with no new complaints. Vital signs stable. Injection given in left deltoid, tolerated well. Patient left in satisfactory condition via wheelchair pushed by family member.

## 2023-02-17 ENCOUNTER — Other Ambulatory Visit: Payer: Self-pay | Admitting: Cardiology

## 2023-03-01 ENCOUNTER — Inpatient Hospital Stay: Payer: 59

## 2023-03-03 ENCOUNTER — Inpatient Hospital Stay: Payer: 59 | Attending: Physician Assistant

## 2023-03-03 VITALS — BP 155/97 | HR 78 | Temp 98.7°F | Resp 18

## 2023-03-03 DIAGNOSIS — D500 Iron deficiency anemia secondary to blood loss (chronic): Secondary | ICD-10-CM

## 2023-03-03 DIAGNOSIS — D509 Iron deficiency anemia, unspecified: Secondary | ICD-10-CM | POA: Insufficient documentation

## 2023-03-03 DIAGNOSIS — D751 Secondary polycythemia: Secondary | ICD-10-CM | POA: Insufficient documentation

## 2023-03-03 DIAGNOSIS — E559 Vitamin D deficiency, unspecified: Secondary | ICD-10-CM | POA: Diagnosis not present

## 2023-03-03 DIAGNOSIS — E538 Deficiency of other specified B group vitamins: Secondary | ICD-10-CM | POA: Diagnosis present

## 2023-03-03 MED ORDER — CYANOCOBALAMIN 1000 MCG/ML IJ SOLN
1000.0000 ug | Freq: Once | INTRAMUSCULAR | Status: AC
  Administered 2023-03-03: 1000 ug via INTRAMUSCULAR
  Filled 2023-03-03: qty 1

## 2023-03-03 NOTE — Progress Notes (Signed)
Patient tolerated injection with no complaints voiced. Site clean and dry with no bruising or swelling noted at site. See MAR for details. Band aid applied.  Patient stable during and after injection. VSS with discharge and left in satisfactory condition with no s/s of distress noted.  

## 2023-03-03 NOTE — Patient Instructions (Signed)
[facility]  Discharge Instructions: Thank you for choosing [facility] to provide your oncology and hematology care.  If you have a lab appointment with the Cancer Center - please note that after April 8th, 2024, all labs will be drawn in the cancer center.  You do not have to check in or register with the main entrance as you have in the past but will complete your check-in in the cancer center.  Wear comfortable clothing and clothing appropriate for easy access to any Portacath or PICC line.   We strive to give you quality time with your provider. You may need to reschedule your appointment if you arrive late (15 or more minutes).  Arriving late affects you and other patients whose appointments are after yours.  Also, if you miss three or more appointments without notifying the office, you may be dismissed from the clinic at the provider's discretion.      For prescription refill requests, have your pharmacy contact our office and allow 72 hours for refills to be completed.    Today you received the following B12 injection, return as scheduled.   To help prevent nausea and vomiting after your treatment, we encourage you to take your nausea medication as directed.  BELOW ARE SYMPTOMS THAT SHOULD BE REPORTED IMMEDIATELY: *FEVER GREATER THAN 100.4 F (38 C) OR HIGHER *CHILLS OR SWEATING *NAUSEA AND VOMITING THAT IS NOT CONTROLLED WITH YOUR NAUSEA MEDICATION *UNUSUAL SHORTNESS OF BREATH *UNUSUAL BRUISING OR BLEEDING *URINARY PROBLEMS (pain or burning when urinating, or frequent urination) *BOWEL PROBLEMS (unusual diarrhea, constipation, pain near the anus) TENDERNESS IN MOUTH AND THROAT WITH OR WITHOUT PRESENCE OF ULCERS (sore throat, sores in mouth, or a toothache) UNUSUAL RASH, SWELLING OR PAIN  UNUSUAL VAGINAL DISCHARGE OR ITCHING   Items with * indicate a potential emergency and should be followed up as soon as possible or go to the Emergency  Department if any problems should occur.  Please show the CHEMOTHERAPY ALERT CARD or IMMUNOTHERAPY ALERT CARD at check-in to the Emergency Department and triage nurse.  Should you have questions after your visit or need to cancel or reschedule your appointment, please contact [facility] [phone]  and follow the prompts.  Office hours are 8:00 a.m. to 4:30 p.m. Monday - Friday. Please note that voicemails left after 4:00 p.m. may not be returned until the following business day.  We are closed weekends and major holidays. You have access to a nurse at all times for urgent questions. Please call the main number to the clinic [phone] and follow the prompts.  For any non-urgent questions, you may also contact your provider using MyChart. We now offer e-Visits for anyone 63 and older to request care online for non-urgent symptoms. For details visit [email or web address].   Also download the MyChart app! Go to the app store, search "MyChart", open the app, select [facility], and log in with your MyChart username and password.

## 2023-03-16 ENCOUNTER — Other Ambulatory Visit: Payer: Self-pay

## 2023-03-16 DIAGNOSIS — E559 Vitamin D deficiency, unspecified: Secondary | ICD-10-CM

## 2023-03-17 ENCOUNTER — Inpatient Hospital Stay: Payer: 59

## 2023-03-17 DIAGNOSIS — D500 Iron deficiency anemia secondary to blood loss (chronic): Secondary | ICD-10-CM

## 2023-03-17 DIAGNOSIS — E559 Vitamin D deficiency, unspecified: Secondary | ICD-10-CM

## 2023-03-17 DIAGNOSIS — E538 Deficiency of other specified B group vitamins: Secondary | ICD-10-CM

## 2023-03-17 DIAGNOSIS — Z862 Personal history of diseases of the blood and blood-forming organs and certain disorders involving the immune mechanism: Secondary | ICD-10-CM

## 2023-03-17 LAB — CBC WITH DIFFERENTIAL/PLATELET
Abs Immature Granulocytes: 0.04 10*3/uL (ref 0.00–0.07)
Basophils Absolute: 0.1 10*3/uL (ref 0.0–0.1)
Basophils Relative: 1 %
Eosinophils Absolute: 0.2 10*3/uL (ref 0.0–0.5)
Eosinophils Relative: 2 %
HCT: 43 % (ref 36.0–46.0)
Hemoglobin: 14.4 g/dL (ref 12.0–15.0)
Immature Granulocytes: 1 %
Lymphocytes Relative: 17 %
Lymphs Abs: 1.4 10*3/uL (ref 0.7–4.0)
MCH: 32.4 pg (ref 26.0–34.0)
MCHC: 33.5 g/dL (ref 30.0–36.0)
MCV: 96.8 fL (ref 80.0–100.0)
Monocytes Absolute: 0.5 10*3/uL (ref 0.1–1.0)
Monocytes Relative: 6 %
Neutro Abs: 5.8 10*3/uL (ref 1.7–7.7)
Neutrophils Relative %: 73 %
Platelets: 272 10*3/uL (ref 150–400)
RBC: 4.44 MIL/uL (ref 3.87–5.11)
RDW: 13.2 % (ref 11.5–15.5)
WBC: 7.9 10*3/uL (ref 4.0–10.5)
nRBC: 0 % (ref 0.0–0.2)

## 2023-03-17 LAB — IRON AND TIBC
Iron: 78 ug/dL (ref 28–170)
Saturation Ratios: 19 % (ref 10.4–31.8)
TIBC: 413 ug/dL (ref 250–450)
UIBC: 335 ug/dL

## 2023-03-17 LAB — FERRITIN: Ferritin: 33 ng/mL (ref 11–307)

## 2023-03-17 LAB — VITAMIN D 25 HYDROXY (VIT D DEFICIENCY, FRACTURES): Vit D, 25-Hydroxy: 49.89 ng/mL (ref 30–100)

## 2023-03-17 LAB — VITAMIN B12: Vitamin B-12: 437 pg/mL (ref 180–914)

## 2023-03-19 LAB — ERYTHROPOIETIN: Erythropoietin: 36.3 m[IU]/mL — ABNORMAL HIGH (ref 2.6–18.5)

## 2023-03-22 LAB — METHYLMALONIC ACID, SERUM: Methylmalonic Acid, Quantitative: 166 nmol/L (ref 0–378)

## 2023-03-29 ENCOUNTER — Inpatient Hospital Stay: Payer: 59

## 2023-03-29 ENCOUNTER — Other Ambulatory Visit: Payer: 59

## 2023-03-30 LAB — JAK2 V617F RFX CALR/MPL/E12-15

## 2023-03-30 LAB — CALR +MPL + E12-E15  (REFLEX)

## 2023-03-30 NOTE — Progress Notes (Unsigned)
[facility] [address], [place] [address]   CLINIC:  Medical Oncology/Hematology  PCP:  Ignatius Specking, MD [address] [place] [address] [phone]   REASON FOR VISIT:  Follow-up for iron deficiency anemia and B12 deficiency   CURRENT THERAPY: Intermittent IV iron, B12 injections   INTERVAL HISTORY:  [name] is seen in clinic today for follow-up of iron deficiency anemia and B12 deficiency.  She was last evaluated via telemedicine visit by Rojelio Brenner PA-C on 12/30/2022.  Since her last visit, she was started on vitamin B12 injections.  She reports feeling no improvement energy after B12 repletion.  She continues to have stable fatigue and weakness.   She continues to have symptoms of dizziness, restless legs, headaches, palpitations, and dyspnea on exertion.  She denies any chest pain, lightheadedness, or syncope.   She denies any obvious hematochezia or melena.  She does not take any iron tablet at home.   Regarding her previous erythrocytosis, she denies any history of sleep apnea but reports that she is supposed to be wearing 3 L supplemental oxygen at night, but does not like to use it every night.    She has some general itchiness but denies any aquagenic pruritus.  She continues to have chronic dizziness and headaches.  No other vasomotor symptoms   She reports 40% energy and 100% appetite.  She reports that she is maintaining a stable weight.   ASSESSMENT & PLAN:  1.  Iron deficiency anemia + vitamin B12 deficiency - Patient seen at the request of Dr. Sherril Croon - Labs on 12/01/2021 showed hemoglobin 8.8, MCV 72 and low MCH, MCHC and high RDW.  WBC and PLT are normal.  Iron studies show percent saturation 3, TIBC 446, ferritin 22. - She has a history of receiving parenteral iron therapy, last time in 2017. - Colonoscopy 2 years ago in [place] reportedly normal other than 2 polyps removed.  History of 2 units of blood transfusion. -  She was symptomatic with severe fatigue, which was not improved after her iron per patient report (although patient's grandson/caregiver reports that her energy was much better after IV iron) - Most recent IV Venofer 300 mg x 3 from 04/15/2022 through 04/27/2022  - Denies any bleeding per rectum or melena    - Normal reticulocytes, creatinine, LDH, SPEP (January 2023) - Most recent labs (03/17/2023): Hgb 14.4/MCV 96.8.  Ferritin 33, iron saturation 19%.  Low vitamin B12 158.  Normal folate. - PLAN:  Recommend IV Feraheme x 2 due to severe fatigue with ferritin <100.  2.  Severe Vitamin D deficiency - Labs from 12/24/2022 shows severe vitamin D deficiency at 7.1 - Prescription sent to pharmacy for vitamin D 50,000 units weekly - Most recent labs (03/17/2023) show normal vitamin D49.89 - PLAN: We will switch patient to vitamin D 50,000 units once a month.     3.  Vitamin B12 deficiency - Labs from 12/24/2022 show low vitamin B12 at 158, normal MMA. - She was started on vitamin B12 injection in February 2024 - Most recent labs (03/17/2023) showed normal B12 437, normal MMA 166 - PLAN: Continue monthly B12 injections   4.  Erythrocytosis - Mild erythrocytosis since July 2023 with most recent CBC (12/24/2022): Hgb 16.2/hematocrit 48.9 - She  denies any history of sleep apnea but reports that she is supposed to be wearing 3 L supplemental oxygen at night, but does not like to use it every night. -  Non-smoker.  Not on any diuretic medications. - She has some general itchiness but denies any aquagenic pruritus.  She continues to have chronic dizziness and headaches.   - Hematology workup (03/17/2023): Erythropoietin elevated at 36.3 JAK2 with reflex to CALR and MPL was negative  - Most recent CBC (03/17/2023) shows normalization of hemoglobin 14.4 - PLAN: Patient encouraged to wear oxygen nightly. - Would consider abdominal imaging to rule out any erythropoietin producing tumors of liver or kidneys.   5.   Social/family history: - She lives at home with her grandson.  She worked at [employer] prior to retirement.  She had exposure to some chemicals.  She quit smoking 10 years ago.  She smoked 1 pack/week for 50 years. - Maternal aunt had breast cancer.     PLAN SUMMARY: >> IV Feraheme x 2 >> Monthly B12 injections x 3 months >> Transfer care to [facility] in [place], [place]     ** Patient is requesting TRANSFER OF CARE to her local cancer center in [place], [place].  We will enter referral and send records.  We will plan on giving IV iron and her next few vitamin B12 injections as above, but will not schedule her for any follow-up visits here.  She is aware that she can call us to get back on the schedule if her plans to see the [facility] providers follows through.     REVIEW OF SYSTEMS:   Review of Systems  Constitutional:  Positive for fatigue. Negative for appetite change, chills, diaphoresis, fever and unexpected weight change.  HENT:   Negative for lump/mass and nosebleeds.   Eyes:  Negative for eye problems.  Respiratory:  Positive for cough and shortness of breath. Negative for hemoptysis.   Cardiovascular:  Positive for palpitations. Negative for chest pain and leg swelling.  Gastrointestinal:  Positive for nausea. Negative for abdominal pain, blood in stool, constipation, diarrhea and vomiting.  Genitourinary:  Positive for frequency (and urgency). Negative for hematuria.   Skin: Negative.   Neurological:  Positive for dizziness, headaches and numbness. Negative for light-headedness.  Hematological:  Does not bruise/bleed easily.     PHYSICAL EXAM:  ECOG PERFORMANCE STATUS: 2 - Symptomatic, <50% confined to bed  There were no vitals filed for this visit. There were no vitals filed for this visit. Physical Exam Constitutional:      Appearance: Normal appearance. She is obese.  Cardiovascular:     Heart sounds: Normal heart sounds.  Pulmonary:     Breath  sounds: Normal breath sounds.  Neurological:     General: No focal deficit present.     Mental Status: Mental status is at baseline.  Psychiatric:        Behavior: Behavior normal. Behavior is cooperative.     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   Obesity    Osteoarthritis    Sleep apnea 07/08/2014   Tobacco abuse    Past Surgical History:  Procedure Laterality Date   ABDOMINAL HYSTERECTOMY     GIVENS CAPSULE STUDY N/A 02/19/2013   Procedure: GIVENS CAPSULE STUDY;  Surgeon: Malissa Hippo, MD;  Location: [facility]  ENDO SUITE;  Service: Endoscopy;  Laterality: N/A;  730    SOCIAL HISTORY:  Social History   Socioeconomic History   Marital status: Widowed    Spouse name: Not on file   Number of children: Not on file   Years of education: Not on file   Highest education level: Not on file  Occupational History   Not on file  Tobacco Use   Smoking status: Former    Packs/day: 0.50    Years: 20.00    Additional pack years: 0.00    Total pack years: 10.00    Types: Cigarettes    Quit date: 06/22/2012    Years since quitting: 10.7   Smokeless tobacco: Never  Vaping Use   Vaping Use: Never used  Substance and Sexual Activity   Alcohol use: No   Drug use: No   Sexual activity: Not on file  Other Topics Concern   Not on file  Social History Narrative   Not on file   Social Determinants of Health   Financial Resource Strain: Not on file  Food Insecurity: Not on file  Transportation Needs: Not on file  Physical Activity: Not on file  Stress: Not on file  Social Connections: Not on file  Intimate Partner Violence: Not on file    FAMILY HISTORY:  Family History   Problem Relation Age of Onset   Hypertension Brother     CURRENT MEDICATIONS:  Outpatient Encounter Medications as of 03/31/2023  Medication Sig   acetaminophen (TYLENOL) 500 MG tablet Take 1,000 mg by mouth every 6 (six) hours as needed for mild pain.   BREO ELLIPTA 100-25 MCG/INH AEPB Inhale 1 puff into the lungs daily.    clonazePAM (KLONOPIN) 0.5 MG tablet Take 0.5 mg by mouth daily as needed for anxiety.   diltiazem (CARDIZEM CD) 360 MG 24 hr capsule Take 1 capsule (360 mg total) by mouth daily.   diltiazem (TIAZAC) 360 MG 24 hr capsule Take 360 mg by mouth daily.   ergocalciferol (VITAMIN D2) 1.25 MG (50000 UT) capsule Take 1 capsule (50,000 Units total) by mouth once a week.   ipratropium-albuterol (DUONEB) 0.5-2.5 (3) MG/3ML SOLN Take 3 mLs by nebulization every 4 (four) hours as needed.   lisinopril (ZESTRIL) 20 MG tablet Take 1 tablet by mouth once daily   loratadine (CLARITIN) 10 MG tablet Take 10 mg by mouth daily.   metFORMIN (GLUCOPHAGE) 850 MG tablet Take 850 mg by mouth daily as needed.   metoprolol succinate (TOPROL-XL) 50 MG 24 hr tablet Take 1 tablet (50 mg total) by mouth daily. Take with or immediately following a meal.   OXYGEN Inhale 3 L into the lungs at bedtime.   prednisoLONE acetate (PRED FORTE) 1 % ophthalmic suspension Place 1 drop into the left eye 4 (four) times daily.   tetrahydrozoline-zinc (VISINE-AC) 0.05-0.25 % ophthalmic solution Place 2 drops into both eyes 3 (three) times daily as needed (Red Itchy Eyes).   No facility-administered encounter medications on file as of 03/31/2023.    ALLERGIES:  Allergies  Allergen Reactions   Sulfa Antibiotics Itching and Other (See Comments)    Burning   Shellfish Allergy Hives, Itching and Swelling    LABORATORY DATA:  I have reviewed the labs as listed.  CBC    Component Value Date/Time   WBC 7.9 03/17/2023 1512   RBC 4.44 03/17/2023 1512   HGB 14.4 03/17/2023 1512   HCT 43.0 03/17/2023 1512   PLT 272  03/17/2023 1512  MCV 96.8 03/17/2023 1512   MCH 32.4 03/17/2023 1512   MCHC 33.5 03/17/2023 1512   RDW 13.2 03/17/2023 1512   LYMPHSABS 1.4 03/17/2023 1512   MONOABS 0.5 03/17/2023 1512   EOSABS 0.2 03/17/2023 1512   BASOSABS 0.1 03/17/2023 1512      Latest Ref Rng & Units 12/24/2022    1:11 PM 12/15/2021   11:02 AM 09/23/2021    1:08 PM  CMP  Glucose 70 - 99 mg/dL 161  096  045   BUN 8 - 23 mg/dL 16  15  14    Creatinine 0.44 - 1.00 mg/dL 4.09  8.11  9.14   Sodium 135 - 145 mmol/L 138  137  138   Potassium 3.5 - 5.1 mmol/L 3.8  3.6  4.2   Chloride 98 - 111 mmol/L 106  103  103   CO2 22 - 32 mmol/L 21  23  25    Calcium 8.9 - 10.3 mg/dL 9.2  9.0  9.2   Total Protein 6.5 - 8.1 g/dL 7.3  7.7    Total Bilirubin 0.3 - 1.2 mg/dL 0.9  0.7    Alkaline Phos 38 - 126 U/L 101  134    AST 15 - 41 U/L 18  16    ALT 0 - 44 U/L 9  11      DIAGNOSTIC IMAGING:  I have independently reviewed the relevant imaging and discussed with the patient.   WRAP UP:  All questions were answered. The patient knows to call the clinic with any problems, questions or concerns.  Medical decision making: Moderate  Time spent on visit: I spent 20 minutes counseling the patient face to face. The total time spent in the appointment was 30 minutes and more than 50% was on counseling.  Carnella Guadalajara, PA-C  03/31/23 2:09 PM

## 2023-03-31 ENCOUNTER — Inpatient Hospital Stay (HOSPITAL_BASED_OUTPATIENT_CLINIC_OR_DEPARTMENT_OTHER): Payer: 59 | Admitting: Physician Assistant

## 2023-03-31 ENCOUNTER — Inpatient Hospital Stay: Payer: 59 | Attending: Physician Assistant

## 2023-03-31 VITALS — BP 139/62 | HR 46 | Temp 98.3°F | Resp 18 | Ht 62.0 in | Wt 217.8 lb

## 2023-03-31 DIAGNOSIS — D509 Iron deficiency anemia, unspecified: Secondary | ICD-10-CM | POA: Diagnosis not present

## 2023-03-31 DIAGNOSIS — R5383 Other fatigue: Secondary | ICD-10-CM | POA: Diagnosis not present

## 2023-03-31 DIAGNOSIS — G4730 Sleep apnea, unspecified: Secondary | ICD-10-CM | POA: Insufficient documentation

## 2023-03-31 DIAGNOSIS — D500 Iron deficiency anemia secondary to blood loss (chronic): Secondary | ICD-10-CM

## 2023-03-31 DIAGNOSIS — E559 Vitamin D deficiency, unspecified: Secondary | ICD-10-CM

## 2023-03-31 DIAGNOSIS — R42 Dizziness and giddiness: Secondary | ICD-10-CM | POA: Insufficient documentation

## 2023-03-31 DIAGNOSIS — Z803 Family history of malignant neoplasm of breast: Secondary | ICD-10-CM | POA: Insufficient documentation

## 2023-03-31 DIAGNOSIS — E538 Deficiency of other specified B group vitamins: Secondary | ICD-10-CM | POA: Insufficient documentation

## 2023-03-31 DIAGNOSIS — R002 Palpitations: Secondary | ICD-10-CM | POA: Insufficient documentation

## 2023-03-31 DIAGNOSIS — D751 Secondary polycythemia: Secondary | ICD-10-CM | POA: Insufficient documentation

## 2023-03-31 DIAGNOSIS — R519 Headache, unspecified: Secondary | ICD-10-CM | POA: Diagnosis not present

## 2023-03-31 DIAGNOSIS — Z87891 Personal history of nicotine dependence: Secondary | ICD-10-CM | POA: Diagnosis not present

## 2023-03-31 MED ORDER — CYANOCOBALAMIN 1000 MCG/ML IJ SOLN
1000.0000 ug | Freq: Once | INTRAMUSCULAR | Status: AC
  Administered 2023-03-31: 1000 ug via INTRAMUSCULAR
  Filled 2023-03-31: qty 1

## 2023-03-31 MED ORDER — ERGOCALCIFEROL 1.25 MG (50000 UT) PO CAPS
50000.0000 [IU] | ORAL_CAPSULE | ORAL | 3 refills | Status: DC
Start: 2023-03-31 — End: 2023-06-20

## 2023-03-31 NOTE — Patient Instructions (Signed)
[facility]  Discharge Instructions: Thank you for choosing [facility] to provide your oncology and hematology care.  If you have a lab appointment with the Cancer Center - please note that after April 8th, 2024, all labs will be drawn in the cancer center.  You do not have to check in or register with the main entrance as you have in the past but will complete your check-in in the cancer center.  Wear comfortable clothing and clothing appropriate for easy access to any Portacath or PICC line.   We strive to give you quality time with your provider. You may need to reschedule your appointment if you arrive late (15 or more minutes).  Arriving late affects you and other patients whose appointments are after yours.  Also, if you miss three or more appointments without notifying the office, you may be dismissed from the clinic at the provider's discretion.      For prescription refill requests, have your pharmacy contact our office and allow 72 hours for refills to be completed.    Today you received the following B12 return as scheduled.   To help prevent nausea and vomiting after your treatment, we encourage you to take your nausea medication as directed.  BELOW ARE SYMPTOMS THAT SHOULD BE REPORTED IMMEDIATELY: *FEVER GREATER THAN 100.4 F (38 C) OR HIGHER *CHILLS OR SWEATING *NAUSEA AND VOMITING THAT IS NOT CONTROLLED WITH YOUR NAUSEA MEDICATION *UNUSUAL SHORTNESS OF BREATH *UNUSUAL BRUISING OR BLEEDING *URINARY PROBLEMS (pain or burning when urinating, or frequent urination) *BOWEL PROBLEMS (unusual diarrhea, constipation, pain near the anus) TENDERNESS IN MOUTH AND THROAT WITH OR WITHOUT PRESENCE OF ULCERS (sore throat, sores in mouth, or a toothache) UNUSUAL RASH, SWELLING OR PAIN  UNUSUAL VAGINAL DISCHARGE OR ITCHING   Items with * indicate a potential emergency and should be followed up as soon as possible or go to the Emergency Department if any  problems should occur.  Please show the CHEMOTHERAPY ALERT CARD or IMMUNOTHERAPY ALERT CARD at check-in to the Emergency Department and triage nurse.  Should you have questions after your visit or need to cancel or reschedule your appointment, please contact [facility] [phone]  and follow the prompts.  Office hours are 8:00 a.m. to 4:30 p.m. Monday - Friday. Please note that voicemails left after 4:00 p.m. may not be returned until the following business day.  We are closed weekends and major holidays. You have access to a nurse at all times for urgent questions. Please call the main number to the clinic [phone] and follow the prompts.  For any non-urgent questions, you may also contact your provider using MyChart. We now offer e-Visits for anyone 18 and older to request care online for non-urgent symptoms. For details visit [email or web address].   Also download the MyChart app! Go to the app store, search "MyChart", open the app, select [facility], and log in with your MyChart username and password.   

## 2023-03-31 NOTE — Progress Notes (Signed)
Patient tolerated injection with no complaints voiced. Site clean and dry with no bruising or swelling noted at site. See MAR for details. Band aid applied.  Patient stable during and after injection. VSS with discharge and left in satisfactory condition with no s/s of distress noted.  

## 2023-03-31 NOTE — Patient Instructions (Signed)
[facility] **VISIT SUMMARY & IMPORTANT INSTRUCTIONS **   You were seen today by Rojelio Brenner PA-C for your iron deficiency anemia and other concerns.    IRON DEFICIENCY Will schedule you for IV iron x 2 doses  B12 DEFICIENCY We will schedule you for vitamin B12 injections once a month x 3 months. If you have not been able to get an appointment at your local Cancer Center in [place] after 3 months, please let us know so that we can continue to give B12 injections at our office if needed.  VITAMIN D DEFICIENCY Your vitamin D levels look much better! We will switch your vitamin D supplements that you are taking it ONCE A MONTH (instead of once a week)  TRANSFER OF CARE In order to facilitate easier access to your local oncologist, we will send a referral and copy of your medical records to [facility] in [place], [place]. PHONE: [phone] ADDRESS: [phone] [place], [place] [address] PROVIDERS: Dr. Clarene Reamer and NP Sherrilee Gilles We will still plan on giving you 2 doses of IV iron and we will schedule you for the next 3 months of vitamin B12 injections. If you are not able to get an appointment with the providers at [facility], please call our office so that we can get you scheduled for additional follow-up here at the cancer center in [place], [place].  ** Thank you for trusting me with your healthcare!  I strive to provide all of my patients with quality care at each visit.  If you receive a survey for this visit, I would be so grateful to you for taking the time to provide feedback.  Thank you in advance!  ~ Klohe Lovering                   Dr. Doreatha Massed   &   Rojelio Brenner, PA-C   - - - - - - - - - - - - - - - - - -    Thank you for choosing [facility] at [facility] to provide your oncology and hematology care.  To afford each patient quality time with our  provider, please arrive at least 15 minutes before your scheduled appointment time.   If you have a lab appointment with the Cancer Center please come in thru the Main Entrance and check in at the main information desk.  You need to re-schedule your appointment should you arrive 10 or more minutes late.  We strive to give you quality time with our providers, and arriving late affects you and other patients whose appointments are after yours.  Also, if you no show three or more times for appointments you may be dismissed from the clinic at the providers discretion.     Again, thank you for choosing [facility].  Our hope is that these requests will decrease the amount of time that you wait before being seen by our physicians.       _____________________________________________________________  Should you have questions after your visit to [facility], please contact our office at [phone] and follow the prompts.  Our office hours are 8:00 a.m. and 4:30 p.m. Monday - Friday.  Please note that voicemails left after 4:00 p.m. may not be returned until the following business day.  We are closed weekends and major holidays.  You do have access to a nurse 24-7, just  call the main number to the clinic [phone] and do not press any options, hold on the line and a nurse will answer the phone.    For prescription refill requests, have your pharmacy contact our office and allow 72 hours.

## 2023-04-12 ENCOUNTER — Ambulatory Visit: Payer: 59 | Admitting: Physician Assistant

## 2023-04-12 ENCOUNTER — Inpatient Hospital Stay: Payer: 59

## 2023-04-12 VITALS — BP 129/57 | HR 54 | Temp 98.4°F | Resp 17

## 2023-04-12 DIAGNOSIS — R110 Nausea: Secondary | ICD-10-CM

## 2023-04-12 DIAGNOSIS — E538 Deficiency of other specified B group vitamins: Secondary | ICD-10-CM | POA: Diagnosis not present

## 2023-04-12 DIAGNOSIS — D500 Iron deficiency anemia secondary to blood loss (chronic): Secondary | ICD-10-CM

## 2023-04-12 MED ORDER — CETIRIZINE HCL 10 MG PO TABS
10.0000 mg | ORAL_TABLET | Freq: Once | ORAL | Status: AC
  Administered 2023-04-12: 10 mg via ORAL
  Filled 2023-04-12: qty 1

## 2023-04-12 MED ORDER — ONDANSETRON HCL 4 MG/2ML IJ SOLN
8.0000 mg | Freq: Once | INTRAMUSCULAR | Status: AC
  Administered 2023-04-12: 8 mg via INTRAVENOUS
  Filled 2023-04-12: qty 4

## 2023-04-12 MED ORDER — ACETAMINOPHEN 325 MG PO TABS
650.0000 mg | ORAL_TABLET | Freq: Once | ORAL | Status: AC
  Administered 2023-04-12: 650 mg via ORAL
  Filled 2023-04-12: qty 2

## 2023-04-12 MED ORDER — SODIUM CHLORIDE 0.9 % IV SOLN
510.0000 mg | Freq: Once | INTRAVENOUS | Status: AC
  Administered 2023-04-12: 510 mg via INTRAVENOUS
  Filled 2023-04-12: qty 510

## 2023-04-12 MED ORDER — SODIUM CHLORIDE 0.9 % IV SOLN: Freq: Once | INTRAVENOUS | Status: AC

## 2023-04-12 NOTE — Patient Instructions (Signed)
[facility]  Discharge Instructions: Thank you for choosing [facility] to provide your oncology and hematology care.  If you have a lab appointment with the Cancer Center - please note that after April 8th, 2024, all labs will be drawn in the cancer center.  You do not have to check in or register with the main entrance as you have in the past but will complete your check-in in the cancer center.  Wear comfortable clothing and clothing appropriate for easy access to any Portacath or PICC line.   We strive to give you quality time with your provider. You may need to reschedule your appointment if you arrive late (15 or more minutes).  Arriving late affects you and other patients whose appointments are after yours.  Also, if you miss three or more appointments without notifying the office, you may be dismissed from the clinic at the provider's discretion.      For prescription refill requests, have your pharmacy contact our office and allow 72 hours for refills to be completed.    Today you received the following: Feraheme.  Ferumoxytol Injection What is this medication? FERUMOXYTOL (FER ue MOX i tol) treats low levels of iron in your body (iron deficiency anemia). Iron is a mineral that plays an important role in making red blood cells, which carry oxygen from your lungs to the rest of your body. This medicine may be used for other purposes; ask your health care provider or pharmacist if you have questions. COMMON BRAND NAME(S): Feraheme What should I tell my care team before I take this medication? They need to know if you have any of these conditions: Anemia not caused by low iron levels High levels of iron in the blood Magnetic resonance imaging (MRI) test scheduled An unusual or allergic reaction to iron, other medications, foods, dyes, or preservatives Pregnant or trying to get pregnant Breastfeeding How should I use this medication? This medication  is injected into a vein. It is given by your care team in a hospital or clinic setting. Talk to your care team the use of this medication in children. Special care may be needed. Overdosage: If you think you have taken too much of this medicine contact a poison control center or emergency room at once. NOTE: This medicine is only for you. Do not share this medicine with others. What if I miss a dose? It is important not to miss your dose. Call your care team if you are unable to keep an appointment. What may interact with this medication? Other iron products This list may not describe all possible interactions. Give your health care provider a list of all the medicines, herbs, non-prescription drugs, or dietary supplements you use. Also tell them if you smoke, drink alcohol, or use illegal drugs. Some items may interact with your medicine. What should I watch for while using this medication? Visit your care team regularly. Tell your care team if your symptoms do not start to get better or if they get worse. You may need blood work done while you are taking this medication. You may need to follow a special diet. Talk to your care team. Foods that contain iron include: whole grains/cereals, dried fruits, beans, or peas, leafy green vegetables, and organ meats (liver, kidney). What side effects may I notice from receiving this medication? Side effects that you should report to your care team as soon as possible: Allergic reactions--skin rash, itching, hives, swelling of the face,   lips, tongue, or throat Low blood pressure--dizziness, feeling faint or lightheaded, blurry vision Shortness of breath Side effects that usually do not require medical attention (report to your care team if they continue or are bothersome): Flushing Headache Joint pain Muscle pain Nausea Pain, redness, or irritation at injection site This list may not describe all possible side effects. Call your doctor for medical  advice about side effects. You may report side effects to FDA at 1-800-FDA-1088. Where should I keep my medication? This medication is given in a hospital or clinic and will not be stored at home. NOTE: This sheet is a summary. It may not cover all possible information. If you have questions about this medicine, talk to your doctor, pharmacist, or health care provider.  2023 Elsevier/Gold Standard (2021-04-01 00:00:00)       To help prevent nausea and vomiting after your treatment, we encourage you to take your nausea medication as directed.  BELOW ARE SYMPTOMS THAT SHOULD BE REPORTED IMMEDIATELY: *FEVER GREATER THAN 100.4 F (38 C) OR HIGHER *CHILLS OR SWEATING *NAUSEA AND VOMITING THAT IS NOT CONTROLLED WITH YOUR NAUSEA MEDICATION *UNUSUAL SHORTNESS OF BREATH *UNUSUAL BRUISING OR BLEEDING *URINARY PROBLEMS (pain or burning when urinating, or frequent urination) *BOWEL PROBLEMS (unusual diarrhea, constipation, pain near the anus) TENDERNESS IN MOUTH AND THROAT WITH OR WITHOUT PRESENCE OF ULCERS (sore throat, sores in mouth, or a toothache) UNUSUAL RASH, SWELLING OR PAIN  UNUSUAL VAGINAL DISCHARGE OR ITCHING   Items with * indicate a potential emergency and should be followed up as soon as possible or go to the Emergency Department if any problems should occur.  Please show the CHEMOTHERAPY ALERT CARD or IMMUNOTHERAPY ALERT CARD at check-in to the Emergency Department and triage nurse.  Should you have questions after your visit or need to cancel or reschedule your appointment, please contact [facility] [phone]  and follow the prompts.  Office hours are 8:00 a.m. to 4:30 p.m. Monday - Friday. Please note that voicemails left after 4:00 p.m. may not be returned until the following business day.  We are closed weekends and major holidays. You have access to a nurse at all times for urgent questions. Please call the main number to the clinic [phone] and follow  the prompts.  For any non-urgent questions, you may also contact your provider using MyChart. We now offer e-Visits for anyone 18 and older to request care online for non-urgent symptoms. For details visit [email or web address].   Also download the MyChart app! Go to the app store, search "MyChart", open the app, select [place], and log in with your MyChart username and password.   

## 2023-04-12 NOTE — Progress Notes (Signed)
Patient presents today for iron infusion of Feraheme.  Patient is in satisfactory condition with exception of reporting some nausea earlier.  Vital signs are stable.  We will proceed with infusion per provider orders.  Patient c/o nausea again, infusion stopped and Rojelio Brenner PA aware. Patient given Zofran 8mg  IV per order. Patient also asked about something for dizziness which she has had prior to infusion and Rojelio Brenner PA is also aware of and patient had complained in previous appointment. Patient instructed to follow-up with PCP and to wear her oxygen at night when she is asleep. Per PA order. Patient verbalized understanding.   Patient reported improvement in nausea after Zofran, Feraheme restarted and patient tolerating well.   Patient tolerated treatment well after Zofran given with no complaints voiced.  Patient left via wheelchair with family in stable condition.  Vital signs stable at discharge.  Follow up as scheduled.

## 2023-04-12 NOTE — Progress Notes (Deleted)
Patient presents today for iron infusion.  Patient is in satisfactory condition with no new complaints voiced.  IV placed in R hand.  IV flushed well with good blood return noted.  Vital signs are stable.  We will proceed with infusion per provider orders.

## 2023-04-13 ENCOUNTER — Ambulatory Visit: Payer: 59 | Admitting: Cardiology

## 2023-04-21 ENCOUNTER — Inpatient Hospital Stay: Payer: 59

## 2023-04-26 ENCOUNTER — Inpatient Hospital Stay: Payer: 59

## 2023-05-03 ENCOUNTER — Inpatient Hospital Stay: Payer: 59

## 2023-05-11 ENCOUNTER — Inpatient Hospital Stay: Payer: 59 | Attending: Physician Assistant

## 2023-05-11 VITALS — BP 126/86 | HR 50 | Temp 98.4°F | Resp 18

## 2023-05-11 DIAGNOSIS — E538 Deficiency of other specified B group vitamins: Secondary | ICD-10-CM | POA: Insufficient documentation

## 2023-05-11 DIAGNOSIS — D509 Iron deficiency anemia, unspecified: Secondary | ICD-10-CM | POA: Diagnosis present

## 2023-05-11 DIAGNOSIS — D500 Iron deficiency anemia secondary to blood loss (chronic): Secondary | ICD-10-CM

## 2023-05-11 MED ORDER — SODIUM CHLORIDE 0.9 % IV SOLN: Freq: Once | INTRAVENOUS | Status: AC

## 2023-05-11 MED ORDER — SODIUM CHLORIDE 0.9 % IV SOLN
510.0000 mg | Freq: Once | INTRAVENOUS | Status: AC
  Administered 2023-05-11: 510 mg via INTRAVENOUS
  Filled 2023-05-11: qty 510

## 2023-05-11 MED ORDER — ACETAMINOPHEN 325 MG PO TABS
650.0000 mg | ORAL_TABLET | Freq: Once | ORAL | Status: AC
  Administered 2023-05-11: 650 mg via ORAL
  Filled 2023-05-11: qty 2

## 2023-05-11 MED ORDER — CYANOCOBALAMIN 1000 MCG/ML IJ SOLN
1000.0000 ug | Freq: Once | INTRAMUSCULAR | Status: AC
  Administered 2023-05-11: 1000 ug via INTRAMUSCULAR
  Filled 2023-05-11: qty 1

## 2023-05-11 MED ORDER — CETIRIZINE HCL 10 MG PO TABS
10.0000 mg | ORAL_TABLET | Freq: Once | ORAL | Status: AC
  Administered 2023-05-11: 10 mg via ORAL
  Filled 2023-05-11: qty 1

## 2023-05-11 NOTE — Patient Instructions (Signed)
[facility]  Discharge Instructions: Thank you for choosing [facility] to provide your oncology and hematology care.  If you have a lab appointment with the Cancer Center - please note that after April 8th, 2024, all labs will be drawn in the cancer center.  You do not have to check in or register with the main entrance as you have in the past but will complete your check-in in the cancer center.  Wear comfortable clothing and clothing appropriate for easy access to any Portacath or PICC line.   We strive to give you quality time with your provider. You may need to reschedule your appointment if you arrive late (15 or more minutes).  Arriving late affects you and other patients whose appointments are after yours.  Also, if you miss three or more appointments without notifying the office, you may be dismissed from the clinic at the provider's discretion.      For prescription refill requests, have your pharmacy contact our office and allow 72 hours for refills to be completed.    Vitamin B12 Injection What is this medication? Vitamin B12 (VAHY tuh min B12) prevents and treats low vitamin B12 levels in your body. It is used in people who do not get enough vitamin B12 from their diet or when their digestive tract does not absorb enough. Vitamin B12 plays an important role in maintaining the health of your nervous system and red blood cells. This medicine may be used for other purposes; ask your health care provider or pharmacist if you have questions. COMMON BRAND NAME(S): B-12 Compliance Kit, B-12 Injection Kit, Cyomin, Dodex, LA-12, Nutri-Twelve, Physicians EZ Use B-12, Primabalt What should I tell my care team before I take this medication? They need to know if you have any of these conditions: Kidney disease Leber's disease Megaloblastic anemia An unusual or allergic reaction to cyanocobalamin, cobalt, other medications, foods, dyes, or  preservatives Pregnant or trying to get pregnant Breast-feeding How should I use this medication? This medication is injected into a muscle or deeply under the skin. It is usually given in a clinic or care team's office. However, your care team may teach you how to inject yourself. Follow all instructions. Talk to your care team about the use of this medication in children. Special care may be needed. Overdosage: If you think you have taken too much of this medicine contact a poison control center or emergency room at once. NOTE: This medicine is only for you. Do not share this medicine with others. What if I miss a dose? If you are given your dose at a clinic or care team's office, call to reschedule your appointment. If you give your own injections, and you miss a dose, take it as soon as you can. If it is almost time for your next dose, take only that dose. Do not take double or extra doses. What may interact with this medication? Alcohol Colchicine This list may not describe all possible interactions. Give your health care provider a list of all the medicines, herbs, non-prescription drugs, or dietary supplements you use. Also tell them if you smoke, drink alcohol, or use illegal drugs. Some items may interact with your medicine. What should I watch for while using this medication? Visit your care team regularly. You may need blood work done while you are taking this medication. You may need to follow a special diet. Talk to your care team. Limit your alcohol intake and avoid smoking to  get the best benefit. What side effects may I notice from receiving this medication? Side effects that you should report to your care team as soon as possible: Allergic reactions--skin rash, itching, hives, swelling of the face, lips, tongue, or throat Swelling of the ankles, hands, or feet Trouble breathing Side effects that usually do not require medical attention (report to your care team if they continue  or are bothersome): Diarrhea This list may not describe all possible side effects. Call your doctor for medical advice about side effects. You may report side effects to FDA at 1-800-FDA-1088. Where should I keep my medication? Keep out of the reach of children. Store at room temperature between 15 and 30 degrees C (59 and 85 degrees F). Protect from light. Throw away any unused medication after the expiration date. NOTE: This sheet is a summary. It may not cover all possible information. If you have questions about this medicine, talk to your doctor, pharmacist, or health care provider.  2024 Elsevier/Gold Standard (2021-07-21 00:00:00)    Ferumoxytol Injection What is this medication? FERUMOXYTOL (FER ue MOX i tol) treats low levels of iron in your body (iron deficiency anemia). Iron is a mineral that plays an important role in making red blood cells, which carry oxygen from your lungs to the rest of your body. This medicine may be used for other purposes; ask your health care provider or pharmacist if you have questions. COMMON BRAND NAME(S): Feraheme What should I tell my care team before I take this medication? They need to know if you have any of these conditions: Anemia not caused by low iron levels High levels of iron in the blood Magnetic resonance imaging (MRI) test scheduled An unusual or allergic reaction to iron, other medications, foods, dyes, or preservatives Pregnant or trying to get pregnant Breastfeeding How should I use this medication? This medication is injected into a vein. It is given by your care team in a hospital or clinic setting. Talk to your care team the use of this medication in children. Special care may be needed. Overdosage: If you think you have taken too much of this medicine contact a poison control center or emergency room at once. NOTE: This medicine is only for you. Do not share this medicine with others. What if I miss a dose? It is important  not to miss your dose. Call your care team if you are unable to keep an appointment. What may interact with this medication? Other iron products This list may not describe all possible interactions. Give your health care provider a list of all the medicines, herbs, non-prescription drugs, or dietary supplements you use. Also tell them if you smoke, drink alcohol, or use illegal drugs. Some items may interact with your medicine. What should I watch for while using this medication? Visit your care team regularly. Tell your care team if your symptoms do not start to get better or if they get worse. You may need blood work done while you are taking this medication. You may need to follow a special diet. Talk to your care team. Foods that contain iron include: whole grains/cereals, dried fruits, beans, or peas, leafy green vegetables, and organ meats (liver, kidney). What side effects may I notice from receiving this medication? Side effects that you should report to your care team as soon as possible: Allergic reactions--skin rash, itching, hives, swelling of the face, lips, tongue, or throat Low blood pressure--dizziness, feeling faint or lightheaded, blurry vision Shortness of breath Side  effects that usually do not require medical attention (report to your care team if they continue or are bothersome): Flushing Headache Joint pain Muscle pain Nausea Pain, redness, or irritation at injection site This list may not describe all possible side effects. Call your doctor for medical advice about side effects. You may report side effects to FDA at 1-800-FDA-1088. Where should I keep my medication? This medication is given in a hospital or clinic and will not be stored at home. NOTE: This sheet is a summary. It may not cover all possible information. If you have questions about this medicine, talk to your doctor, pharmacist, or health care provider.  2024 Elsevier/Gold Standard (2022-05-17  00:00:00)    To help prevent nausea and vomiting after your treatment, we encourage you to take your nausea medication as directed.  BELOW ARE SYMPTOMS THAT SHOULD BE REPORTED IMMEDIATELY: *FEVER GREATER THAN 100.4 F (38 C) OR HIGHER *CHILLS OR SWEATING *NAUSEA AND VOMITING THAT IS NOT CONTROLLED WITH YOUR NAUSEA MEDICATION *UNUSUAL SHORTNESS OF BREATH *UNUSUAL BRUISING OR BLEEDING *URINARY PROBLEMS (pain or burning when urinating, or frequent urination) *BOWEL PROBLEMS (unusual diarrhea, constipation, pain near the anus) TENDERNESS IN MOUTH AND THROAT WITH OR WITHOUT PRESENCE OF ULCERS (sore throat, sores in mouth, or a toothache) UNUSUAL RASH, SWELLING OR PAIN  UNUSUAL VAGINAL DISCHARGE OR ITCHING   Items with * indicate a potential emergency and should be followed up as soon as possible or go to the Emergency Department if any problems should occur.  Please show the CHEMOTHERAPY ALERT CARD or IMMUNOTHERAPY ALERT CARD at check-in to the Emergency Department and triage nurse.  Should you have questions after your visit or need to cancel or reschedule your appointment, please contact [facility] [phone]  and follow the prompts.  Office hours are 8:00 a.m. to 4:30 p.m. Monday - Friday. Please note that voicemails left after 4:00 p.m. may not be returned until the following business day.  We are closed weekends and major holidays. You have access to a nurse at all times for urgent questions. Please call the main number to the clinic [phone] and follow the prompts.  For any non-urgent questions, you may also contact your provider using MyChart. We now offer e-Visits for anyone 45 and older to request care online for non-urgent symptoms. For details visit [email or web address].   Also download the MyChart app! Go to the app store, search "MyChart", open the app, select [facility], and log in with your MyChart username and password.

## 2023-05-11 NOTE — Progress Notes (Signed)
Patient presents today for iron infusion.  Patient is in satisfactory condition with no new complaints voiced.  Vital signs are stable.  IV placed in L hand.  IV flushed well with good blood return noted. We will proceed with infusion per provider orders.    Patient tolerated Vitamin B12 injection with no complaints voiced.  Site clean and dry with no bruising or swelling noted.  No complaints of pain.    Patient tolerated treatment well with no complaints voiced.  Patient left via wheelchair in stable condition.  Vital signs stable at discharge.  Follow up as scheduled.

## 2023-05-16 ENCOUNTER — Ambulatory Visit: Payer: 59 | Attending: Cardiology | Admitting: Nurse Practitioner

## 2023-05-16 ENCOUNTER — Encounter: Payer: Self-pay | Admitting: Nurse Practitioner

## 2023-05-16 VITALS — BP 148/86 | HR 83 | Ht 62.5 in | Wt 205.6 lb

## 2023-05-16 DIAGNOSIS — I10 Essential (primary) hypertension: Secondary | ICD-10-CM

## 2023-05-16 DIAGNOSIS — J449 Chronic obstructive pulmonary disease, unspecified: Secondary | ICD-10-CM | POA: Diagnosis not present

## 2023-05-16 DIAGNOSIS — I4821 Permanent atrial fibrillation: Secondary | ICD-10-CM | POA: Diagnosis not present

## 2023-05-16 DIAGNOSIS — R0609 Other forms of dyspnea: Secondary | ICD-10-CM

## 2023-05-16 DIAGNOSIS — R002 Palpitations: Secondary | ICD-10-CM | POA: Diagnosis not present

## 2023-05-16 DIAGNOSIS — R42 Dizziness and giddiness: Secondary | ICD-10-CM

## 2023-05-16 DIAGNOSIS — R531 Weakness: Secondary | ICD-10-CM

## 2023-05-16 NOTE — Progress Notes (Signed)
Office Visit    Patient Name: Andrea [name] Date of Encounter: 05/16/2023  PCP:  Ignatius Specking, MD   [facility]  Cardiologist:  Dina Rich, MD  Advanced Practice Provider:  No care team member to display Electrophysiologist:  None   Chief Complaint    Andrea [name] is a 82 y.o. female with a hx of permanent A-fib, IDA d/t chronic blood loss (followed by Hematology), HTN, COPD, and OSA, who presents today for 6 month follow-up.   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 female with a PMH as mentioned above.   Last seen by Dr. Dina Rich on September 30, 2022. Was noted that she was not on AC d/t hx of GI bleeding, not a candidate for Watchman. Noted hx of noncompliance with CPAP machine. Was noted that pt had elevated HR and palpitations, but was noncompliant with medications. Lopressor changed to Toprol XL 50 mg daily.   Today she presents for 6 month follow-up. She endorses DOE, stable. Admits to ongoing dizziness, denies any vertigo symptoms, brief palpitations (< 1 minute in duration). BP well  controlled at home but admits to poor hydration status. Denies any chest pain, syncope, presyncope, orthopnea, PND, swelling or significant weight changes, acute bleeding, or claudication. Does admit to weakness/ difficulty with walking.  EKGs/Labs/Other Studies Reviewed:   The following studies were reviewed today:   EKG:  EKG is not ordered today.    Echo 07/2021:  1. Left ventricular ejection fraction, by estimation, is 55 to 60%. The  left ventricle has normal function. The left ventricle has no regional  wall motion abnormalities. There is moderate left ventricular hypertrophy.  Left ventricular diastolic  parameters are indeterminate. Elevated left atrial pressure.   2. Right ventricular systolic function is normal. The right ventricular  size is normal. There is mildly elevated pulmonary artery systolic  pressure.   3. Left atrial size was moderately dilated.   4. A small pericardial effusion is present. The pericardial effusion is  posterior to the left ventricle.   5. The mitral valve is grossly normal. Mild mitral valve regurgitation.   6. The aortic valve is tricuspid. There is mild calcification of the  aortic valve. Aortic valve regurgitation is mild. No aortic stenosis is  present.   7. The inferior vena cava is normal in size with greater than 50%  respiratory variability, suggesting right atrial pressure of 3 mmHg.   Comparison(s): Prior  images reviewed side by side. No significant change  in LVEF. Small posterior pericardial effusion present previously.  Recent Labs: 12/24/2022: ALT 9; BUN 16; Creatinine, Ser 0.84; Potassium 3.8; Sodium 138 03/17/2023: Hemoglobin 14.4; Platelets 272  Recent Lipid Panel No results found for: "CHOL", "TRIG", "HDL", "CHOLHDL", "VLDL", "LDLCALC", "LDLDIRECT"   Home Medications   Current Meds  Medication Sig   acetaminophen (TYLENOL) 500 MG tablet Take 1,000 mg by mouth every 6 (six) hours as needed for mild pain.   BREO ELLIPTA  100-25 MCG/INH AEPB Inhale 1 puff into the lungs daily.    clonazePAM (KLONOPIN) 0.5 MG tablet Take 0.5 mg by mouth daily as needed for anxiety.   diltiazem (TIAZAC) 360 MG 24 hr capsule Take 360 mg by mouth daily.   ergocalciferol (VITAMIN D2) 1.25 MG (50000 UT) capsule Take 1 capsule (50,000 Units total) by mouth every 30 (thirty) days.   ipratropium-albuterol (DUONEB) 0.5-2.5 (3) MG/3ML SOLN Take 3 mLs by nebulization every 4 (four) hours as needed.   lisinopril (ZESTRIL) 20 MG tablet Take 1 tablet by mouth once daily   loratadine (CLARITIN) 10 MG tablet Take 10 mg by mouth daily.   metoprolol succinate (TOPROL-XL) 50 MG 24 hr tablet Take 1 tablet (50 mg total) by mouth daily. Take with or immediately following a meal.   OXYGEN Inhale 3 L into the lungs at bedtime.   prednisoLONE acetate (PRED FORTE) 1 % ophthalmic suspension Place 1 drop into the left eye 4 (four) times daily.   tetrahydrozoline-zinc (VISINE-AC) 0.05-0.25 % ophthalmic solution Place 2 drops into both eyes 3 (three) times daily as needed (Red Itchy Eyes).   diltiazem (CARDIZEM CD) 360 MG 24 hr capsule Take 1 capsule (360 mg total) by mouth daily.     Review of Systems    All other systems reviewed and are otherwise negative except as noted above.  Physical Exam    VS:  BP (!) 148/86   Pulse 83   Ht 5' 2.5" (1.588 m)   Wt 205 lb 9.6 oz (93.3 kg)   SpO2 96%   BMI 37.01 kg/m  , BMI Body mass index is 37.01 kg/m.  Wt Readings from Last 3 Encounters:  05/16/23 205 lb 9.6 oz (93.3 kg)  03/31/23 217 lb 12.8 oz (98.8 kg)  09/30/22 213 lb 3.2 oz (96.7 kg)     GEN: Obese, 82 y.o. female in no acute distress. HEENT: normal. Neck: Supple, no JVD, carotid bruits, or masses. Cardiac: S1/S2, irregular rhythm and regular rate, no murmurs, rubs, or gallops. No clubbing, cyanosis, edema.  Radials/PT 2+ and equal bilaterally.  Respiratory:  Respirations regular and unlabored, clear to auscultation bilaterally. GI: Soft,  nontender, nondistended. MS: No deformity or atrophy. Skin: Warm and dry, no rash. Neuro:  Strength and sensation are intact. Psych: Normal affect.  Assessment & Plan    Permanent A-fib, Palpitations Admits to brief, transient palpitations with ambulation. Discussed titration of metoprolol, pt declines at this time. Not on AC d/t past hx of GI bleed.  No medication changes at this time. Heart healthy diet and regular cardiovascular exercise encouraged. ED precautions discussed.  COPD, DOE  Does not see a pulmonologist. Recommended she discuss her symptoms with PCP to refer her to pulmonologist near [place]. Past Lexiscan appears normal. Last Echo in 2022 showed normal EF. ED precautions discussed.    HTN BP mildly elevated, repeat BP 145/91. BP well controlled at home. Discussed to monitor BP at home at least 2 hours after  medications and sitting for 5-10 minutes. No medication changes at this time. Discussed SBP goal < 140 and to notify office if it remains consistently elevated. Heart healthy diet and regular cardiovascular exercise encouraged.   4. Dizziness/Weakness Does not sound related to orthostasis/vertigo. Recommended adequate hydration, compression stockings, and changing positions slowly. Recommended to discuss with her PCP, may need referral to Spine MD and/or PT/OT. ED precautions discussed.   Disposition: Follow up in 6 month(s) with Dina Rich, MD or APP.  Signed, Sharlene Dory, NP 05/16/2023, 12:37 PM [facility]

## 2023-05-16 NOTE — Patient Instructions (Addendum)
Medication Instructions:  Your physician recommends that you continue on your current medications as directed. Please refer to the Current Medication list given to you today.  Labwork: none  Testing/Procedures: none  Follow-Up: Your physician recommends that you schedule a follow-up appointment in: 6 Months with Branch or Philis Nettle  Any Other Special Instructions Will Be Listed Below (If Applicable).    If you need a refill on your cardiac medications before your next appointment, please call your pharmacy.

## 2023-06-02 ENCOUNTER — Inpatient Hospital Stay: Payer: 59 | Attending: Physician Assistant

## 2023-06-20 ENCOUNTER — Other Ambulatory Visit: Payer: Self-pay | Admitting: Physician Assistant

## 2023-06-20 DIAGNOSIS — E559 Vitamin D deficiency, unspecified: Secondary | ICD-10-CM

## 2023-08-22 ENCOUNTER — Other Ambulatory Visit: Payer: Self-pay | Admitting: Cardiology

## 2023-09-28 ENCOUNTER — Other Ambulatory Visit: Payer: Self-pay | Admitting: Hematology

## 2023-09-28 DIAGNOSIS — E559 Vitamin D deficiency, unspecified: Secondary | ICD-10-CM

## 2023-10-09 ENCOUNTER — Encounter: Payer: Self-pay | Admitting: Hematology

## 2023-11-01 ENCOUNTER — Encounter: Payer: Self-pay | Admitting: Hematology

## 2023-11-08 ENCOUNTER — Ambulatory Visit: Payer: 59 | Admitting: Nurse Practitioner

## 2023-12-19 ENCOUNTER — Encounter: Payer: Self-pay | Admitting: Nurse Practitioner

## 2023-12-19 ENCOUNTER — Ambulatory Visit: Payer: Medicare Other | Attending: Nurse Practitioner | Admitting: Nurse Practitioner

## 2023-12-19 NOTE — Progress Notes (Deleted)
Office Visit    Patient Name: [name] Date of Encounter: 12/19/2023  PCP:  Ignatius Specking, MD   [facility]  Cardiologist:  Dina Rich, MD  Advanced Practice Provider:  No care team member to display Electrophysiologist:  None   Chief Complaint    Andrea [name] is a 83 y.o. female with a hx of permanent A-fib, IDA d/t chronic blood loss (followed by Hematology), HTN, COPD, and OSA, who presents today for 6 month follow-up.   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 female with a PMH as mentioned above.   Last seen by Dr. Dina Rich on September 30, 2022. Was noted that she was not on AC d/t hx of GI bleeding, not a candidate for Watchman. Noted hx of noncompliance with CPAP machine. Was noted that pt had elevated HR and palpitations, but was noncompliant with medications. Lopressor changed to Toprol XL 50 mg daily.   Today she presents for 6 month follow-up. She endorses DOE, stable. Admits to ongoing dizziness, denies any vertigo symptoms, brief palpitations (< 1 minute in duration). BP well  controlled at home but admits to poor hydration status. Denies any chest pain, syncope, presyncope, orthopnea, PND, swelling or significant weight changes, acute bleeding, or claudication. Does admit to weakness/ difficulty with walking.  EKGs/Labs/Other Studies Reviewed:   The following studies were reviewed today:   EKG:  EKG is not ordered today.    Echo 07/2021:  1. Left ventricular ejection fraction, by estimation, is 55 to 60%. The  left ventricle has normal function. The left ventricle has no regional  wall motion abnormalities. There is moderate left ventricular hypertrophy.  Left ventricular diastolic  parameters are indeterminate. Elevated left atrial pressure.   2. Right ventricular systolic function is normal. The right ventricular  size is normal. There is mildly elevated pulmonary artery systolic  pressure.   3. Left atrial size was moderately dilated.   4. A small pericardial effusion is present. The pericardial effusion is  posterior to the left ventricle.   5. The mitral valve is grossly normal. Mild mitral valve regurgitation.   6. The aortic valve is tricuspid. There is mild calcification of the  aortic valve. Aortic valve regurgitation is mild. No aortic stenosis is  present.   7. The inferior vena cava is normal in size with greater than 50%  respiratory variability, suggesting right atrial pressure of 3 mmHg.   Comparison(s): Prior  images reviewed side by side. No significant change  in LVEF. Small posterior pericardial effusion present previously.  Recent Labs: 12/24/2022: ALT 9; BUN 16; Creatinine, Ser 0.84; Potassium 3.8; Sodium 138 03/17/2023: Hemoglobin 14.4; Platelets 272  Recent Lipid Panel No results found for: "CHOL", "TRIG", "HDL", "CHOLHDL", "VLDL", "LDLCALC", "LDLDIRECT"   Home Medications   Current Meds  Medication Sig   acetaminophen (TYLENOL) 500 MG tablet Take 1,000 mg by mouth every 6 (six) hours as needed for mild pain.   BREO ELLIPTA  100-25 MCG/INH AEPB Inhale 1 puff into the lungs daily.    clonazePAM (KLONOPIN) 0.5 MG tablet Take 0.5 mg by mouth daily as needed for anxiety.   diltiazem (TIAZAC) 360 MG 24 hr capsule Take 360 mg by mouth daily.   ergocalciferol (VITAMIN D2) 1.25 MG (50000 UT) capsule Take 1 capsule (50,000 Units total) by mouth every 30 (thirty) days.   ipratropium-albuterol (DUONEB) 0.5-2.5 (3) MG/3ML SOLN Take 3 mLs by nebulization every 4 (four) hours as needed.   lisinopril (ZESTRIL) 20 MG tablet Take 1 tablet by mouth once daily   loratadine (CLARITIN) 10 MG tablet Take 10 mg by mouth daily.   metoprolol succinate (TOPROL-XL) 50 MG 24 hr tablet Take 1 tablet (50 mg total) by mouth daily. Take with or immediately following a meal.   OXYGEN Inhale 3 L into the lungs at bedtime.   prednisoLONE acetate (PRED FORTE) 1 % ophthalmic suspension Place 1 drop into the left eye 4 (four) times daily.   tetrahydrozoline-zinc (VISINE-AC) 0.05-0.25 % ophthalmic solution Place 2 drops into both eyes 3 (three) times daily as needed (Red Itchy Eyes).   diltiazem (CARDIZEM CD) 360 MG 24 hr capsule Take 1 capsule (360 mg total) by mouth daily.     Review of Systems    All other systems reviewed and are otherwise negative except as noted above.  Physical Exam    VS:  There were no vitals taken for this visit. , BMI There is no height or weight on file to calculate BMI.  Wt Readings from Last 3 Encounters:  05/16/23 205 lb 9.6 oz (93.3 kg)  03/31/23 217 lb 12.8 oz (98.8 kg)  09/30/22 213 lb 3.2 oz (96.7 kg)     GEN: Obese, 83 y.o. female in no acute distress. HEENT: normal. Neck: Supple, no JVD, carotid bruits, or masses. Cardiac: S1/S2, irregular rhythm and regular rate, no murmurs, rubs, or gallops. No clubbing, cyanosis, edema.  Radials/PT 2+ and equal bilaterally.  Respiratory:  Respirations regular and unlabored, clear to auscultation bilaterally. GI: Soft, nontender, nondistended. MS: No deformity or  atrophy. Skin: Warm and dry, no rash. Neuro:  Strength and sensation are intact. Psych: Normal affect.  Assessment & Plan    Permanent A-fib, Palpitations Admits to brief, transient palpitations with ambulation. Discussed titration of metoprolol, pt declines at this time. Not on AC d/t past hx of GI bleed.  No medication changes at this time. Heart healthy diet and regular cardiovascular exercise encouraged. ED precautions discussed.  COPD, DOE  Does not see a pulmonologist. Recommended she discuss her symptoms with PCP to refer her to pulmonologist near [place]. Past Lexiscan appears normal. Last Echo in 2022 showed normal EF. ED precautions discussed.    HTN BP mildly elevated, repeat BP 145/91. BP well controlled at home. Discussed to monitor BP at home at least 2 hours after medications and sitting for 5-10 minutes. No medication changes at this time. Discussed SBP goal < 140 and to notify  office if it remains consistently elevated. Heart healthy diet and regular cardiovascular exercise encouraged.   4. Dizziness/Weakness Does not sound related to orthostasis/vertigo. Recommended adequate hydration, compression stockings, and changing positions slowly. Recommended to discuss with her PCP, may need referral to Spine MD and/or PT/OT. ED precautions discussed.   Disposition: Follow up in 6 month(s) with Dina Rich, MD or APP.  Signed, Sharlene Dory, NP 12/19/2023, 8:13 AM [facility]

## 2023-12-27 ENCOUNTER — Telehealth: Payer: Self-pay | Admitting: Cardiology

## 2023-12-27 NOTE — Telephone Encounter (Deleted)
 Andrea [name] was seen last Monday on 12/19/2023

## 2023-12-28 NOTE — Telephone Encounter (Signed)
 Called [facility] to get her scheduled. She will come in on 02/06/24 to see Branch. I did add her t the wait list as well

## 2024-01-19 ENCOUNTER — Other Ambulatory Visit: Payer: Self-pay | Admitting: Cardiology

## 2024-02-06 ENCOUNTER — Encounter: Payer: Self-pay | Admitting: Nurse Practitioner

## 2024-02-06 ENCOUNTER — Other Ambulatory Visit: Payer: Self-pay

## 2024-02-06 ENCOUNTER — Emergency Department (HOSPITAL_COMMUNITY)

## 2024-02-06 ENCOUNTER — Ambulatory Visit: Payer: Medicare Other | Attending: Nurse Practitioner | Admitting: Nurse Practitioner

## 2024-02-06 ENCOUNTER — Emergency Department (HOSPITAL_COMMUNITY)
Admission: EM | Admit: 2024-02-06 | Discharge: 2024-02-06 | Disposition: A | Discharge status: Home / Self Care | Attending: Emergency Medicine | Admitting: Emergency Medicine

## 2024-02-06 ENCOUNTER — Encounter (HOSPITAL_COMMUNITY): Payer: Self-pay | Admitting: Emergency Medicine

## 2024-02-06 VITALS — Ht 62.0 in | Wt 205.6 lb

## 2024-02-06 DIAGNOSIS — I160 Hypertensive urgency: Secondary | ICD-10-CM

## 2024-02-06 DIAGNOSIS — R0609 Other forms of dyspnea: Secondary | ICD-10-CM | POA: Diagnosis not present

## 2024-02-06 DIAGNOSIS — R0789 Other chest pain: Secondary | ICD-10-CM | POA: Diagnosis not present

## 2024-02-06 DIAGNOSIS — I4821 Permanent atrial fibrillation: Secondary | ICD-10-CM

## 2024-02-06 DIAGNOSIS — R519 Headache, unspecified: Secondary | ICD-10-CM

## 2024-02-06 DIAGNOSIS — R110 Nausea: Secondary | ICD-10-CM

## 2024-02-06 DIAGNOSIS — Z79899 Other long term (current) drug therapy: Secondary | ICD-10-CM | POA: Diagnosis not present

## 2024-02-06 DIAGNOSIS — Z7984 Long term (current) use of oral hypoglycemic drugs: Secondary | ICD-10-CM | POA: Diagnosis not present

## 2024-02-06 DIAGNOSIS — R42 Dizziness and giddiness: Secondary | ICD-10-CM

## 2024-02-06 DIAGNOSIS — R002 Palpitations: Secondary | ICD-10-CM

## 2024-02-06 LAB — COMPREHENSIVE METABOLIC PANEL
ALT: 12 U/L (ref 0–44)
AST: 17 U/L (ref 15–41)
Albumin: 3.9 g/dL (ref 3.5–5.0)
Alkaline Phosphatase: 122 U/L (ref 38–126)
Anion gap: 13 (ref 5–15)
BUN: 14 mg/dL (ref 8–23)
CO2: 24 mmol/L (ref 22–32)
Calcium: 9.4 mg/dL (ref 8.9–10.3)
Chloride: 103 mmol/L (ref 98–111)
Creatinine, Ser: 0.89 mg/dL (ref 0.44–1.00)
GFR, Estimated: >60 mL/min (ref >60)
Glucose, Bld: 135 mg/dL — ABNORMAL HIGH (ref 70–99)
Potassium: 3.7 mmol/L (ref 3.5–5.1)
Sodium: 140 mmol/L (ref 135–145)
Total Bilirubin: 1.2 mg/dL (ref 0.0–1.2)
Total Protein: 7.1 g/dL (ref 6.5–8.1)

## 2024-02-06 LAB — CBC
HCT: 45.7 % (ref 36.0–46.0)
Hemoglobin: 15.2 g/dL — ABNORMAL HIGH (ref 12.0–15.0)
MCH: 31.5 pg (ref 26.0–34.0)
MCHC: 33.3 g/dL (ref 30.0–36.0)
MCV: 94.6 fL (ref 80.0–100.0)
Platelets: 230 10*3/uL (ref 150–400)
RBC: 4.83 MIL/uL (ref 3.87–5.11)
RDW: 12.9 % (ref 11.5–15.5)
WBC: 7.6 10*3/uL (ref 4.0–10.5)
nRBC: 0 % (ref 0.0–0.2)

## 2024-02-06 LAB — TROPONIN I (HIGH SENSITIVITY): Troponin I (High Sensitivity): 10 ng/L (ref <18)

## 2024-02-06 MED ORDER — DILTIAZEM HCL ER BEADS 240 MG PO CP24
360.0000 mg | ORAL_CAPSULE | Freq: Every day | ORAL | Status: DC
  Administered 2024-02-06: 360 mg via ORAL
  Filled 2024-02-06 (×2): qty 1

## 2024-02-06 MED ORDER — HYDRALAZINE HCL 20 MG/ML IJ SOLN
20.0000 mg | INTRAMUSCULAR | Status: AC
  Administered 2024-02-06: 20 mg via INTRAVENOUS
  Filled 2024-02-06: qty 1

## 2024-02-06 MED ORDER — METOPROLOL SUCCINATE ER 25 MG PO TB24
25.0000 mg | ORAL_TABLET | ORAL | Status: AC
  Administered 2024-02-06: 25 mg via ORAL
  Filled 2024-02-06: qty 1

## 2024-02-06 MED ORDER — LISINOPRIL 10 MG PO TABS
20.0000 mg | ORAL_TABLET | Freq: Every day | ORAL | Status: DC
Start: 2024-02-06 — End: 2024-02-07
  Administered 2024-02-06: 20 mg via ORAL
  Filled 2024-02-06: qty 2

## 2024-02-06 MED ORDER — ACETAMINOPHEN 325 MG PO TABS
650.0000 mg | ORAL_TABLET | Freq: Once | ORAL | Status: AC
  Administered 2024-02-06: 650 mg via ORAL
  Filled 2024-02-06: qty 2

## 2024-02-06 NOTE — ED Triage Notes (Signed)
 Pt bib [facility] from [facility] for hypertension. Heart cares reading was 239/140. Pt also c/o of a headache.

## 2024-02-06 NOTE — Patient Instructions (Signed)
 Medication Instructions:  Your physician recommends that you continue on your current medications as directed. Please refer to the Current Medication list given to you today.  Labwork: None   Testing/Procedures: None   Follow-Up: Your physician recommends that you schedule a follow-up appointment in: 1-2 weeks after discharge sent to [facility]   Any Other Special Instructions Will Be Listed Below (If Applicable).  If you need a refill on your cardiac medications before your next appointment, please call your pharmacy.

## 2024-02-06 NOTE — Discharge Instructions (Signed)
 Thankfully your testing here has not shown any abnormalities, your blood pressure is much better, please make sure you are taking your medications exactly as prescribed  Thank you for allowing [place] to treat you in the emergency department today.  After reviewing your examination and potential testing that was done it appears that you are safe to go home.  I would like for you to follow-up with your doctor within the next several days, have them obtain your records and follow-up with them to review all potential tests and results from your visit.  If you should develop severe or worsening symptoms return to the emergency department immediately

## 2024-02-06 NOTE — Progress Notes (Signed)
 Office Visit    Patient Name: Andrea [name] Date of Encounter: 02/06/2024 PCP:  Ignatius Specking, MD [facility]  Cardiologist:  Dina Rich, MD  Advanced Practice Provider:  No care team member to display Electrophysiologist:  None   Chief Complaint and HPI    Andrea [name] is a 83 y.o. female with a hx of permanent A-fib, IDA d/t chronic blood loss (followed by Hematology), HTN, COPD, and OSA, who presents today for scheduled follow-up.   Last seen by Dr. Dina Rich on September 30, 2022. Was noted that she was not on AC d/t hx of GI bleeding, not a candidate for Watchman. Noted hx of noncompliance with CPAP machine. Was noted that pt had elevated HR and palpitations, but was noncompliant with medications. Lopressor changed to Toprol XL 50 mg daily.   Last seen in office on May 16, 2023.  She endorsed DOE, stable.  Admitted to ongoing dizziness, brief palpitations, denied any vertigo symptoms.  BP was well-controlled at home. Patient noted some weakness/difficulty with walking.  Today she presents for follow-up.  I was notified by LPN prior to entering office room today that BP was severely elevated, 220/140 with manual cuff. Patient is a poor historian d/t memory loss, has been ongoing for the past year per family member's report who is also present in the room. She admits to not feeling well at all. Admits to symptoms of possible UTI, says she feels nauseous, denies any vomiting, admits to severe headache.  Tells me she has not taking any of her medications this morning. Admits to some chest tightness, dizziness, and palpitations along with her symptoms. Has been short of breath with walking for the past 2-3 weeks, says this is new for her. Denies any  syncope, presyncope, orthopnea, PND, swelling or significant weight changes, acute bleeding, or claudication.  EKGs/Labs/Other Studies Reviewed:   The following studies were reviewed today:   EKG:   EKG  Interpretation Date/Time:  Monday February 06 2024 14:15:08 EDT Ventricular Rate:  94 PR Interval:    QRS Duration:  92 QT Interval:  366 QTC Calculation: 457 R Axis:   -14  Text Interpretation: Atrial fibrillation Septal infarct (cited on or before 23-Sep-2021) ST & T wave abnormality, consider inferior ischemia When compared with ECG of 23-Sep-2021 12:47, Vent. rate has increased BY  32 BPM Questionable change in initial forces of Septal leads QT has lengthened Confirmed by Sharlene Dory [phone]) on 02/06/2024 2:21:01 PM    Echo 07/2021:  1. Left ventricular ejection fraction, by estimation, is 55 to 60%. The  left ventricle has normal function. The left ventricle has no regional  wall motion abnormalities. There is moderate left ventricular hypertrophy.  Left ventricular diastolic  parameters are indeterminate. Elevated left atrial pressure.   2. Right ventricular systolic function is normal. The right ventricular  size is normal. There is mildly elevated pulmonary artery systolic  pressure.   3. Left atrial size was moderately dilated.   4. A small pericardial effusion is present. The pericardial effusion is  posterior to the left ventricle.   5. The mitral valve is grossly normal. Mild mitral valve regurgitation.   6. The aortic valve is tricuspid. There is mild calcification of the  aortic valve. Aortic valve regurgitation is mild. No aortic stenosis is  present.   7. The inferior vena cava is normal in size with greater than 50%  respiratory variability, suggesting right atrial pressure of 3 mmHg.  Comparison(s): Prior images reviewed side by side. No significant change  in LVEF. Small posterior pericardial effusion present previously.  Review of Systems    All other systems reviewed and are otherwise negative except as noted above.  Physical Exam    VS:  Ht 5\' 2"  (1.575 m)   Wt 205 lb 9.6 oz (93.3 kg)   BMI 37.60 kg/m  , BMI Body mass index is 37.6 kg/m.  Wt Readings  from Last 3 Encounters:  02/06/24 205 lb 9.6 oz (93.3 kg)  05/16/23 205 lb 9.6 oz (93.3 kg)  03/31/23 217 lb 12.8 oz (98.8 kg)     GEN: Obese, 83 y.o. female in no acute distress. HEENT: normal. Neck: Supple, no JVD, carotid bruits, or masses. Cardiac: S1/S2, irregular rhythm and regular rate, no murmurs, rubs, or gallops. No clubbing, cyanosis, edema.  Radials/PT 2+ and equal bilaterally.  Respiratory:  Respirations regular and unlabored, clear to auscultation bilaterally. GI: Soft, nontender, nondistended. MS: No deformity or atrophy. Skin: Warm and dry, no rash. Neuro:  Strength and sensation are intact. Psych: Normal affect.  Assessment & Plan    Hypertensive Urgency? Could lead to emergency Chest tightness, DOE Severe headache Nausea Dizziness Palpitations A-fib Possible UTI? BP severely elevated today, has not taken her BP medications. Repeat manual BP on exam 220/140. No acute ischemic changes on EKG. Admits to several concerning symptoms along with her elevated BP - severe headache, nausea, chest tightness, palpitations, and dizziness that warrant further evaluation today in the ED. Discussed outpatient vs inpatient management and risks involved of outpatient management and pt is agreeable to be seen in ED today. This NP and CMA Sharen Hones) stayed with patient until pt was transported by EMTs.    Disposition: Follow up in 1-2 weeks post hospital d/c with Dina Rich, MD or APP.  Signed, Sharlene Dory, NP 02/06/2024, 2:20 PM [facility]

## 2024-02-06 NOTE — ED Provider Notes (Signed)
 [facility] AT [facility] Provider Note   CSN: [id number] Arrival date & time: 02/06/24  1536     History  Chief Complaint  Patient presents with   Hypertension    Andrea [name] is a 83 y.o. female.   Hypertension  This patient is an 83 year old female, she has a history of atrial fibrillation on diltiazem,, lisinopril, metoprolol, she is also on metformin.  She had presented to her cardiologist office today for her routine follow-up regarding a cardiac monitor that she recently wore.  According to the notes from the office the patient was endorsing dyspnea on exertion, dizziness for over a year, palpitations that seem to come and go and states she has not been eating or drinking very much.  She has not been taking her medications regularly and did not take her blood pressure medicines today.  She had an echocardiogram in September 2022 which showed a preserved ejection fraction.  In the office the patient was complaining of headache and dizziness, it was recommended that she come to the ED for evaluation of her headache and dizziness.  When I talk to the patient today she states that she has headaches every day, she was found to be hypertensive in the office at well over 200 over well over 100 and because of this it was reported that she needed to come to the hospital.  According to the note the blood pressure was 145/91.  The patient denies dysuria or diarrhea but has some intermittent abdominal pain and nausea going on over months     Home Medications Prior to Admission medications   Medication Sig Start Date End Date Taking? Authorizing Provider  acetaminophen (TYLENOL) 500 MG tablet Take 1,000 mg by mouth every 6 (six) hours as needed for mild pain.    [provider]  BREO ELLIPTA 100-25 MCG/INH AEPB Inhale 1 puff into the lungs daily.  07/12/16   [provider]  diltiazem (TIAZAC) 360 MG 24 hr capsule Take 360 mg by mouth  daily. 01/31/23   [provider]  ipratropium-albuterol (DUONEB) 0.5-2.5 (3) MG/3ML SOLN Take 3 mLs by nebulization every 4 (four) hours as needed. 10/24/20   Osvaldo Shipper, MD  lisinopril (ZESTRIL) 20 MG tablet Take 1 tablet by mouth once daily Patient not taking: Reported on 02/06/2024 02/17/23   Antoine Poche, MD  loratadine (CLARITIN) 10 MG tablet Take 10 mg by mouth daily. 02/01/23   [provider]  meclizine (ANTIVERT) 12.5 MG tablet Take 12.5 mg by mouth 3 (three) times daily as needed. 01/19/24   [provider]  metFORMIN (GLUCOPHAGE) 850 MG tablet Take 1 tablet by mouth daily.    [provider]  metoprolol succinate (TOPROL-XL) 50 MG 24 hr tablet TAKE 1 TABLET BY MOUTH ONCE DAILY TAKE  WITH  OR  IMMEDIATELY  FOLLOWING  A  MEAL 01/19/24   Antoine Poche, MD  OXYGEN Inhale 3 L into the lungs at bedtime.    [provider]  piroxicam (FELDENE) 10 MG capsule TAKE 1 CAPSULE BY MOUTH ONCE DAILY AS NEEDED FOR JOINT PAIN    [provider]  prednisoLONE acetate (PRED FORTE) 1 % ophthalmic suspension Place 1 drop into the left eye 4 (four) times daily. Patient not taking: Reported on 02/06/2024 02/01/23   [provider]  tetrahydrozoline-zinc (VISINE-AC) 0.05-0.25 % ophthalmic solution Place 2 drops into both eyes 3 (three) times daily as needed (Red Itchy Eyes). Patient not taking: Reported  on 02/06/2024    [provider]  Vitamin D, Ergocalciferol, (DRISDOL) 1.25 MG (50000 UNIT) CAPS capsule Take 1 capsule (50,000 Units total) by mouth every 30 (thirty) days. Patient not taking: Reported on 02/06/2024 09/28/23   Carnella [place], PA-C      Allergies    Sulfa antibiotics and Shellfish allergy    Review of Systems   Review of Systems  All other systems reviewed and are negative.   Physical Exam Updated Vital Signs BP (!) 140/78   Pulse 76   Temp 98.5 F (36.9 C) (Oral)   Resp 20   Ht 1.575 m (5\' 2" )    Wt 93 kg   SpO2 95%   BMI 37.50 kg/m  Physical Exam Vitals and nursing note reviewed.  Constitutional:      General: She is not in acute distress.    Appearance: She is well-developed.  HENT:     Head: Normocephalic and atraumatic.     Nose: Nose normal. No congestion or rhinorrhea.     Mouth/Throat:     Pharynx: No oropharyngeal exudate.  Eyes:     General: No scleral icterus.       Right eye: No discharge.        Left eye: No discharge.     Conjunctiva/sclera: Conjunctivae normal.     Pupils: Pupils are equal, round, and reactive to light.  Neck:     Thyroid: No thyromegaly.     Vascular: No JVD.  Cardiovascular:     Rate and Rhythm: Normal rate and regular rhythm.     Heart sounds: Normal heart sounds. No murmur heard.    No friction rub. No gallop.  Pulmonary:     Effort: Pulmonary effort is normal. No respiratory distress.     Breath sounds: Normal breath sounds. No wheezing or rales.  Abdominal:     General: Bowel sounds are normal. There is no distension.     Palpations: Abdomen is soft. There is no mass.     Tenderness: There is no abdominal tenderness.  Musculoskeletal:        General: No tenderness. Normal range of motion.     Cervical back: Normal range of motion and neck supple.  Lymphadenopathy:     Cervical: No cervical adenopathy.  Skin:    General: Skin is warm and dry.     Findings: No erythema or rash.  Neurological:     General: No focal deficit present.     Mental Status: She is alert.     Coordination: Coordination normal.     Comments: The patient is able to answer my questions, she moves all 4 extremities and has equal bilateral leg strength with straight leg raise, she has good grips bilaterally and can sit up in the bed without difficulty, she has normal coordination and her cranial nerves III through XII are normal, speech is normal, memory is intact  Psychiatric:        Behavior: Behavior normal.     ED Results / Procedures / Treatments    Labs (all labs ordered are listed, but only abnormal results are displayed) Labs Reviewed  CBC - Abnormal; Notable for the following components:      Result Value   Hemoglobin 15.2 (*)    All other components within normal limits  COMPREHENSIVE METABOLIC PANEL - Abnormal; Notable for the following components:   Glucose, Bld 135 (*)    All other components within normal limits    EKG None  Radiology CT Head Wo Contrast Result Date: 02/06/2024 CLINICAL DATA:  Altered mental status EXAM: CT HEAD WITHOUT CONTRAST TECHNIQUE: Contiguous axial images were obtained from the base of the skull through the vertex without intravenous contrast. RADIATION DOSE REDUCTION: This exam was performed according to the departmental dose-optimization program which includes automated exposure control, adjustment of the mA and/or kV according to patient size and/or use of iterative reconstruction technique. COMPARISON:  None Available. FINDINGS: Brain: There is no mass, hemorrhage or extra-axial collection. There is generalized atrophy without lobar predilection. Hypodensity of the white matter is most commonly associated with chronic microvascular disease. Vascular: Atherosclerotic calcification of the internal carotid arteries at the skull base. No abnormal hyperdensity of the major intracranial arteries or dural venous sinuses. Skull: The visualized skull base, calvarium and extracranial soft tissues are normal. Sinuses/Orbits: No fluid levels or advanced mucosal thickening of the visualized paranasal sinuses. No mastoid or middle ear effusion. Normal orbits. Other: None. IMPRESSION: 1. No acute intracranial abnormality. 2. Generalized atrophy and findings of chronic microvascular disease. Electronically Signed   By: Deatra Robinson M.D.   On: 02/06/2024 16:56    Procedures Procedures    Medications Ordered in ED Medications  diltiazem (TIAZAC) 24 hr capsule 360 mg (360 mg Oral Given 02/06/24 1800)  lisinopril  (ZESTRIL) tablet 20 mg (20 mg Oral Given 02/06/24 1616)  acetaminophen (TYLENOL) tablet 650 mg (has no administration in time range)  metoprolol succinate (TOPROL-XL) 24 hr tablet 25 mg (25 mg Oral Given 02/06/24 1616)  hydrALAZINE (APRESOLINE) injection 20 mg (20 mg Intravenous Given by Other 02/06/24 2032)    ED Course/ Medical Decision Making/ A&P                                 Medical Decision Making Amount and/or Complexity of Data Reviewed Labs: ordered. Radiology: ordered. ECG/medicine tests: ordered.  Risk OTC drugs. Prescription drug management.    This patient presents to the ED for concern of known A-fib, here with hypertension and ongoing A-fib with vague complaints of abdominal discomfort and Zofran, this involves an extensive number of treatment options, and is a complaint that carries with it a high risk of complications and morbidity.  The differential diagnosis includes brain tumor, mass, seems less likely to be stroke, states that she has not been worked up for her headaches.  Will obtain labs and CT head   Co morbidities that complicate the patient evaluation  Atrial fibrillation, chronic headaches   Additional history obtained:  Additional history obtained from electronic medical record External records from outside source obtained and reviewed including cardiology notes   Lab Tests:  I Ordered, and personally interpreted labs.  The pertinent results include:     Imaging Studies ordered:  I ordered imaging studies including CT scan of the brain I independently visualized and interpreted imaging which showed no acute findings I agree with the radiologist interpretation   Cardiac Monitoring: / EKG:  The patient was maintained on a cardiac monitor.  I personally viewed and interpreted the cardiac monitored which showed an underlying rhythm of: Normal sinus rhythm   Problem List / ED Course / Critical interventions / Medication management  Blood  pressure has improved significantly, medications given both home medications and some here I ordered medication including hydralazine diltiazem lisinopril and metoprolol as well as Tylenol for the headache for hyper tension and headache Reevaluation of the patient after these medicines showed  that the patient improved, blood pressure at discharge is 140/78 and she feels better I have reviewed the patients home medicines and have made adjustments as needed   Social Determinants of Health:  Elderly   Test / Admission - Considered:  Stable for discharge, family come to pick her up, considered admission but her blood pressure improved significantly and her symptoms essentially resolved with         Final Clinical Impression(s) / ED Diagnoses Final diagnoses:  Hypertensive urgency  Frontal headache    Rx / DC Orders ED Discharge Orders     None         Eber Hong, MD 02/06/24 2119

## 2024-02-13 ENCOUNTER — Telehealth: Payer: Self-pay | Admitting: Cardiology

## 2024-02-13 ENCOUNTER — Other Ambulatory Visit: Payer: Self-pay | Admitting: Cardiology

## 2024-02-13 NOTE — Telephone Encounter (Signed)
 Patient is requesting a callback on what medications she should currently be taking for her heart.  Please advise.

## 2024-02-13 NOTE — Telephone Encounter (Signed)
 Left message to return call

## 2024-02-16 NOTE — Telephone Encounter (Signed)
 Left message for patient to return call.

## 2024-02-20 NOTE — Telephone Encounter (Signed)
 Left patient voicemail  MyChart message sent to patient with most recent medication list we have.

## 2024-04-17 ENCOUNTER — Ambulatory Visit: Attending: Nurse Practitioner | Admitting: Nurse Practitioner

## 2024-04-17 ENCOUNTER — Encounter: Payer: Self-pay | Admitting: Nurse Practitioner

## 2024-04-17 VITALS — BP 128/84 | HR 53 | Ht 62.5 in | Wt 207.0 lb

## 2024-04-17 DIAGNOSIS — I4821 Permanent atrial fibrillation: Secondary | ICD-10-CM | POA: Diagnosis not present

## 2024-04-17 DIAGNOSIS — I10 Essential (primary) hypertension: Secondary | ICD-10-CM

## 2024-04-17 DIAGNOSIS — E669 Obesity, unspecified: Secondary | ICD-10-CM

## 2024-04-17 DIAGNOSIS — J449 Chronic obstructive pulmonary disease, unspecified: Secondary | ICD-10-CM | POA: Diagnosis not present

## 2024-04-17 NOTE — Patient Instructions (Addendum)
 Medication Instructions:  Your physician has recommended you make the following change in your medication:  Please take your Diltiazem  in the Morning  Please take your Candesartan at Lunch time  Please take your Metoprolol  in the evening   Labwork: None   Testing/Procedures: None   Follow-Up: Your physician recommends that you schedule a follow-up appointment in: 6 Months   Any Other Special Instructions Will Be Listed Below (If Applicable).  If you need a refill on your cardiac medications before your next appointment, please call your pharmacy.

## 2024-04-17 NOTE — Progress Notes (Signed)
 Office Visit    Patient Name: Andrea [name] Date of Encounter: 04/17/2024 PCP:  Orlena Bitters, MD [facility]  Cardiologist:  Armida Lander, MD  Advanced Practice Provider:  No care team member to display Electrophysiologist:  None   Chief Complaint and HPI    Andrea [name] is a 83 y.o. female with a hx of permanent A-fib, IDA d/t chronic blood loss (followed by Hematology), HTN, COPD, and OSA, who presents today for scheduled follow-up.   Last seen by Dr. Armida Lander on September 30, 2022. Was noted that she was not on AC d/t hx of GI bleeding, not a candidate for Watchman. Noted hx of noncompliance with CPAP machine. Was noted that pt had elevated HR and palpitations, but was noncompliant with medications. Lopressor  changed to Toprol  XL 50 mg daily.   Last seen in office on May 16, 2023.  She endorsed DOE, stable.  Admitted to ongoing dizziness, brief palpitations, denied any vertigo symptoms.  BP was well-controlled at home. Patient noted some weakness/difficulty with walking.  02/06/2024 - Today she presents for follow-up.  I was notified by LPN prior to entering office room today that BP was severely elevated, 220/140 with manual cuff. Patient is a poor historian d/t memory loss, has been ongoing for the past year per family member's report who is also present in the room. She admits to not feeling well at all. Admits to symptoms of possible UTI, says she feels nauseous, denies any vomiting, admits to severe headache.  Tells me she has not taking any of her medications this morning. Admits to some chest tightness, dizziness, and palpitations along with her symptoms. Has been short of breath with walking for the past 2-3 weeks, says this is new for her. Denies any  syncope, presyncope, orthopnea, PND, swelling or significant weight changes, acute bleeding, or claudication.  ED visit on 02/06/2024 due to hypertensive urgency. CT of head was performed d/t  headache and was negative for anything acute, showed generalized atrophy and chronic microvascular disease. Was given IV hydralazine  in addition to other PO medications to bring down her BP.   04/17/2024 - Today she presents for follow-up. Doing much better. PCP adjusted her medications and timing of it and says her medication regimen is doing a good job controlling her BP. Says she has some forgetfulness of timing of her medications. Denies any chest pain, shortness of breath, palpitations, syncope, presyncope, dizziness, orthopnea, PND, swelling or significant weight changes, acute bleeding, or claudication.   EKGs/Labs/Other Studies Reviewed:   The following studies were reviewed today:   EKG:   EKG Interpretation Date/Time:  Tuesday Apr 17 2024 15:15:10 EDT Ventricular Rate:  53 PR Interval:    QRS Duration:  80 QT Interval:  466 QTC Calculation: 437 R Axis:   37  Text Interpretation: Atrial fibrillation with slow ventricular response Low voltage QRS Septal infarct , age undetermined Possible Lateral infarct , age undetermined ST & T wave abnormality, consider inferior ischemia When compared with ECG of 06-Feb-2024 21:32, PREVIOUS ECG IS PRESENT Confirmed by Lasalle Pointer [phone]) on 04/17/2024 3:29:37 PM   Echo 07/2021:  1. Left ventricular ejection fraction, by estimation, is 55 to 60%. The  left ventricle has normal function. The left ventricle has no regional  wall motion abnormalities. There is moderate left ventricular hypertrophy.  Left ventricular diastolic  parameters are indeterminate. Elevated left atrial pressure.   2. Right ventricular systolic function is normal. The right ventricular  size is normal. There is mildly elevated pulmonary artery systolic  pressure.   3. Left atrial size was moderately dilated.   4. A small pericardial effusion is present. The pericardial effusion is  posterior to the left ventricle.   5. The mitral valve is grossly normal. Mild mitral  valve regurgitation.   6. The aortic valve is tricuspid. There is mild calcification of the  aortic valve. Aortic valve regurgitation is mild. No aortic stenosis is  present.   7. The inferior vena cava is normal in size with greater than 50%  respiratory variability, suggesting right atrial pressure of 3 mmHg.   Comparison(s): Prior images reviewed side by side. No significant change  in LVEF. Small posterior pericardial effusion present previously.  Review of Systems    All other systems reviewed and are otherwise negative except as noted above.  Physical Exam    VS:  BP 128/84 (BP Location: Right Arm, Patient Position: Sitting, Cuff Size: Large)   Pulse (!) 53   Ht 5' 2.5" (1.588 m)   Wt 207 lb (93.9 kg)   SpO2 97%   BMI 37.26 kg/m  , BMI Body mass index is 37.26 kg/m.  Wt Readings from Last 3 Encounters:  04/17/24 207 lb (93.9 kg)  02/06/24 205 lb 0.4 oz (93 kg)  02/06/24 205 lb 9.6 oz (93.3 kg)     GEN: Obese, 83 y.o. female in no acute distress. HEENT: normal. Neck: Supple, no JVD, carotid bruits, or masses. Cardiac: S1/S2, irregular rhythm and regular rate, no murmurs, rubs, or gallops. No clubbing, cyanosis, edema.  Radials/PT 2+ and equal bilaterally.  Respiratory:  Respirations regular and unlabored, clear to auscultation bilaterally. GI: Soft, nontender, nondistended. MS: No deformity or atrophy. Skin: Warm and dry, no rash. Neuro:  Strength and sensation are intact. Psych: Normal affect.  Assessment & Plan    HTN BP stable and at goal. Discussed to monitor BP at home at least 2 hours after medications and sitting for 5-10 minutes. Went over and reviewed medication schedule. She verbalized understanding. No medication changes at this time. Heart healthy diet encouraged.   Permanent A-fib Denies any tachycardia or palpitations. HR is well controlled. No medication changes at this time. Not on OAC d/t her chronic anemia and hx of GI bleed.   COPD Denies any  shortness of breath or recent symptoms. Continue to follow-up with PCP.   Obesity Weight loss via diet and exercise encouraged. Discussed the impact being overweight would have on cardiovascular risk.  Disposition: Follow up in 6 months or sooner if needed with Armida Lander, MD or APP.  Signed, Lasalle Pointer, NP

## 2024-07-03 ENCOUNTER — Other Ambulatory Visit: Payer: Self-pay | Admitting: *Deleted

## 2024-08-10 ENCOUNTER — Other Ambulatory Visit: Payer: Self-pay

## 2024-08-10 MED ORDER — METOPROLOL SUCCINATE ER 50 MG PO TB24: 50.0000 mg | ORAL_TABLET | Freq: Every day | ORAL | 3 refills | Status: AC
